# Patient Record
Sex: Female | Born: 1945 | Race: White | Hispanic: No | Smoking: Former smoker
Health system: Southern US, Community
[De-identification: ages and names within clinical notes are randomized; demographics above are authoritative.]

## PROBLEM LIST (undated history)

## (undated) DIAGNOSIS — Z9181 History of falling: Secondary | ICD-10-CM

## (undated) DIAGNOSIS — Z8719 Personal history of other diseases of the digestive system: Secondary | ICD-10-CM

## (undated) DIAGNOSIS — K5790 Diverticulosis of intestine, part unspecified, without perforation or abscess without bleeding: Secondary | ICD-10-CM

## (undated) DIAGNOSIS — F32A Depression, unspecified: Secondary | ICD-10-CM

## (undated) DIAGNOSIS — I509 Heart failure, unspecified: Secondary | ICD-10-CM

## (undated) DIAGNOSIS — M069 Rheumatoid arthritis, unspecified: Secondary | ICD-10-CM

## (undated) DIAGNOSIS — K759 Inflammatory liver disease, unspecified: Secondary | ICD-10-CM

## (undated) DIAGNOSIS — D649 Anemia, unspecified: Secondary | ICD-10-CM

## (undated) DIAGNOSIS — G510 Bell's palsy: Secondary | ICD-10-CM

## (undated) DIAGNOSIS — E7800 Pure hypercholesterolemia, unspecified: Secondary | ICD-10-CM

## (undated) DIAGNOSIS — M810 Age-related osteoporosis without current pathological fracture: Secondary | ICD-10-CM

## (undated) DIAGNOSIS — I10 Essential (primary) hypertension: Secondary | ICD-10-CM

## (undated) DIAGNOSIS — K219 Gastro-esophageal reflux disease without esophagitis: Secondary | ICD-10-CM

## (undated) DIAGNOSIS — R279 Unspecified lack of coordination: Secondary | ICD-10-CM

## (undated) DIAGNOSIS — M545 Low back pain, unspecified: Secondary | ICD-10-CM

## (undated) DIAGNOSIS — I639 Cerebral infarction, unspecified: Secondary | ICD-10-CM

## (undated) DIAGNOSIS — I219 Acute myocardial infarction, unspecified: Secondary | ICD-10-CM

## (undated) DIAGNOSIS — R269 Unspecified abnormalities of gait and mobility: Principal | ICD-10-CM

## (undated) DIAGNOSIS — R41841 Cognitive communication deficit: Secondary | ICD-10-CM

## (undated) DIAGNOSIS — N390 Urinary tract infection, site not specified: Secondary | ICD-10-CM

## (undated) DIAGNOSIS — R413 Other amnesia: Secondary | ICD-10-CM

## (undated) DIAGNOSIS — F329 Major depressive disorder, single episode, unspecified: Secondary | ICD-10-CM

## (undated) DIAGNOSIS — M1990 Unspecified osteoarthritis, unspecified site: Secondary | ICD-10-CM

## (undated) HISTORY — PX: APPENDECTOMY: SHX54

## (undated) HISTORY — PX: ESOPHAGEAL DILATION: SHX303

## (undated) HISTORY — PX: CHOLECYSTECTOMY: SHX55

## (undated) HISTORY — PX: CATARACT EXTRACTION: SUR2

## (undated) HISTORY — DX: Unspecified abnormalities of gait and mobility: R26.9

## (undated) HISTORY — PX: ABDOMINAL HYSTERECTOMY: SHX81

## (undated) HISTORY — PX: FRACTURE SURGERY: SHX138

## (undated) HISTORY — DX: Other amnesia: R41.3

## (undated) HISTORY — PX: TONSILLECTOMY: SUR1361

---

## 1999-11-04 ENCOUNTER — Encounter: Admission: RE | Admit: 1999-11-04 | Discharge: 2000-02-02 | Payer: Self-pay | Admitting: Internal Medicine

## 2000-03-05 ENCOUNTER — Encounter: Payer: Self-pay | Admitting: Gynecology

## 2000-03-05 ENCOUNTER — Encounter: Admission: RE | Admit: 2000-03-05 | Discharge: 2000-03-05 | Payer: Self-pay | Admitting: Gynecology

## 2001-03-22 ENCOUNTER — Other Ambulatory Visit: Admission: RE | Admit: 2001-03-22 | Discharge: 2001-03-22 | Payer: Self-pay | Admitting: Gynecology

## 2002-04-20 ENCOUNTER — Other Ambulatory Visit: Admission: RE | Admit: 2002-04-20 | Discharge: 2002-04-20 | Payer: Self-pay | Admitting: Gynecology

## 2002-11-15 ENCOUNTER — Encounter: Admission: RE | Admit: 2002-11-15 | Discharge: 2002-11-15 | Payer: Self-pay | Admitting: Rheumatology

## 2002-11-15 ENCOUNTER — Encounter: Payer: Self-pay | Admitting: Rheumatology

## 2002-11-17 ENCOUNTER — Ambulatory Visit (HOSPITAL_COMMUNITY): Admission: RE | Admit: 2002-11-17 | Discharge: 2002-11-17 | Payer: Self-pay | Admitting: Gastroenterology

## 2002-11-17 ENCOUNTER — Encounter (INDEPENDENT_AMBULATORY_CARE_PROVIDER_SITE_OTHER): Payer: Self-pay | Admitting: Specialist

## 2003-05-05 ENCOUNTER — Other Ambulatory Visit: Admission: RE | Admit: 2003-05-05 | Discharge: 2003-05-05 | Payer: Self-pay | Admitting: Gynecology

## 2003-06-01 ENCOUNTER — Encounter: Payer: Self-pay | Admitting: Gynecology

## 2003-06-01 ENCOUNTER — Encounter: Admission: RE | Admit: 2003-06-01 | Discharge: 2003-06-01 | Payer: Self-pay | Admitting: Gynecology

## 2004-03-21 ENCOUNTER — Ambulatory Visit (HOSPITAL_COMMUNITY): Admission: RE | Admit: 2004-03-21 | Discharge: 2004-03-21 | Payer: Self-pay | Admitting: Neurology

## 2004-03-27 ENCOUNTER — Ambulatory Visit (HOSPITAL_COMMUNITY): Admission: RE | Admit: 2004-03-27 | Discharge: 2004-03-27 | Payer: Self-pay | Admitting: Neurology

## 2004-05-06 ENCOUNTER — Other Ambulatory Visit: Admission: RE | Admit: 2004-05-06 | Discharge: 2004-05-06 | Payer: Self-pay | Admitting: Gynecology

## 2005-04-10 ENCOUNTER — Encounter: Admission: RE | Admit: 2005-04-10 | Discharge: 2005-04-10 | Payer: Self-pay | Admitting: Gynecology

## 2005-05-07 ENCOUNTER — Other Ambulatory Visit: Admission: RE | Admit: 2005-05-07 | Discharge: 2005-05-07 | Payer: Self-pay | Admitting: Gynecology

## 2006-04-14 ENCOUNTER — Encounter: Admission: RE | Admit: 2006-04-14 | Discharge: 2006-04-14 | Payer: Self-pay | Admitting: Internal Medicine

## 2006-07-30 ENCOUNTER — Other Ambulatory Visit: Admission: RE | Admit: 2006-07-30 | Discharge: 2006-07-30 | Payer: Self-pay | Admitting: Gynecology

## 2007-07-26 ENCOUNTER — Encounter: Admission: RE | Admit: 2007-07-26 | Discharge: 2007-07-26 | Payer: Self-pay | Admitting: Internal Medicine

## 2007-08-09 ENCOUNTER — Other Ambulatory Visit: Admission: RE | Admit: 2007-08-09 | Discharge: 2007-08-09 | Payer: Self-pay | Admitting: Gynecology

## 2007-09-23 ENCOUNTER — Encounter: Admission: RE | Admit: 2007-09-23 | Discharge: 2007-09-23 | Payer: Self-pay | Admitting: Internal Medicine

## 2007-10-04 ENCOUNTER — Encounter: Admission: RE | Admit: 2007-10-04 | Discharge: 2007-10-04 | Payer: Self-pay | Admitting: Family Medicine

## 2007-10-27 ENCOUNTER — Ambulatory Visit (HOSPITAL_COMMUNITY): Admission: RE | Admit: 2007-10-27 | Discharge: 2007-10-27 | Payer: Self-pay | Admitting: Internal Medicine

## 2008-07-03 ENCOUNTER — Ambulatory Visit (HOSPITAL_BASED_OUTPATIENT_CLINIC_OR_DEPARTMENT_OTHER): Admission: RE | Admit: 2008-07-03 | Discharge: 2008-07-03 | Payer: Self-pay | Admitting: *Deleted

## 2008-07-06 ENCOUNTER — Inpatient Hospital Stay (HOSPITAL_BASED_OUTPATIENT_CLINIC_OR_DEPARTMENT_OTHER): Admission: RE | Admit: 2008-07-06 | Discharge: 2008-07-06 | Payer: Self-pay | Admitting: *Deleted

## 2008-09-25 ENCOUNTER — Encounter: Admission: RE | Admit: 2008-09-25 | Discharge: 2008-09-25 | Payer: Self-pay | Admitting: Internal Medicine

## 2010-01-08 ENCOUNTER — Encounter: Admission: RE | Admit: 2010-01-08 | Discharge: 2010-01-08 | Payer: Self-pay | Admitting: Internal Medicine

## 2010-06-22 ENCOUNTER — Emergency Department (HOSPITAL_COMMUNITY): Admission: EM | Admit: 2010-06-22 | Discharge: 2010-06-22 | Payer: Self-pay | Admitting: Emergency Medicine

## 2010-06-25 ENCOUNTER — Encounter: Admission: RE | Admit: 2010-06-25 | Discharge: 2010-06-25 | Payer: Self-pay | Admitting: Internal Medicine

## 2011-01-07 NOTE — Cardiovascular Report (Signed)
NAME:  Tracey Mason, Tracey Mason NO.:  [id number]   MEDICAL RECORD NO.:  [id number]          PATIENT TYPE:  OIB   LOCATION:  1961                         FACILITY:  [facility]   PHYSICIAN:  Elmore Guise., M.D.DATE OF BIRTH:  [birth year]   DATE OF PROCEDURE:  07/06/2008  DATE OF DISCHARGE:                            CARDIAC CATHETERIZATION   INDICATIONS FOR PROCEDURE:  Equivocal stress test, abnormal EKG, new  onset chest pain.   HISTORY OF PRESENT ILLNESS:  Tracey Mason is a very pleasant 65 year old  white female with past medical history of diabetes mellitus,  hypertension, dyslipidemia, tobacco dependence who presented for  evaluation of chest pain.  The patient had an equivocal stress test back  in 2004.  Her ECG was abnormal.  Because of her risk factors and  symptoms, she was referred for a cardiac catheterization.   DESCRIPTION OF PROCEDURE:  The patient presented to the cath lab in a  fasting state.  After appropriate informed consent, she was prepped and  draped in sterile fashion.  Approximately 10 mL of 1% lidocaine was used  for local anesthesia.  A 4-French sheath was placed in the right femoral  artery without difficulty.  Coronary angiography, LV angiography were  then performed.  The patient tolerated the procedure well with no  apparent complications.  The patient was transferred from the cardiac  cath lab in stable condition.   FINDINGS:  1. Left main normal.  2. LAD:  Moderate-sized vessel with mild luminal irregularities.  3. D1/D2:  Small vessels with mild luminal irregularities.  4. LCX:  Nondominant with mild luminal irregularities.  5. OM-1/OM-2:  Small-to-moderate-sized vessels with mild luminal      irregularities.  6. Ramus intermedius:  Moderate-sized vessel with mild luminal      irregularities.  7. RCA:  Dominant with mild luminal irregularities.  8. PDA and PLV:  Patent with mild luminal irregularities.  9. LV:  EF is 65%.  No wall  motion abnormalities.  LVEDP is 26 mmHg.   IMPRESSION:  1. Nonobstructive coronary arteries with mild luminal irregularities.  2. Preserved left ventricular systolic function, ejection fraction is      65% with no wall motion abnormalities.   PLAN:  At this time, I would recommend continued aggressive risk factor  modification as indicated.      Elmore Guise., M.D.  Electronically Signed    TWK/MEDQ  D:  07/06/2008  T:  07/06/2008  Job:  [id number]   cc:   Geoffry Paradise, M.D.

## 2011-01-10 NOTE — Op Note (Signed)
   NAME:  Tracey Mason, Tracey Mason                       ACCOUNT NO.:  [id number]   MEDICAL RECORD NO.:  [id number]                   PATIENT TYPE:  AMB   LOCATION:  ENDO                                 FACILITY:  [facility]   PHYSICIAN:  John C. Madilyn Fireman, M.D.                 DATE OF BIRTH:  09/25/1945   DATE OF PROCEDURE:  11/17/2002  DATE OF DISCHARGE:                                 OPERATIVE REPORT   PROCEDURE:  Colonoscopy with polypectomy.   INDICATION FOR PROCEDURE:  Family history of colon cancer in a first-degree  relative.   DESCRIPTION OF PROCEDURE:  The patient was placed in the left lateral  decubitus position and placed on the pulse monitor with continuous low-flow  oxygen delivered by nasal cannula.  She was sedated with 25 mcg IV fentanyl  and 2 mg IV Versed in addition to the medicine given for the previous EGD.  The Olympus video colonoscope was inserted into the rectum and advanced to  the cecum, confirmed by transillumination at McBurney's point and  visualization of the ileocecal valve and appendiceal orifice.  The prep was  excellent.  The cecum, ascending, transverse, descending, and sigmoid colon  all appeared normal with no masses, polyps, diverticula, or other mucosal  abnormalities.  Within the rectum, there was seen a 6 mm polyp at  approximately 12 cm from the anal verge, and this was fulgurated by hot  biopsy.  The remainder of the rectum appeared normal.  The colonoscope was  then withdrawn and the patient returned to the recovery room in stable  condition.  She tolerated the procedure well, and there were no immediate  complications.   IMPRESSION:  1. Small rectal polyp.  2. Otherwise, normal study.   PLAN:  Await histology and will repeat colonoscopy in five years.                                               John C. Madilyn Fireman, M.D.    JCH/MEDQ  D:  11/17/2002  T:  11/17/2002  Job:  [id number]   cc:   Geoffry Paradise, M.D.  [address]  [place]  [place]  [address]  Fax: [phone]

## 2011-01-10 NOTE — Op Note (Signed)
NAME:  Tracey Mason, Tracey Mason                       ACCOUNT NO.:  [id number]   MEDICAL RECORD NO.:  [id number]                   PATIENT TYPE:  AMB   LOCATION:  ENDO                                 [facility]ACILITY:  [facility]   PHYSICIAN:  John C. Madilyn Fireman, M.D.                 DATE OF BIRTH:  08-26-1945   DATE OF PROCEDURE:  11/17/2002  DATE OF DISCHARGE:                                 OPERATIVE REPORT   PROCEDURE:  Esophagogastroduodenoscopy.   INDICATION FOR PROCEDURE:  The patient undergoing colonoscopy for family  history of colon cancer who also has a family history of stomach cancer and  has had some dyspeptic symptoms and is known to have a lower esophageal  ring.  She has also had some intermittent dysphagia.  Procedure is to assess  for mucosal abnormalities of the upper GI tract, screen for malignancy, and  consider esophageal dilatation.   DESCRIPTION OF PROCEDURE:  The patient was placed in the left lateral  decubitus position and placed on the pulse monitor with continuous low-flow  oxygen delivered by nasal cannula.  She was sedated with 50 mg IV Demerol  and 5 mg IV Versed.  The Olympus video endoscope was advanced under direct  vision into the oropharynx and esophagus.  The esophagus was straight and of  normal caliber with the squamocolumnar line at 38 cm above a small 1 cm  hiatal hernia.  There did appear to be a very subtle lower esophageal ring  that could be seen when the LES was completely relaxed.  There was no  resistance to passage of the scope beyond it.  The stomach was entered, and  a small amount of liquid secretions were suctioned from the fundus.  Retroflexed view of the cardia was unremarkable.  The fundus and body  appeared normal.  The antrum showed a few patches of erythema and  granularity distally, consistent with mild gastritis with no focal erosions  or ulcers.  CLOtest was obtained.  The pylorus was nondeformed and easily  allowed passage of the endoscope  tip into the duodenum.  Both the bulb and  second portion were well-inspected and appeared to be within normal limits.  The Savary guidewire was placed through the endoscope channel and the scope  withdrawn.  A single 17 mm Savary dilator was passed over the guidewire with  minimal resistance and removed together with the wire.  There was no blood  seen on the dilator.  The patient was then prepared for colonoscopy.  She  tolerated the procedure well, and there were no immediate complications.   IMPRESSION:  1. 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  2. Mild gastritis.    PLAN:  1. Advance diet and observe response to dilatation.  2. We will await CLOtest and treat for eradication if Helicobacter positive     given her family history of gastric cancer.  John C. Madilyn Fireman, M.D.    JCH/MEDQ  D:  11/17/2002  T:  11/17/2002  Job:  [id number]   cc:   Geoffry Paradise, M.D.  [address]  [place]  [place] [address]  Fax: [phone]

## 2011-05-27 LAB — POCT I-STAT GLUCOSE
Glucose, Bld: 220 — ABNORMAL HIGH
Operator id: 141321

## 2011-08-04 ENCOUNTER — Encounter: Payer: Self-pay | Admitting: Emergency Medicine

## 2011-08-04 ENCOUNTER — Inpatient Hospital Stay (HOSPITAL_COMMUNITY)
Admission: AD | Admit: 2011-08-04 | Discharge: 2011-08-08 | DRG: 885 | Disposition: A | Discharge status: Home / Self Care | Payer: No Typology Code available for payment source | Source: Ambulatory Visit | Attending: Psychiatry | Admitting: Psychiatry

## 2011-08-04 ENCOUNTER — Encounter (HOSPITAL_COMMUNITY): Payer: Self-pay | Admitting: *Deleted

## 2011-08-04 ENCOUNTER — Emergency Department (HOSPITAL_COMMUNITY)
Admission: EM | Admit: 2011-08-04 | Discharge: 2011-08-04 | Disposition: A | Discharge status: Other Acute Inpatient Hospital | Payer: Medicare Other | Attending: Emergency Medicine | Admitting: Emergency Medicine

## 2011-08-04 DIAGNOSIS — M1990 Unspecified osteoarthritis, unspecified site: Secondary | ICD-10-CM | POA: Insufficient documentation

## 2011-08-04 DIAGNOSIS — Z8673 Personal history of transient ischemic attack (TIA), and cerebral infarction without residual deficits: Secondary | ICD-10-CM

## 2011-08-04 DIAGNOSIS — E119 Type 2 diabetes mellitus without complications: Secondary | ICD-10-CM | POA: Insufficient documentation

## 2011-08-04 DIAGNOSIS — R45851 Suicidal ideations: Secondary | ICD-10-CM

## 2011-08-04 DIAGNOSIS — K219 Gastro-esophageal reflux disease without esophagitis: Secondary | ICD-10-CM

## 2011-08-04 DIAGNOSIS — Z79899 Other long term (current) drug therapy: Secondary | ICD-10-CM

## 2011-08-04 DIAGNOSIS — I10 Essential (primary) hypertension: Secondary | ICD-10-CM

## 2011-08-04 DIAGNOSIS — M069 Rheumatoid arthritis, unspecified: Secondary | ICD-10-CM

## 2011-08-04 DIAGNOSIS — Z888 Allergy status to other drugs, medicaments and biological substances status: Secondary | ICD-10-CM

## 2011-08-04 DIAGNOSIS — IMO0002 Reserved for concepts with insufficient information to code with codable children: Secondary | ICD-10-CM

## 2011-08-04 DIAGNOSIS — K5730 Diverticulosis of large intestine without perforation or abscess without bleeding: Secondary | ICD-10-CM

## 2011-08-04 DIAGNOSIS — G4700 Insomnia, unspecified: Secondary | ICD-10-CM

## 2011-08-04 DIAGNOSIS — F332 Major depressive disorder, recurrent severe without psychotic features: Principal | ICD-10-CM

## 2011-08-04 DIAGNOSIS — F3289 Other specified depressive episodes: Secondary | ICD-10-CM | POA: Insufficient documentation

## 2011-08-04 DIAGNOSIS — Z7982 Long term (current) use of aspirin: Secondary | ICD-10-CM

## 2011-08-04 DIAGNOSIS — Z794 Long term (current) use of insulin: Secondary | ICD-10-CM | POA: Insufficient documentation

## 2011-08-04 DIAGNOSIS — F329 Major depressive disorder, single episode, unspecified: Secondary | ICD-10-CM

## 2011-08-04 HISTORY — DX: Major depressive disorder, single episode, unspecified: F32.9

## 2011-08-04 HISTORY — DX: Cerebral infarction, unspecified: I63.9

## 2011-08-04 HISTORY — DX: Essential (primary) hypertension: I10

## 2011-08-04 HISTORY — DX: Depression, unspecified: F32.A

## 2011-08-04 HISTORY — DX: Unspecified osteoarthritis, unspecified site: M19.90

## 2011-08-04 HISTORY — DX: Rheumatoid arthritis, unspecified: M06.9

## 2011-08-04 HISTORY — DX: Diverticulosis of intestine, part unspecified, without perforation or abscess without bleeding: K57.90

## 2011-08-04 HISTORY — DX: Gastro-esophageal reflux disease without esophagitis: K21.9

## 2011-08-04 HISTORY — DX: Bell's palsy: G51.0

## 2011-08-04 LAB — COMPREHENSIVE METABOLIC PANEL
ALT: 23 U/L (ref 0–35)
Albumin: 4.3 g/dL (ref 3.5–5.2)
CO2: 25 mEq/L (ref 19–32)
Chloride: 106 mEq/L (ref 96–112)
GFR calc non Af Amer: 75 mL/min — ABNORMAL LOW (ref >90)
Glucose, Bld: 101 mg/dL — ABNORMAL HIGH (ref 70–99)
Sodium: 143 mEq/L (ref 135–145)
Total Bilirubin: 0.4 mg/dL (ref 0.3–1.2)

## 2011-08-04 LAB — CBC
Hemoglobin: 12.8 g/dL (ref 12.0–15.0)
Platelets: 236 10*3/uL (ref 150–400)
RBC: 4.04 MIL/uL (ref 3.87–5.11)
RDW: 12.4 % (ref 11.5–15.5)
WBC: 8.4 10*3/uL (ref 4.0–10.5)

## 2011-08-04 LAB — URINALYSIS, ROUTINE W REFLEX MICROSCOPIC
Glucose, UA: NEGATIVE mg/dL
Hgb urine dipstick: NEGATIVE
pH: 6.5 (ref 5.0–8.0)

## 2011-08-04 LAB — RAPID URINE DRUG SCREEN, HOSP PERFORMED
Benzodiazepines: NOT DETECTED
Cocaine: NOT DETECTED
Tetrahydrocannabinol: NOT DETECTED

## 2011-08-04 LAB — ETHANOL: Alcohol, Ethyl (B): <11 mg/dL (ref 0–11)

## 2011-08-04 LAB — GLUCOSE, CAPILLARY: Glucose-Capillary: 78 mg/dL (ref 70–99)

## 2011-08-04 MED ORDER — LORAZEPAM 1 MG PO TABS
1.0000 mg | ORAL_TABLET | Freq: Three times a day (TID) | ORAL | Status: DC | PRN
  Administered 2011-08-04: 1 mg via ORAL
  Filled 2011-08-04: qty 1

## 2011-08-04 MED ORDER — ACETAMINOPHEN 325 MG PO TABS
650.0000 mg | ORAL_TABLET | ORAL | Status: DC | PRN
  Administered 2011-08-04: 650 mg via ORAL
  Filled 2011-08-04: qty 2

## 2011-08-04 NOTE — ED Notes (Signed)
Pt in no distress. Sitter at bedside. Pt states no plan of suicide but feels like she would be better off dead. Daughter at bedside.
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
## 2011-08-04 NOTE — ED Notes (Addendum)
Pt states that she has no pain and is much calmer after medications. She said she did have a headache before we medicated her and now it is gone. Pt is with sitter and daughter is at bedside. Pt given caffiene/sugar free soda

## 2011-08-04 NOTE — ED Notes (Signed)
Per family pt has been hysterical and threatens to hurt herself and doesn't want to live  Is a diabetic and she has hx of  depression

## 2011-08-04 NOTE — ED Provider Notes (Signed)
History     CSN: [id number] Arrival date & time: 08/04/2011  2:47 PM   First MD Initiated Contact with Patient 08/04/11 1510      Chief Complaint  Patient presents with  . Depression    (Consider location/radiation/quality/duration/timing/severity/associated sxs/prior treatment) The history is provided by the patient and a relative.  pt with hx depression is feeling more depressed in past couple weeks. Now having thoughts of suicide, not wanting to live. States spouse died 2 yrs ago, and grandchild yrs ago, states feels worse this time of year. Had been on cymbalta in past with some improvement in feelings of depression, but took self off med a year ago. Had a psychiatrist previous, but says that they have retired. Denies any recent acute health problems or other symptoms. No overdose or attempt at self harm. Trouble sleeping at night, often up to 3-4 am. Has withdrawn, quit going to church. Is eating and drinking, taking meds,  'going through the motions to live'.   Past Medical History  Diagnosis Date  . Diabetes mellitus   . Depression   . Hypertension   . Acid reflux   . Diverticular disease   . Rheumatoid arthritis   . Osteoarthritis   . Stroke   . Bell's palsy     Past Surgical History  Procedure Date  . Appendectomy   . Tonsillectomy   . Cholecystectomy   . Abdominal hysterectomy   . Fracture surgery     No family history on file.  History  Substance Use Topics  . Smoking status: Former Games developer  . Smokeless tobacco: Not on file  . Alcohol Use: Yes    OB History    Grav Para Term Preterm Abortions TAB SAB Ect Mult Living                  Review of Systems  Constitutional: Negative for fever and chills.  HENT: Negative for neck pain.   Respiratory: Negative for shortness of breath.   Cardiovascular: Negative for chest pain.  Gastrointestinal: Negative for abdominal pain.  Genitourinary: Negative for flank pain.  Musculoskeletal: Negative for back  pain.  Skin: Negative for rash.  Neurological: Negative for headaches.  Hematological: Does not bruise/bleed easily.  Psychiatric/Behavioral: Negative for hallucinations.    Allergies  Prednisone  Home Medications   Current Outpatient Rx  Name Route Sig Dispense Refill  . AMLODIPINE BESYLATE 5 MG PO TABS Oral Take 5 mg by mouth daily.      Marland Kitchen VITAMIN C 1000 MG PO TABS Oral Take 1,000 mg by mouth daily.      . ASPIRIN 81 MG PO TABS Oral Take 81 mg by mouth daily.      Marland Kitchen CALCIUM CITRATE-VITAMIN D 200-200 MG-UNIT PO TABS Oral Take 1 tablet by mouth 2 (two) times daily.      Marland Kitchen VITAMIN D 1000 UNITS PO TABS Oral Take 1,000 Units by mouth daily.      . INSULIN GLARGINE 100 UNIT/ML [place] SOLN Subcutaneous Inject 50 Units into the skin at bedtime.      . INSULIN GLULISINE 100 UNIT/ML [place] SOLN Subcutaneous Inject 10-30 Units into the skin 3 (three) times daily before meals. Sliding scale     . LISINOPRIL 20 MG PO TABS Oral Take 20 mg by mouth daily.      . MELOXICAM 15 MG PO TABS Oral Take 15 mg by mouth daily.      Marland Kitchen PANTOPRAZOLE SODIUM 40 MG PO TBEC Oral Take 40  mg by mouth daily.      Marland Kitchen SIMVASTATIN 40 MG PO TABS Oral Take 40 mg by mouth daily.        BP 179/102  Pulse 103  Temp(Src) 98 F (36.7 C) (Oral)  Resp 22  SpO2 98%  Physical Exam  Nursing note and vitals reviewed. Constitutional: She is oriented to person, place, and time. She appears well-developed and well-nourished. No distress.  HENT:  Head: Atraumatic.  Eyes: Conjunctivae are normal. Pupils are equal, round, and reactive to light. No scleral icterus.  Neck: Neck supple. No tracheal deviation present.  Cardiovascular: Normal rate, regular rhythm, normal heart sounds and intact distal pulses.   Pulmonary/Chest: Effort normal and breath sounds normal. No respiratory distress.  Abdominal: Soft. Normal appearance. There is no tenderness.  Musculoskeletal: She exhibits no edema.  Neurological: She is alert and oriented to  person, place, and time.       Steady gait  Skin: Skin is warm and dry. No rash noted.  Psychiatric:       Depressed. Tearful. +suicidal thoughts.     ED Course  Procedures (including critical care time)   Results for orders placed during the hospital encounter of 08/04/11  CBC      Component Value Range   WBC 8.4  4.0 - 10.5 (K/uL)   RBC 4.04  3.87 - 5.11 (MIL/uL)   Hemoglobin 12.8  12.0 - 15.0 (g/dL)   HCT 16.1  09.6 - 04.5 (%)   MCV 89.9  78.0 - 100.0 (fL)   MCH 31.7  26.0 - 34.0 (pg)   MCHC 35.3  30.0 - 36.0 (g/dL)   RDW 40.9  81.1 - 91.4 (%)   Platelets 236  150 - 400 (K/uL)  COMPREHENSIVE METABOLIC PANEL      Component Value Range   Sodium 143  135 - 145 (mEq/L)   Potassium 3.8  3.5 - 5.1 (mEq/L)   Chloride 106  96 - 112 (mEq/L)   CO2 25  19 - 32 (mEq/L)   Glucose, Bld 101 (*) 70 - 99 (mg/dL)   BUN 15  6 - 23 (mg/dL)   Creatinine, Ser 7.82  0.50 - 1.10 (mg/dL)   Calcium 95.6  8.4 - 10.5 (mg/dL)   Total Protein 8.1  6.0 - 8.3 (g/dL)   Albumin 4.3  3.5 - 5.2 (g/dL)   AST 22  0 - 37 (U/L)   ALT 23  0 - 35 (U/L)   Alkaline Phosphatase 100  39 - 117 (U/L)   Total Bilirubin 0.4  0.3 - 1.2 (mg/dL)   GFR calc non Af Amer 75 (*) >90 (mL/min)   GFR calc Af Amer 87 (*) >90 (mL/min)  URINALYSIS, ROUTINE W REFLEX MICROSCOPIC      Component Value Range   Color, Urine YELLOW  YELLOW    APPearance HAZY (*) CLEAR    Specific Gravity, Urine 1.006  1.005 - 1.030    pH 6.5  5.0 - 8.0    Glucose, UA NEGATIVE  NEGATIVE (mg/dL)   Hgb urine dipstick NEGATIVE  NEGATIVE    Bilirubin Urine NEGATIVE  NEGATIVE    Ketones, ur NEGATIVE  NEGATIVE (mg/dL)   Protein, ur NEGATIVE  NEGATIVE (mg/dL)   Urobilinogen, UA 0.2  0.0 - 1.0 (mg/dL)   Nitrite NEGATIVE  NEGATIVE    Leukocytes, UA SMALL (*) NEGATIVE   URINE RAPID DRUG SCREEN (HOSP PERFORMED)      Component Value Range   Opiates NONE DETECTED  NONE  DETECTED    Cocaine NONE DETECTED  NONE DETECTED    Benzodiazepines NONE DETECTED   NONE DETECTED    Amphetamines NONE DETECTED  NONE DETECTED    Tetrahydrocannabinol NONE DETECTED  NONE DETECTED    Barbiturates NONE DETECTED  NONE DETECTED   ETHANOL      Component Value Range   Alcohol, Ethyl (B) <11  0 - 11 (mg/dL)  URINE MICROSCOPIC-ADD ON      Component Value Range   Squamous Epithelial / LPF RARE  RARE    WBC, UA 0-2  <3 (WBC/hpf)   Bacteria, UA MANY (*) RARE   GLUCOSE, CAPILLARY      Component Value Range   Glucose-Capillary 78  70 - 99 (mg/dL)   No results found.    MDM  Labs sent. [facility] team called.    Act team indicates pt accepted at [facility], dr Rogers Blocker for walker, bed ready    Suzi Roots, MD 08/04/11 2011

## 2011-08-05 DIAGNOSIS — E119 Type 2 diabetes mellitus without complications: Secondary | ICD-10-CM

## 2011-08-05 LAB — GLUCOSE, CAPILLARY
Glucose-Capillary: 177 mg/dL — ABNORMAL HIGH (ref 70–99)
Glucose-Capillary: 252 mg/dL — ABNORMAL HIGH (ref 70–99)
Glucose-Capillary: 232 mg/dL — ABNORMAL HIGH (ref 70–99)

## 2011-08-05 MED ORDER — MELOXICAM 7.5 MG PO TABS
15.0000 mg | ORAL_TABLET | Freq: Every day | ORAL | Status: DC
  Administered 2011-08-05 – 2011-08-08 (×4): 15 mg via ORAL
  Filled 2011-08-05: qty 1
  Filled 2011-08-05: qty 14
  Filled 2011-08-05 (×4): qty 1

## 2011-08-05 MED ORDER — ACETAMINOPHEN 325 MG PO TABS: 650.0000 mg | ORAL_TABLET | Freq: Four times a day (QID) | ORAL | Status: DC | PRN

## 2011-08-05 MED ORDER — CALCIUM CARBONATE-VITAMIN D 250-125 MG-UNIT PO TABS
1.0000 | ORAL_TABLET | Freq: Every day | ORAL | Status: DC
  Administered 2011-08-05 – 2011-08-08 (×4): 1 via ORAL
  Filled 2011-08-05: qty 7
  Filled 2011-08-05 (×5): qty 1

## 2011-08-05 MED ORDER — TRAZODONE HCL 50 MG PO TABS
50.0000 mg | ORAL_TABLET | Freq: Every evening | ORAL | Status: DC | PRN
  Administered 2011-08-05 (×2): 50 mg via ORAL
  Filled 2011-08-05: qty 1
  Filled 2011-08-05: qty 7
  Filled 2011-08-05: qty 1

## 2011-08-05 MED ORDER — MAGNESIUM HYDROXIDE 400 MG/5ML PO SUSP: 30.0000 mL | Freq: Every day | ORAL | Status: DC | PRN

## 2011-08-05 MED ORDER — PANTOPRAZOLE SODIUM 40 MG PO TBEC
40.0000 mg | DELAYED_RELEASE_TABLET | Freq: Every day | ORAL | Status: DC
  Administered 2011-08-05 – 2011-08-08 (×4): 40 mg via ORAL
  Filled 2011-08-05 (×2): qty 1
  Filled 2011-08-05: qty 7
  Filled 2011-08-05 (×2): qty 1

## 2011-08-05 MED ORDER — AMLODIPINE BESYLATE 5 MG PO TABS
5.0000 mg | ORAL_TABLET | Freq: Every day | ORAL | Status: DC
  Administered 2011-08-05 – 2011-08-08 (×4): 5 mg via ORAL
  Filled 2011-08-05 (×3): qty 1
  Filled 2011-08-05: qty 14
  Filled 2011-08-05: qty 1

## 2011-08-05 MED ORDER — ASPIRIN EC 81 MG PO TBEC
81.0000 mg | DELAYED_RELEASE_TABLET | Freq: Every day | ORAL | Status: DC
  Administered 2011-08-05 – 2011-08-08 (×4): 81 mg via ORAL
  Filled 2011-08-05 (×2): qty 1
  Filled 2011-08-05: qty 7
  Filled 2011-08-05 (×2): qty 1

## 2011-08-05 MED ORDER — VITAMIN C 500 MG PO TABS
1000.0000 mg | ORAL_TABLET | Freq: Every day | ORAL | Status: DC
  Administered 2011-08-05 – 2011-08-08 (×4): 1000 mg via ORAL
  Filled 2011-08-05 (×2): qty 2
  Filled 2011-08-05: qty 14
  Filled 2011-08-05 (×2): qty 2

## 2011-08-05 MED ORDER — INSULIN GLARGINE 100 UNIT/ML ~~LOC~~ SOLN
50.0000 [IU] | Freq: Every day | SUBCUTANEOUS | Status: DC
  Administered 2011-08-05 – 2011-08-07 (×4): 50 [IU] via SUBCUTANEOUS
  Filled 2011-08-05: qty 3

## 2011-08-05 MED ORDER — DULOXETINE HCL 30 MG PO CPEP
30.0000 mg | ORAL_CAPSULE | Freq: Every day | ORAL | Status: DC
  Administered 2011-08-05 – 2011-08-08 (×4): 30 mg via ORAL
  Filled 2011-08-05 (×3): qty 1
  Filled 2011-08-05: qty 7
  Filled 2011-08-05 (×2): qty 1

## 2011-08-05 MED ORDER — ALUM & MAG HYDROXIDE-SIMETH 200-200-20 MG/5ML PO SUSP: 30.0000 mL | ORAL | Status: DC | PRN

## 2011-08-05 MED ORDER — VITAMIN D3 25 MCG (1000 UNIT) PO TABS
1000.0000 [IU] | ORAL_TABLET | Freq: Every day | ORAL | Status: DC
  Administered 2011-08-05 – 2011-08-08 (×4): 1000 [IU] via ORAL
  Filled 2011-08-05: qty 1
  Filled 2011-08-05: qty 7
  Filled 2011-08-05 (×4): qty 1

## 2011-08-05 MED ORDER — LISINOPRIL 20 MG PO TABS
20.0000 mg | ORAL_TABLET | Freq: Every day | ORAL | Status: DC
  Administered 2011-08-05 – 2011-08-08 (×4): 20 mg via ORAL
  Filled 2011-08-05: qty 7
  Filled 2011-08-05 (×4): qty 1

## 2011-08-05 MED ORDER — SIMVASTATIN 40 MG PO TABS
40.0000 mg | ORAL_TABLET | Freq: Every day | ORAL | Status: DC
  Administered 2011-08-05 – 2011-08-08 (×4): 40 mg via ORAL
  Filled 2011-08-05: qty 7
  Filled 2011-08-05 (×4): qty 1

## 2011-08-05 NOTE — H&P (Signed)
Psychiatric Admission Assessment Adult  Patient Identification:  Tracey Mason Date of Evaluation:  08/05/2011 Chief Complaint:  MDD, Recurrent, Severe  History of Present Illness: Ms. Tracey Mason is a 65 year old Caucasian female. She  Is admitted from [facility] ED. Patient reports, "I did not want to live any more. I did not see any sense in going on. I lost my newborn grandson in 2008-01-20. He was only 74 weeks old. The only son for one of my sons, whose wife can't have any more children due to a terrible illness. This baby was born healthy, some how, some milk got into his lungs, he caught pneumonia and died.  In 01-19-2010, I lost my husband from lung cancer after 35 years of a wonderful marriage. He was the love of my life. I miss him a lot. I also have a lot of stress in my life, a lot of health issues also. I got tired of hurting.  I stopped taking all my medicines, and here I am"  Mood Symptoms:  Anhedonia Depression Helplessness Hopelessness Past 2 Weeks Sadness SI Depression Symptoms:  depressed mood, anhedonia, insomnia, feelings of worthlessness/guilt, hopelessness, recurrent thoughts of death, suicidal thoughts without plan and insomnia ( Elevated Mood:  No Irritable Mood:  Yes Grandiosity:  No Distractibility:  No Labiality of Mood:  No Delusions:  No Hallucinations:  No Impulsivity:  No Sexually Inappropriate Behavior:  No Financial Extravagance:  No Flight of Ideas:  No  Anxiety Symptoms: Excessive Worry:  Yes Panic Symptoms:  No Agoraphobia:  No Obsessive Compulsive: No  Symptoms: None Specific Phobias:  No Social Anxiety:  No  Psychotic Symptoms:  Hallucinations:  None Delusions:  No Paranoia:  No   Ideas of Reference:  No  PTSD Symptoms: Ever had a traumatic exposure:  No Had a traumatic exposure in the last month:  No Re-experiencing:  None Hypervigilance:  No Hyperarousal:  None Avoidance:  None  Traumatic Brain Injury:  None reported  Past  Psychiatric History: Diagnosis:Depression, major  Hospitalizations:[facility]  Outpatient Care: [facility]  Substance Abuse Care:None reported  Self-Mutilation:None  Suicidal Attempts:None, but is with Hx. SI  Violent Behaviors:None reported   Past Medical History:   Past Medical History  Diagnosis Date  . Diabetes mellitus   . Depression   . Hypertension   . Acid reflux   . Diverticular disease   . Rheumatoid arthritis   . Osteoarthritis   . Stroke   . Bell's palsy    History of Loss of Consciousness:  No Seizure History:  No Cardiac History:  Yes  ROS: Reviewed and noted ROS from the ED. Allergies:   Allergies  Allergen Reactions  . Prednisone Other (See Comments)    Causes stroke   Current Medications:  Current Facility-Administered Medications  Medication Dose Route Frequency Provider Last Rate Last Dose  . acetaminophen (TYLENOL) tablet 650 mg  650 mg Oral Q6H PRN Verne Spurr, PA      . alum & mag hydroxide-simeth (MAALOX/MYLANTA) 200-200-20 MG/5ML suspension 30 mL  30 mL Oral Q4H PRN Verne Spurr, PA      . amLODipine (NORVASC) tablet 5 mg  5 mg Oral Daily Verne Spurr, PA   5 mg at 08/05/11 0826  . aspirin EC tablet 81 mg  81 mg Oral Daily Verne Spurr, PA   81 mg at 08/05/11 0827  . calcium-vitamin D (OSCAL WITH D) 250-125 MG-UNIT per tablet 1 tablet  1 tablet Oral Daily Verne Spurr, [place]   1  tablet at 08/05/11 0827  . cholecalciferol (VITAMIN D) tablet 1,000 Units  1,000 Units Oral Daily Verne Spurr, PA   1,000 Units at 08/05/11 [phone]  . insulin glargine (LANTUS) injection 50 Units  50 Units Subcutaneous QHS Verne Spurr, [place]   50 Units at 08/05/11 0204  . lisinopril (PRINIVIL,ZESTRIL) tablet 20 mg  20 mg Oral Daily Verne Spurr, [place]   20 mg at 08/05/11 9604  . magnesium hydroxide (MILK OF MAGNESIA) suspension 30 mL  30 mL Oral Daily PRN Verne Spurr, PA      . meloxicam (MOBIC) tablet 15 mg  15 mg Oral Daily Verne Spurr, [place]   15 mg at 08/05/11 5409  .  pantoprazole (PROTONIX) EC tablet 40 mg  40 mg Oral Q1200 Verne Spurr, PA   40 mg at 08/05/11 1155  . simvastatin (ZOCOR) tablet 40 mg  40 mg Oral q1800 Verne Spurr, PA      . traZODone (DESYREL) tablet 50 mg  50 mg Oral QHS PRN Verne Spurr, PA   50 mg at 08/05/11 0206  . vitamin C (ASCORBIC ACID) tablet 1,000 mg  1,000 mg Oral Daily Verne Spurr, PA   1,000 mg at 08/05/11 0830   Facility-Administered Medications Ordered in Other Encounters  Medication Dose Route Frequency Provider Last Rate Last Dose  . DISCONTD: acetaminophen (TYLENOL) tablet 650 mg  650 mg Oral Q4H PRN Suzi Roots, MD   650 mg at 08/04/11 1827  . DISCONTD: LORazepam (ATIVAN) tablet 1 mg  1 mg Oral Q8H PRN Suzi Roots, MD   1 mg at 08/04/11 1826    Previous Psychotropic Medications:  Medication Dose                        Substance Abuse History in the last 12 months: Substance Age of 1st Use Last Use Amount Specific Type  Nicotine "I quit smoking"     Alcohol "I drink some alcohol"     Cannabis Denies use     Opiates Denies use     Cocaine Denies use     Methamphetamines Denies use     LSD Denies use     Ecstasy Denies use     Benzodiazepines "I take ativan"     Caffeine      Inhalants      Others:                          Blackouts:  No DT's:  No Withdrawal Symptoms:  None  Social History: Current Place of Residence:  Personnel officer of Birth: [place]  Family Members: 2 sons, a daughter Marital Status:  Widowed    Relationships: Children     History of Abuse (Emotional/Phsycial/Sexual): None reported Occupational Experiences; retired  Family History:  Major depressive disorder - Mother  Mental Status Examination/Evaluation: Objective:  Appearance: Well Groomed  Patent attorney::  Good  Speech:  Clear and Coherent  Volume:  Normal  Mood:  "I feel useless right now"  Affect:  Flat  Thought Process:  Logical  Orientation:  Full  Thought Content:  Denies AVH    Suicidal Thoughts:  Yes.  without intent/plan  Homicidal Thoughts:  No  Judgement:  Impaired  Insight:  Fair  Psychomotor Activity:  Normal  Akathisia:  NO  Handed:  Right               Assessment:    AXIS I Major  Depression, Recurrent severe  AXIS II Deferred  AXIS III Past Medical History  Diagnosis Date  . Diabetes mellitus   . Depression   . Hypertension   . Acid reflux   . Diverticular disease   . Rheumatoid arthritis   . Osteoarthritis   . Stroke   . Bell's palsy      AXIS IV other psychosocial or environmental problems  AXIS V 41-50 serious symptoms    Treatment Plan Summary: Daily contact with patient to assess and evaluate symptoms and progress in treatment Medication management  Observation Level/Precautions:  Q 15 minutes checks for safety  Laboratory:  Reviewed and noted ED lab findings.  Psychotherapy: Group    Medications:  See lists.  Routine PRN Medications:  Yes    Discharge Concerns: Safety    Other:      Armandina Stammer I 12/11/201212:24 PM

## 2011-08-05 NOTE — Progress Notes (Signed)
[facility] Group Notes: (Counselor/Nursing/MHT/Case Management/Adjunct)   Type of Therapy:  Group Therapy  Participation Level:  Minimal  Participation Quality:  Attentive  Affect:  Blunted  Cognitive:  Appropriate  Insight:  None  Engagement in Group: Limited  Engagement in Therapy:  None  Modes of Intervention:  Support and Exploration  Summary of Progress/Problems: Tracey Mason  was attentive but not engaged in group process    Tracey Mason 08/05/2011  2:47 PM

## 2011-08-05 NOTE — Progress Notes (Signed)
  MHT put make up bag in patient's locker in admission room.  Items put in locker:    Makeup bag, conditioner, and glass container of makeup in locker #20.

## 2011-08-05 NOTE — Progress Notes (Signed)
Inpatient Diabetes Program Recommendations  AACE/ADA: New Consensus Statement on Inpatient Glycemic Control (2009)  Target Ranges:  Prepandial:   less than 140 mg/dL      Peak postprandial:   less than 180 mg/dL (1-2 hours)      Critically ill patients:  140 - 180 mg/dL   Reason for Visit: Hyperglycemia  Inpatient Diabetes Program Recommendations Correction (SSI): Add Novolog sensitive tidwc  Note: CBGs 177, 252

## 2011-08-05 NOTE — Progress Notes (Signed)
Vol admit.  Increasing depression for two years since her husband died from cancer.  That same year her infant grandson died from aspiration pneumonia.  At times she see her husband, mother or child and talks to them.  Pt lives alone.  She states she would not want to live with her children, because she has tried that before.  Pt is on medication for high cholesterol and HTN.  She has IDDM.  Her suicide plan was to not take her insulin.  Her UDS was negative and she denies substance abuse.  Pt was cooperative with admission process, but guarded with answers to questions.  Staff offered a sandwich which pt accepted.  CBG before the sandwich was 205.  Oriented to unit/room.

## 2011-08-05 NOTE — Progress Notes (Addendum)
Patient ID: Tracey Mason, female   DOB: Jun 27, 1946, 65 y.o.   MRN: [id number]   Patient denied SI and HI while talking to nurse this morning, contracted for safety.   Stated yesterday she had thoughts of killing herself.  Plan was to stop taking her insulin.  Denied A/V hallucinations.  Denied pain.   Patient went to group this morning and has been cooperative and pleasant. On self inventory sheet, patient needs medication to help with her sleep.  Has improving appetite, low energy level, improving attention span.  Rated depression and hopelessness #10.   Positive for SI, contracted for safety.  Has experienced lightheadedness and headaches in past 24 hours.  Rated pain goal #5, worst pain #10.  Denied pain while talking to nurse this morning.   Plans to "get more involved with my church, talk more to my friends and family about how I really feel.  How do I stop feeling like I don't want to live."  Unsure of discharge plans.  No problems taking meds after discharge.  Nurse informed pharmacist, N.P., and doctor that no sliding scale insulin is ordered for patient during day.   Patient agreed to eat nutritious foods, and drink plenty of water to lower blood sugar.

## 2011-08-05 NOTE — Tx Team (Signed)
Initial Interdisciplinary Treatment Plan  PATIENT STRENGTHS: (choose at least two) Ability for insight Average or above average intelligence Capable of independent living General fund of knowledge Supportive family/friends  PATIENT STRESSORS:Loss of husband and grandson    PROBLEM LIST: Problem List/Patient Goals Date to be addressed Date deferred Reason deferred Estimated date of resolution                                                         DISCHARGE CRITERIA:  Ability to meet basic life and health needs Improved stabilization in mood, thinking, and/or behavior Reduction of life-threatening or endangering symptoms to within safe limits Verbal commitment to aftercare and medication compliance  PRELIMINARY DISCHARGE PLAN: Attend aftercare/continuing care group Participate in family therapy Return to previous living arrangement  PATIENT/FAMIILY INVOLVEMENT: This treatment plan has been presented to and reviewed with the patient, Tracey Mason, and/or family member. The patient and family have been given the opportunity to ask questions and make suggestions.  Jesus Genera [facility] 08/05/2011, 12:30 AM

## 2011-08-05 NOTE — Tx Team (Signed)
Initial Interdisciplinary Treatment Plan  PATIENT STRENGTHS: (choose at least two) Ability for insight Average or above average intelligence Capable of independent living General fund of knowledge Supportive family/friends  PATIENT STRESSORS: Loss of husband and grandson two yrs ago*   PROBLEM LIST: Problem List/Patient Goals Date to be addressed Date deferred Reason deferred Estimated date of resolution  Depression      Risk for self harm      Grief/loss of husband and grandson in same year      Sometimes sees husband and talks to him      IDDM                               DISCHARGE CRITERIA:  Ability to meet basic life and health needs Improved stabilization in mood, thinking, and/or behavior Motivation to continue treatment in a less acute level of care Safe-care adequate arrangements made Verbal commitment to aftercare and medication compliance  PRELIMINARY DISCHARGE PLAN: Attend aftercare/continuing care group Participate in family therapy Return to previous living arrangement  PATIENT/FAMIILY INVOLVEMENT: This treatment plan has been presented to and reviewed with the patient, Tracey Mason, and/or family member. The patient and family have been given the opportunity to ask questions and make suggestions.  Jesus Genera [facility] 08/05/2011, 12:57 AM

## 2011-08-05 NOTE — Progress Notes (Signed)
Interdisciplinary Treatment Plan Update (Adult)  Date:  08/05/2011  Time Reviewed:  11:12 AM   Progress in Treatment: Attending groups:   Yes   Participating in groups:  Yes Taking medication as prescribed:  Yes Tolerating medication:  Yes Family/Significant othe contact made:  Patient understands diagnosis:  Yes Discussing patient identified problems/goals with staff: Yes Medical problems stabilized or resolved: Yes Denies suicidal/homicidal ideation:Yes Issues/concerns per patient self-inventory:  Other:  New problem(s) identified:  Reason for Continuation of Hospitalization: Anxiety Depression Medication stabilization Suicidal ideation  Interventions implemented related to continuation of hospitalization:  Medication Management; safety checks q 15 mins  Additional comments:  Estimated length of stay: 3-5 days  Discharge Plan:  To be explored with patient  New goal(s):  Review of initial/current patient goals per problem list:   1.  Goal(s):  Reduce SI  Met:  No  Target date:  D/c As evidenced by:  Will no longer endorse SI 2.  Goal (s):  Reduce depression/anxiety  Met:  No  Target date: d/c  As evidenced by: Currently rates depression and anxiety at ten  3.  Goal(s): Stabilize on medications  Met:  no  Target date:  D/c  As evidenced by:  Will report medication working - symptoms decreasesd  4.  Goal(s):  Met:  Yes  Target date:  As evidenced by:  Attendees: Patient:     Family:     Physician:  Franchot Gallo, MD 08/05/2011 11:12 AM   Nursing:    08/05/2011 11:12 AM   CaseManager:  Juline Patch, LCSW 08/05/2011 11:12 AM   Counselor:  Angus Palms, LCSW 08/05/2011 11:12 AM   Other:  Landry Corporal, NP 08/05/2011 11:12 AM   Other:     Other:     Other:      Scribe for Treatment Team:   Wynn Banker, LCSW,  08/05/2011 11:12 AM
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
## 2011-08-05 NOTE — Progress Notes (Signed)
Patient seen during discharge planning group.  She endorses SI but contracts for safety.  Patient rates depression, anxiety, hopelessness and helplessness all at ten.  She endorses drinking a bottle of wine daily for the past six months and stated she would drink more if she could afford it.  Patient has home, transportation and assess to medications. She will need referral for outpatient follow up.  Patient reports death of young grandson, husband and dog a precipitants.

## 2011-08-06 LAB — GLUCOSE, CAPILLARY

## 2011-08-06 MED ORDER — INSULIN ASPART 100 UNIT/ML ~~LOC~~ SOLN
0.0000 [IU] | Freq: Three times a day (TID) | SUBCUTANEOUS | Status: DC
  Administered 2011-08-06: 8 [IU] via SUBCUTANEOUS
  Administered 2011-08-07: 5 [IU] via SUBCUTANEOUS
  Administered 2011-08-07: 3 [IU] via SUBCUTANEOUS
  Administered 2011-08-07 – 2011-08-08 (×2): 5 [IU] via SUBCUTANEOUS
  Administered 2011-08-08 (×2): 8 [IU] via SUBCUTANEOUS

## 2011-08-06 NOTE — Progress Notes (Signed)
Patient seen during discharge planning group.  She endorses off/on SI and denies HI.  She advised of feeling better today and rated symptoms at five.  Patient agreeable to follow up with CD-IOP.

## 2011-08-06 NOTE — Progress Notes (Signed)
[facility] Group Notes: (Counselor/Nursing/MHT/Case Management/Adjunct)   Type of Therapy:  Group Therapy  Participation Level:  Active  Participation Quality:  Attentive, Appropriate, Sharing  Affect:  Blunted  Cognitive:  Appropriate  Insight:  Limited  Engagement in Group: Good  Engagement in Therapy:  Good  Modes of Intervention:  Support and Exploration  Summary of Progress/Problems:  Laportia came to group late, but was very engaged while present. She processed her experience of grief and fear of change. Jules talked about her resistance to the changes that have taken place in her life since husband's death, stating that the 37 years married to him were the only good ones in her life, and she does not want to be without him. She shared that her life was terrible before him and that she only ever felt safe and comfortable with him, so now that he is gone, the remainder of her life cannot possibly be good. Immediately after making this statement, Morghan went back and said that her children are an exception to this, as she loves them unconditionally even when they put her through a lot. Sheela explored how she went into survival mode immediately after husband's death and has never had the time to grieve. She processed the moments before her husband died and how she was there for him in various ways, stating that she didn't want to let go and tell him he could let go, and that she still feels that way.  Billie Lade 08/06/2011  3:17 PM

## 2011-08-06 NOTE — Progress Notes (Signed)
Patient ID: Tracey Mason, female   DOB: Feb 04, 1946, 65 y.o.   MRN: [id number] Pt seen in consult room.  Pt reports that she slept well with the sleeping medication last night.  She reports her mood as 5, anxiety 5, no suicidal ideation and hopelessness adn helplessness at 5 all on the same scale of 1 the least and 10 the most.  She feels that the Cymbalta is helping a little in that she feels more mellow and less desiring to chop off other people's heads.  She did confess that she forgot to work on her assignment that I gave her yesterday.  She is to work on forgiving herself.

## 2011-08-06 NOTE — Progress Notes (Signed)
Patient ID: Tracey Mason, female   DOB: 09-Dec-1945, 65 y.o.   MRN: [id number] Pt is awake and active on the unit this AM. Pt denies SI/HI and A/V hallucinations. Pt is participating in the milieu and is cooperative with staff. Pt states that she has become increasingly depressed and isolative since her husband passed away. When asked about goals pt stated that she needs to get out of her house more often. Pt states that she plans to get involved more in her church and volunteer in the community. Writer will continue to monitor.

## 2011-08-06 NOTE — Progress Notes (Signed)
[facility] Group Notes: (Counselor/Nursing/MHT/Case Management/Adjunct)   Type of Therapy:  Group Therapy  Participation Level:  Active  Participation Quality:  Active, Attentive, and Sharing  Affect:  Blunted  Cognitive:  Appropriate  Insight:  Limited  Engagement in Group: Good   Engagement in Therapy:  Good  Modes of Intervention:  Support and Exploration  Summary of Progress/Problems: Tracey Mason engaged in discussion of troublesome emotions and how to use those to one's benefit rather than detriment. She shared about how she has disconnected herself in various ways from people in her life who were toxic to her, and how this has helped her to be healthier. She seemed to have trouble reframing loneliness as independence, but acknowledged that she needs to find a way to feel better about her new state of doing things on her own.  Tracey Mason reported that this exercise would be helpful in changing her outlook, but that she struggles with identifying and breaking her emotions down enough to do so on her own.   Tracey Mason 08/06/2011  3:49 PM

## 2011-08-06 NOTE — Progress Notes (Signed)
Recreation Therapy Group Note  Date: 08/06/11         Time: 1415      Group Topic/Focus: The focus of this group is on discussing various styles of communication and communicating assertively using 'I' (feeling) statements.  Participation Level: Active  Participation Quality: Attentive  Affect: Depressed  Cognitive: Oriented   Additional Comments: Patient reports that she often feels frustrated because she feels like her children don't listen to her.   Tracey Mason 08/06/2011 3:49 PM
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
## 2011-08-06 NOTE — Progress Notes (Deleted)
                                                                                                                                               Pt in the dayroom watching TV.  Minimal interaction with peers.  Pt attended group and participated.  Pt reports she has been very drowsy today and does not want any Trazodone tonight.  Informed pt it was a prn and she did not have to take it.  Pt still depressed, but denies SI.  She plans to spend more time with her family and get more involved at church.  She knows she needs to stop alienating those she loves.  Her sliding scale insulin was restarted today.  Please note MD sticky note on Pt summary that was addressed by pharmacy on admission and deferred to morning clarification.  Pt voices no needs/concerns at this time.  Safety maintained with q15 minute checks

## 2011-08-07 LAB — HEMOGLOBIN A1C
Hgb A1c MFr Bld: 6.9 % — ABNORMAL HIGH (ref <5.7)
Mean Plasma Glucose: 151 mg/dL — ABNORMAL HIGH (ref <117)

## 2011-08-07 LAB — GLUCOSE, CAPILLARY

## 2011-08-07 NOTE — Progress Notes (Signed)
Staff locked up in locker #33 some shorts with strings, sleep pants with strings, and fingernail clippers.

## 2011-08-07 NOTE — Progress Notes (Signed)
Interdisciplinary Treatment Plan Update (Adult)  Date:  08/07/2011  Time Reviewed:  11:00 AM   Progress in Treatment: Attending groups:   Yes   Participating in groups:  Yes Taking medication as prescribed:  Yes Tolerating medication:  Yes Family/Significant othe contact made:  Patient understands diagnosis:  Yes Discussing patient identified problems/goals with staff: Yes Medical problems stabilized or resolved: Yes Denies suicidal/homicidal ideation:Yes Issues/concerns per patient self-inventory:  Other:  New problem(s) identified:  Reason for Continuation of Hospitalization: Anxiety Depression Medication stabilization  Interventions implemented related to continuation of hospitalization:  Medication Management; safety checks q 15 mins  Additional comments:  Estimated length of stay: 1-2 days  Discharge Plan:  Home with follow  Up with [facility] CD-IOP  New goal(s):  Review of initial/current patient goals per problem list:   1.  Goal(s):  Eliminate SI  Met:  Yes  Target date: d/c  As evidenced by: Currently denies SI  2.  Goal (s):  Reduce depression/anxiety  Met:  Yes  Target date:  d/c  As evidenced by: Currently rates depression at four and anxiety at two - rated both at ten on admission  3.  Goal(s):  Stabilize on medications  Met:  Yes  Target date: d/c  As evidenced by:  Reports medications are working   4.  Goal(s):  Refer for outpatient follow  Met:  Yes  Target date:  d/c  As evidenced by: Malachi Bonds to follow with CD-IOP beginning 08/11/11   Attendees: Patient:     Family:     Physician:  Orson Aloe, MD 08/07/2011 11:00 AM   Nursing:   Cato Mulligan, RN 08/07/2011 11:00 AM   CaseManager:  Juline Patch, LCSW 08/07/2011 11:00 AM   Counselor:  Angus Palms, LCSW 08/07/2011 11:00 AM   Other:  Consuello Bossier, NP 08/07/2011 11:00 AM   Other:  Reyes Ivan, LCSWA 08/07/2011 11:09 AM   Other:  Quintella Reichert, RN 08/07/2011 11:10 AM      Other:      Scribe for Treatment Team:   Wynn Banker, LCSW,  08/07/2011 11:00 AM

## 2011-08-07 NOTE — Progress Notes (Addendum)
Patient ID: Tracey Mason, female   DOB: [birth year], 65 y.o.   MRN: [id number] Pt is awake and active on the unit this AM. Pt denies SI/HI and is cooperative with staff. Pt still desires to be more active and outgoing after discharge in order to improve her mood. Pt is also participating in the milieu today. Writer will continue to monitor. Writer also conducted education on Flexpen insulin administration. Pt states that she prefers this system to her previous insulin methods.
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
## 2011-08-07 NOTE — Progress Notes (Signed)
Pt in dayroom watching TV.  Minimal interaction with peers.  Pt attended group and participated.  Pt reports being very drowsy today and does not want any Trazodone at bedtime.  Reminded pt that Trazodone was prn and she did not need to take it unless she felt she needed it.  Pt still depressed, but denies SI.  She plans to spend more time with her family and be more involved in her church.  She realizes she needs to stop alienating those she loves.  Pt's sliding scale insulin was restarted.  Please note MD sticky note on Pt summary that was addressed by pharmacy on admission and deferred to morning clarification.   Pt voices no needs/concerns at this time.  Safety maintained with q15 minute checks.

## 2011-08-08 ENCOUNTER — Other Ambulatory Visit (HOSPITAL_COMMUNITY): Payer: No Typology Code available for payment source

## 2011-08-08 LAB — GLUCOSE, CAPILLARY
Glucose-Capillary: 256 mg/dL — ABNORMAL HIGH (ref 70–99)
Glucose-Capillary: 227 mg/dL — ABNORMAL HIGH (ref 70–99)
Glucose-Capillary: 279 mg/dL — ABNORMAL HIGH (ref 70–99)

## 2011-08-08 MED ORDER — DULOXETINE HCL 30 MG PO CPEP: 30.0000 mg | ORAL_CAPSULE | Freq: Every day | ORAL | Status: DC

## 2011-08-08 MED ORDER — TRAZODONE HCL 50 MG PO TABS: 50.0000 mg | ORAL_TABLET | Freq: Every evening | ORAL | Status: AC | PRN

## 2011-08-08 NOTE — Progress Notes (Signed)
[facility] Case Management Discharge Plan:  Will you be returning to the same living situation after discharge: Yes,  Tracey Mason is returning to her home. Would you like a referral for services when you are discharged:Yes,  Referral made to CD-IOP Do you have access to transportation at discharge:Yes,  Patient daughter to transport her home Do you have the ability to pay for your medications:Yes,  Tracey Mason reports being able to afford medications  Interagency Information:     Patient to Follow up at:  Follow-up Information    Follow up with CD-IOP [facility] on 08/11/2011. (You will start the CD-IOP program Monday, August 11, 2011 at 10:00 a.m.)    Contact information:   [address] [place], [place]  [address]  [phone]         Patient denies SI/HI:   Yes,  Tracey Mason reports she is no longer experiencing SI    Safety Planning and Suicide Prevention discussed:  Yes,  Suicide prevention education reviewed with Tracey Mason individually  Barrier to discharge identified:No.  Summary and Recommendations:  Tracey Mason reports being much improved and looking forward to discharge home later today.  She plans to attend CD-IOP beginning 08/11/11.  She advised her daughter and niece will be spending the night with her tonight.  Suicide prevention education reviewed.   Tracey Mason 08/08/2011, 10:37 AM

## 2011-08-08 NOTE — Progress Notes (Signed)
Patient ID: Tracey Mason, female   DOB: February 19, 1946, 65 y.o.   MRN: [id number] Pt is a 65 yr old female that is currently anticipating a possible discharge for Friday evening. Pt has been encouraged by her physician to attend two AA meetings prior to leaving. Pt has attended one and wishes to attend tomorrow's meeting. Pt reports concerns of the length of time of her discharge. Pt was informed that her current nurse will relay her discharge status and that the paperwork shouldn't take more than 30 minutes on average. Pt has been reassured that If I have her again, we will address this at the begining of the shift for a smooth transition for discharge. Pt is currently denying SI at this time. Pt safety remains with q73min checks.

## 2011-08-08 NOTE — Progress Notes (Signed)
[facility] Group Notes:  (Counselor/Nursing/MHT/Case Management/Adjunct)  08/08/2011 3:11 PM  Type of Therapy:  group therapy  Participation Level:  Active  Participation Quality:  Attentive  Affect:  Appropriate  Cognitive:  Appropriate  Insight:  None  Engagement in Group:  None  Engagement in Therapy:  Good  Modes of Intervention:  Education  Summary of Progress/Problems: Pt attended [facility] group.   Purcell Nails 08/08/2011, 3:11 PM

## 2011-08-08 NOTE — Progress Notes (Signed)
Suicide Risk Assessment  Discharge Assessment     Demographic factors:  Assessment Details Time of Assessment: Admission Information Obtained From: Patient Current Mental Status:  Current Mental Status: Suicidal ideation indicated by patient Risk Reduction Factors:  Risk Reduction Factors: Sense of responsibility to family;Positive social support  CLINICAL FACTORS:   Severe Anxiety and/or Agitation Depression:   Comorbid alcohol abuse/dependence Hopelessness Alcohol/Substance Abuse/Dependencies Previous Psychiatric Diagnoses and Treatments Medical Diagnoses and Treatments/Surgeries  COGNITIVE FEATURES THAT CONTRIBUTE TO RISK:  No Cognitive risk factors noted.   SUICIDE RISK:   Minimal: No identifiable suicidal ideation.  Patients presenting with no risk factors but with morbid ruminations; may be classified as minimal risk based on the severity of the depressive symptoms  Patient denies suicidal or homicidal ideation, hallucinations, illusions, or delusions. Patient engages with good eye contact, is able to focus adequately in a one to one setting, and has clear goal directed thoughts. Patient speaks with a natural conversational volume, rate, and tone. Anxiety was reported at 1 on a scale of 1 the least and 10 the most. Depression was reported also at 1 on the same scale. Patient is oriented times 4, recent and remote memory intact. Judgement: Improved from admission Insight: Has learned that she needs to quick drinking and smoking pot, that she  Can get help, Cymbalta works great, she can sleep without a sleeping pill, and she has to focus on herself. Plan:  Remain on . amLODipine  5 mg Oral Daily  . aspirin EC  81 mg Oral Daily  . calcium-vitamin D  1 tablet Oral Daily  . cholecalciferol  1,000 Units Oral Daily  . DULoxetine  30 mg Oral Daily  . insulin aspart  0-15 Units Subcutaneous TID WC  . insulin glargine  50 Units Subcutaneous QHS  . lisinopril  20 mg Oral Daily  .  meloxicam  15 mg Oral Daily  . pantoprazole  40 mg Oral Q1200  . simvastatin  40 mg Oral q1800  . vitamin C  1,000 mg Oral Daily  Because Norvasc and Prinivil are for high blood pressure, Aspirin is to prevent strokes and heart attacks, OSCAL and Vit D are to replace your low Vit D, Cymbalta is for mood and pain, Mobic is for arthritis, Protonix is for acid reflux, and Zocor is for high cholesterol. Pt voices understanding of the risks and benefits of medication. Follow-up is with [facility] on Monday.    Tracey Mason 08/08/2011, 2:14 PM

## 2011-08-08 NOTE — Progress Notes (Signed)
[facility] Group Notes:  (Counselor/Nursing/MHT/Case Management/Adjunct)  08/08/2011 2:23 PM  Type of Therapy:  group therapy  Participation Level:  Minimal  Participation Quality:  Appropriate and Attentive  Affect:  Depressed and Flat  Cognitive:  Oriented  Insight:  Limited  Engagement in Group:  Limited  Engagement in Therapy:  Limited  Modes of Intervention:  Problem-solving, Support and exploration  Summary of Progress/Problems: Pt was able to share that she feels pretty hopeless and scared about the changes she is facing and shares she has lost her grandson and husband in the last year. Pt states she has a difficult time with their passing and that she does not know how to live without her husband of 35 years as he tool care of everything- pt stated she dislikes coming home to an empty clean house. Pt realizes she "needs to get a grip" but struggles to figure out how to take steps forward.   Purcell Nails 08/08/2011, 2:23 PM

## 2011-08-08 NOTE — Progress Notes (Signed)
[facility] Adult Inpatient Family/Significant Other Suicide Prevention Education  Suicide Prevention Education:  Contact Attempts: Dian Queen [phone] has been identified by the patient as the family member/significant other with whom the patient will be residing, and identified as the person(s) who will aid the patient in the event of a mental health crisis.  With written consent from the patient, two attempts were made to provide suicide prevention education, prior to and/or following the patient's discharge.  We were unsuccessful in providing suicide prevention education.  A suicide education pamphlet was given to the patient to share with family/significant other.  Date and time of first attempt: 2:38 PM 08/08/2011 Vanetta Mulders, LPCA  Date and time of second attempt:  Purcell Nails 08/08/2011, 2:36 PM

## 2011-08-08 NOTE — Discharge Summary (Signed)
Discharge Note  Patient:  Tracey Mason is an 65 y.o., female DOB:  [birth year]  Date of Admission:  08/04/2011  Date of Discharge:  08/08/2011  Level of Care:  IOP  Discharge destination:  HOME  Is patient on multiple antipsychotic therapies at discharge:  NO  Patient phone:  [phone] (home) Patient address:   [address] [place] [address]  The patient received suicide prevention pamphlet:  YES Belongings returned:  Valuables  Dan Humphreys, Scarlettrose Costilow 08/08/2011,2:25 PM

## 2011-08-08 NOTE — Progress Notes (Addendum)
Patient ID: Tracey Mason, female   DOB: October 02, 1945, 65 y.o.   MRN: [id number] Pt is awake and active on the unit this AM. Pt denies SI/HI and is cooperative with staff. Pt states that she slept well and feels good. Pt has a desire to stay away from ETOH after discharge, and she is attending AA meetings on the [facility] hall. Pt will d/c after the meeting this evening. Writer will continue to monitor.

## 2011-08-08 NOTE — Progress Notes (Signed)
Patient ID: Tracey Mason, female   DOB: 05-03-1946, 65 y.o.   MRN: [id number] Pt has order for d/c and denies any SI. Instructions were reviewed with both daughter and patient. Received supply of sample medications. Pt is to start IOP this coming Monday morning. Appeared ready for d/c and motivated to get better.

## 2011-08-08 NOTE — Discharge Summary (Signed)
Patient ID: KENZLIE DISCH MRN: [id number] DOB/AGE: 01/12/1946 65 y.o.  Admit date: 08/04/2011 Discharge date: 08/08/2011  Reason for Admission:  Hospital Course:   Discharge Diagnoses:  Active Problems:  * No active hospital problems. *    Condition on  Discharge:  Discharge Orders    Future Orders Please Complete By Expires   Diet - low sodium heart healthy      Scheduling Instructions:   Diabetic diet.   Increase activity slowly      Discharge instructions      Comments:   Do not Drink alcohol Keep your medications in a safe place, and keep them secured and out of reach of children.  Follow up with your primary physician for diabetes management.   Nursing communication      Scheduling Instructions:   You may dispense the glargine pen to the patient, but do not dispense the Novolog pen.     Current Discharge Medication List    START taking these medications   Details  DULoxetine (CYMBALTA) 30 MG capsule Take 1 capsule (30 mg total) by mouth daily. For depression  Qty: 30 capsule, Refills: 0    traZODone (DESYREL) 50 MG tablet Take 1 tablet (50 mg total) by mouth at bedtime as needed for sleep. Qty: 15 tablet, Refills: 0      CONTINUE these medications which have NOT CHANGED   Details  amLODipine (NORVASC) 5 MG tablet Take 5 mg by mouth daily.      Ascorbic Acid (VITAMIN C) 1000 MG tablet Take 1,000 mg by mouth daily.      aspirin 81 MG tablet Take 81 mg by mouth daily.      calcium citrate-vitamin D 200-200 MG-UNIT TABS Take 1 tablet by mouth 2 (two) times daily.      cholecalciferol (VITAMIN D) 1000 UNITS tablet Take 1,000 Units by mouth daily.      insulin glargine (LANTUS) 100 UNIT/ML injection Inject 50 Units into the skin at bedtime.      insulin glulisine (APIDRA) 100 UNIT/ML injection Inject 10-30 Units into the skin 3 (three) times daily before meals. Sliding scale    lisinopril (PRINIVIL,ZESTRIL) 20 MG tablet Take 20 mg by mouth daily.        meloxicam (MOBIC) 15 MG tablet Take 15 mg by mouth daily.      pantoprazole (PROTONIX) 40 MG tablet Take 40 mg by mouth daily.      simvastatin (ZOCOR) 40 MG tablet Take 40 mg by mouth daily.         Follow-up Information    Follow up with CD-IOP [facility] on 08/11/2011. (You will start the CD-IOP program Monday, August 11, 2011 at 10:00 a.m.)    Contact information:   [address] [place], [place]  [address]  [phone]         Signed: Orson Aloe 08/08/2011, 2:26 PM

## 2011-08-11 ENCOUNTER — Other Ambulatory Visit (HOSPITAL_COMMUNITY): Payer: No Typology Code available for payment source | Admitting: Psychology

## 2011-08-11 ENCOUNTER — Encounter (HOSPITAL_COMMUNITY): Payer: Self-pay | Admitting: Psychology

## 2011-08-11 DIAGNOSIS — F1920 Other psychoactive substance dependence, uncomplicated: Secondary | ICD-10-CM | POA: Insufficient documentation

## 2011-08-11 NOTE — Progress Notes (Signed)
Patient Discharge Instructions:  Admission Note Faxed,  08/11/2011 Discharge Note Faxed,   08/11/2011 After Visit Summary Faxed,  08/11/2011 Faxed to the Next Level Care provider:  08/11/2011 Facesheet faxed 08/11/2011 D/C Summary faxed 08/11/2011  Faxed to [facility] IOP @ [phone]    Wandra Scot, 08/11/2011, 5:50 PM
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
## 2011-08-12 NOTE — Progress Notes (Signed)
    Daily Group Progress Note  Program: CD-IOP   Group Time: 1-2:30 pm  Participation Level: Active  Behavioral Response: Sharing  Type of Therapy: Psycho-education Group  Topic: The Disease Concept of Addiction: first half of group spent presenting the disease concept of addiction. The idea was emphasized that once one has become chemically dependent, it is imperative that they remain free of all mind-altering chemicals. One patient wondered if this meant she couldn't smoke pot any more? I explained what we see happen when one stops using their primary drug of addiction, but continue to use others substances. Typically, they become addicted to the other substance or it brings them back to their primary drug of addiction. Bu the time this session ended, each member understood the importance of total abstinence in recovering from chemical dependency.       Group Time: 2:45- 4pm  Participation Level: Active  Behavioral Response: Sharing  Type of Therapy: Process Group  Topic: Group Process: second half of group was spent in process. Members shared their current feelings and frustrations in early recovery. One member had relapsed over the weekend and admitted she had been questioning whether she was really an alcoholic. There were 2 new group members and they introduced themselves in this half of group. Both had experienced significant losses and attributed their grief and pain to an increase in their use of chemicals. The session went well with good feedback among the group.    Summary: The patient was new to the group today. She introduced herself as an alcoholic and shared openly throughout the session. Nelwyn talked about her husband's death from lung cancer and how he had been her soul mate. She admitted she had not wanted to live without him, but has realized now that her children and grandchildren need her and she wants to live. She admitted that her diabetes is a major issue in  her life and, of course, that she shouldn't be drinking at all. She also shared an upcoming weekend family gathering and that her 2 sons are addicts and may be drinking and smoking pot. Kandie reported she can't possibly stay sober if they are all drinking. The patient received good support and feedback from her new group members and she responded well to this first group session.   Family Program: Family present? No   Name of family member(s):   UDS collected: No Results:  AA/NA attended?: No  Sponsor?: No   Vincent Streater, LCAS

## 2011-08-13 ENCOUNTER — Other Ambulatory Visit (HOSPITAL_COMMUNITY): Payer: No Typology Code available for payment source | Admitting: Psychology

## 2011-08-13 DIAGNOSIS — F1920 Other psychoactive substance dependence, uncomplicated: Secondary | ICD-10-CM

## 2011-08-14 NOTE — Progress Notes (Signed)
    Daily Group Progress Note  Program: CD-IOP   Group Time: 1-2:30 pm  Participation Level: minimal  Behavioral Response: patient   Type of Therapy: Psycho-education Group  Topic: Building a Recovery Plan: first half of group included a presentation on basic recovery concepts and how to build a daily recovery plan. Group members participated in the discussion and identified the many different issues one must address in early recovery. There was good disclosure with emphasis on being open and honest and reaching out for help. The importance of meetings and getting to know other people in recovery was noted. Members shared about different meetings they find particularly helpful and encouraged the newer members, who are still hesitant, to go to meetings.      Group Time: 2:45- 4 pm  Participation Level: None  Behavioral Response: patient has a cold and did not feel like sharing but listened  Type of Therapy: Process Group  Topic: Group Process: Second half of group was spent in process. Members discussed current issues and struggles they are dealing with. There was good feedback with a number of members encouraging the importance of spirituality in their daily life.    Summary: The patient shared during the check-in that she had a cold and didn't feel very good, but had wanted to come. She shared her concerns about the coming weekend and her fears about her oldest son drinking during the family Christmas gathering. The group provided good support for her and encouraged her to make her boundaries very clear. The patient was quiet for the remainder of the session.    Family Program: Family present? No   Name of family member(s):   UDS collected: No Results:   AA/NA attended?: No  Sponsor?: No   Braylon Grenda, LCAS

## 2011-08-15 ENCOUNTER — Other Ambulatory Visit (HOSPITAL_COMMUNITY): Payer: No Typology Code available for payment source | Admitting: Psychology

## 2011-08-15 LAB — DRUG SCREEN, URINE
Barbiturate Quant, Ur: NEGATIVE
Creatinine,U: 71.9 mg/dL
Methadone: NEGATIVE

## 2011-08-18 ENCOUNTER — Other Ambulatory Visit (HOSPITAL_COMMUNITY): Payer: No Typology Code available for payment source

## 2011-08-20 ENCOUNTER — Other Ambulatory Visit (HOSPITAL_COMMUNITY): Payer: No Typology Code available for payment source

## 2011-08-20 NOTE — Progress Notes (Signed)
    Daily Group Progress Note  Program: CD-IOP   Group Time: 1-2:30 pm  Participation Level: Minimal  Behavioral Response: Sharing  Type of Therapy: Psycho-education Group  Topic: Role-Playing Refusal Skills: first part of group spent role-playing. Two group members were teamed up and one tried to persuade the other to join in alcohol or drug use. The member was encouraged to use all sorts of persuasion to coax the other to use. The only thing the other member could do was say "No".  The role-play lasted 3 minutes. Members shared their experiences at the conclusion of the practice. They admitted it was very tempting to join in on the chemical use and frustrating to only be able to say "No". The importance of practicing one's reactions in case they get into difficult awkward situations was emphasized. Group members agreed that practicing various responses based on the specific scenario would be helpful.     Group Time: 2:45-4 pm  Participation Level: Active  Behavioral Response: Appropriate  Type of Therapy: Process Group  Topic:  Group Process and Graduation: the second half of group was spent in process. Members shared about their current difficulties and frustrations in early recovery. As the session neared the end, a graduation ceremony was held to honor a group member who was completing the program this afternoon. There were very kind words shared by the group and the graduating member offered hope and encouragement to his fellow members.    Summary: The patient did not engage in the role-playing anniversary, but observed and admitted it seemed very hard to refuse their invitations. The patient reported she is concerned about the coming weekend and whether her two sons will be drinking or respect her and leave the alcohol at home this time. The group discussed what plans she might make should there be alcohol and she agreed she would probably have to leave. The patient is very  open and honest about her relationship with alcohol and responded well to the feedback offered.   Family Program: Family present? No   Name of family member(s):   UDS collected: No Results:   AA/NA attended?: YesTuesday, Wednesday and Thursday  Sponsor?: No   Kairav Russomanno, LCAS

## 2011-08-22 ENCOUNTER — Other Ambulatory Visit (HOSPITAL_COMMUNITY): Payer: No Typology Code available for payment source | Attending: Physician Assistant | Admitting: Psychology

## 2011-08-25 ENCOUNTER — Other Ambulatory Visit (HOSPITAL_COMMUNITY): Payer: No Typology Code available for payment source | Admitting: Psychology

## 2011-08-25 DIAGNOSIS — F1920 Other psychoactive substance dependence, uncomplicated: Secondary | ICD-10-CM
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
## 2011-08-26 LAB — DRUG SCREEN, URINE
Barbiturate Quant, Ur: NEGATIVE
Creatinine,U: 96.2 mg/dL
Methadone: NEGATIVE
Opiates: NEGATIVE
Propoxyphene: NEGATIVE

## 2011-08-27 ENCOUNTER — Other Ambulatory Visit (HOSPITAL_COMMUNITY): Payer: No Typology Code available for payment source | Attending: Physician Assistant | Admitting: Psychology

## 2011-08-27 DIAGNOSIS — E119 Type 2 diabetes mellitus without complications: Secondary | ICD-10-CM | POA: Insufficient documentation

## 2011-08-27 DIAGNOSIS — M069 Rheumatoid arthritis, unspecified: Secondary | ICD-10-CM | POA: Insufficient documentation

## 2011-08-27 DIAGNOSIS — Z794 Long term (current) use of insulin: Secondary | ICD-10-CM | POA: Insufficient documentation

## 2011-08-27 DIAGNOSIS — M1990 Unspecified osteoarthritis, unspecified site: Secondary | ICD-10-CM | POA: Insufficient documentation

## 2011-08-27 DIAGNOSIS — F332 Major depressive disorder, recurrent severe without psychotic features: Secondary | ICD-10-CM | POA: Insufficient documentation

## 2011-08-27 DIAGNOSIS — I10 Essential (primary) hypertension: Secondary | ICD-10-CM | POA: Insufficient documentation

## 2011-08-27 DIAGNOSIS — F1020 Alcohol dependence, uncomplicated: Secondary | ICD-10-CM | POA: Insufficient documentation

## 2011-08-27 DIAGNOSIS — Z79899 Other long term (current) drug therapy: Secondary | ICD-10-CM | POA: Insufficient documentation

## 2011-08-27 DIAGNOSIS — Z7982 Long term (current) use of aspirin: Secondary | ICD-10-CM | POA: Insufficient documentation

## 2011-08-27 DIAGNOSIS — K219 Gastro-esophageal reflux disease without esophagitis: Secondary | ICD-10-CM | POA: Insufficient documentation

## 2011-08-27 DIAGNOSIS — F1920 Other psychoactive substance dependence, uncomplicated: Secondary | ICD-10-CM
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
## 2011-08-28 LAB — DRUG SCREEN, URINE
Barbiturate Quant, Ur: NEGATIVE
Benzodiazepines.: NEGATIVE
Creatinine,U: 177.9 mg/dL
Methadone: NEGATIVE
Propoxyphene: NEGATIVE

## 2011-08-28 NOTE — Progress Notes (Signed)
    Daily Group Progress Note  Program: CD-IOP   Group Time: 1-2:30 pm  Participation Level: Active  Behavioral Response: Appropriate  Type of Therapy: Psycho-education Group  Topic: Sources of Support. First half of group spent identifying the various sources of support one has in their/her life. A handout was provided asking that one identify the different sources of support they have. The categories included: family, friends. Job, school, 12-step groups, religion, housing, legal, health care and recreational activities. Members were instructed to list who they have in each category or any other category. Upon completion group members shared their lists on the board. There was a significant discrepancy between members. While some had long lists of people in their lives, others had only 2-3 people. The importance of securing support in different parts of one's life was emphasized and the group discussed, at length, how to secure more support.      Group Time: 2:45- 4 pm  Participation Level: Active  Behavioral Response: Appropriate and Sharing  Type of Therapy: Process Group  Topic: The second half of group was spent in process. Members shared about current issues and concerns in their lives. There was good feedback and discussion among the group.    Summary: The patient reported she had spent the [place] with her niece and they had had a nice time. She responded to another member's disclosure about having felt very lonely last night and how she missed her dead husband and the lifestyle they had lived. This patient could relate to these feelings and reported she and her husband had been inseparable and she was lost without him. The patient reported she had moved some of the pictures in her bedroom of her husband and is committed to finding a new church and making new friends. The group was very supportive of her seeking support and reaching out. She made some good comments and has  remained alcohol-free since she joined the program.    Family Program: Family present? No   Name of family member(s):   UDS collected: Yes Results: not available  AA/NA attended?: No  Sponsor?: No   Jahniya Duzan, LCAS

## 2011-08-29 ENCOUNTER — Other Ambulatory Visit (HOSPITAL_COMMUNITY): Payer: No Typology Code available for payment source | Admitting: Psychology

## 2011-09-01 ENCOUNTER — Other Ambulatory Visit (HOSPITAL_COMMUNITY): Payer: No Typology Code available for payment source | Admitting: Psychology

## 2011-09-01 NOTE — Progress Notes (Signed)
    Daily Group Progress Note  Program: CD-IOP   Group Time: 1-2:30 pm  Participation Level: Minimal  Behavioral Response: Appropriate  Type of Therapy: Psycho-education Group  Topic: Guest Speaker: the first half of group today was spent with a guest speaker. This woman had graduated from the CD-IOP program here at [facility] approximately 5 months ago and had obtained almost 8 months of sobriety. She shared about herself and struggles in addiction. She described how she has remained alcohol and drug-free and what she does on a daily basis to remain that way.      Group Time: 2:45-4pm  Participation Level: Active  Behavioral Response: Appropriate  Type of Therapy: Process Group  Topic: Group Process; the second half of group was spent in process. Members discussed current struggles and issues in early recovery. One member admitted she has not been doing what she needs to do to remain sober and stated that she wanted to be like the guest speaker and have almost 8 months of sobriety. Another shared some of her concerns in returning to college next week. There was good disclosure and support among group members.       Summary: The patient reported she is tired and bored and needs to make some changes. Another group member described what he does on a daily basis and encouraged her to volunteer and get out of the house. She admitted a neighbor is getting chemotherapy and perhaps she could drive her to the treatments. Another member encouraged her to go to church and the patient reported she might consider going to a new church. She accepted that if she doesn't make any changes, she may go back to drinking. The patient received good feedback from her fellow group members, but it remains to be seen whether she will put in the effort to make the changes she is talking about.    Family Program: Family present? No   Name of family member(s):   UDS collected: No Results:   AA/NA attended?:  No  Sponsor?: No   Manasseh Pittsley, LCAS

## 2011-09-02 NOTE — Progress Notes (Signed)
    Daily Group Progress Note  Program: CD-IOP   Group Time: 1-2:30 pm  Participation Level: Active  Behavioral Response: Appropriate  Type of Therapy: Psycho-education Group  Topic: "Making New Friends". A handout was provided asking group members to identify the type of friends they have, the difference between friends and acquaintances and other issues around friendship. They completed the questions and a discussion ensued about friends and what good friends really represent for one another. The emphasis of this exercise was to begin identifying ways to make new friends and how to meet them. The importance of the 12-step community was emphasized and members shared the relationships that have developed since they entered recovery. The exercise also brought out the difficulties and frustrations a number of members are having with their current friends and their refusal or lack of support for abstinence. There was good disclosure among the group and some members questioned why other members continued to spend time with people that were not supportive of their efforts.     Group Time: 2:45- 4 pm  Participation Level: Active  Behavioral Response: Appropriate  Type of Therapy: Process Group  Topic: Group Process: the second half of group was spent in process. Members shared about their current frustrations and issues in early recovery. There was a new group member today and he introduced himself to the group. There was good discussion among the group.    Summary: The patient was able to identify a number of friends, including 2 women who have known her for many years. Both of them had expressed concerns about her drinking and are very supportive of efforts to remain abstinent. In process, the patient admitted she had not attended church on Sunday and was just "not ready". She reported she had an appointment tomorrow to begin addressing some of her trauma and grief and she is hopeful about  it helping her. She expressed disappointment over her 2 adult children and their unwillingness to address their own chemical use. The patient's fellow group members reminded her that she couldn't change anyone but herself and encouraged her to focus on herself. The patient made some good comments and remains alcohol-free since December 10th.    Family Program: Family present? No   Name of family member(s):   UDS collected: No Results:   AA/NA attended?: No  Sponsor?: No   Axten Pascucci, LCAS

## 2011-09-02 NOTE — Discharge Summary (Signed)
Patient ID: Tracey Mason MRN: [id number] DOB/AGE: 67/08/1945 67 y.o.  Admit date: 08/04/2011 Discharge date: 08/08/2011  Reason for Admission: Patient reports, "I did not want to live any more. I did not see any sense in going on. I lost my newborn grandson in 2009.  Hospital Course:  Pt was admitted and started on Cymbalta for her depression and Trazodone for insomnia with good results noted.  She noted considerable benefit to these medications and little or no side effects.  She was agreeable to follow-up at the [facility].  Discharge Diagnoses:  AXIS I Major Depression, Recurrent severe  AXIS II Deferred  AXIS III Past Medical History  Diagnosis Date  . Diabetes mellitus   . Depression   . Hypertension   . Acid reflux   . Diverticular disease   . Rheumatoid arthritis   . Osteoarthritis   . Stroke   . Bell's palsy     AXIS IV other psychosocial or environmental problems  AXIS V 41-50 serious symptoms   Condition on  Discharge:  Plan: Discharge Orders    Future Orders Please Complete By Expires   Diet - low sodium heart healthy      Scheduling Instructions:   Diabetic diet.   Increase activity slowly      Discharge instructions      Comments:   Do not Drink alcohol Keep your medications in a safe place, and keep them secured and out of reach of children.  Follow up with your primary physician for diabetes management.   Nursing communication      Scheduling Instructions:   You may dispense the glargine pen to the patient, but do not dispense the Novolog pen.     Discharge Medication List as of 08/11/2011  3:27 PM    START taking these medications   Details  DULoxetine (CYMBALTA) 30 MG capsule Take 1 capsule (30 mg total) by mouth daily. For depression , Starting 08/08/2011, Until Sat 08/07/12, Print    traZODone (DESYREL) 50 MG tablet Take 1 tablet (50 mg total) by mouth at bedtime as needed for sleep., Starting 08/08/2011, Until Sun 09/07/11, Print        CONTINUE these medications which have NOT CHANGED   Details  amLODipine (NORVASC) 5 MG tablet Take 5 mg by mouth daily.  , Until Discontinued, Historical Med    Ascorbic Acid (VITAMIN C) 1000 MG tablet Take 1,000 mg by mouth daily.  , Until Discontinued, Historical Med    aspirin 81 MG tablet Take 81 mg by mouth daily.  , Until Discontinued, Historical Med    calcium citrate-vitamin D 200-200 MG-UNIT TABS Take 1 tablet by mouth 2 (two) times daily.  , Until Discontinued, Historical Med    cholecalciferol (VITAMIN D) 1000 UNITS tablet Take 1,000 Units by mouth daily.  , Until Discontinued, Historical Med    insulin glargine (LANTUS) 100 UNIT/ML injection Inject 50 Units into the skin at bedtime.  , Until Discontinued, Historical Med    insulin glulisine (APIDRA) 100 UNIT/ML injection Inject 10-30 Units into the skin 3 (three) times daily before meals. Sliding scale, Until Discontinued, Historical Med    lisinopril (PRINIVIL,ZESTRIL) 20 MG tablet Take 20 mg by mouth daily.  , Until Discontinued, Historical Med    meloxicam (MOBIC) 15 MG tablet Take 15 mg by mouth daily.  , Until Discontinued, Historical Med    pantoprazole (PROTONIX) 40 MG tablet Take 40 mg by mouth daily.  , Until Discontinued, Historical Med  simvastatin (ZOCOR) 40 MG tablet Take 40 mg by mouth daily.  , Until Discontinued, Historical Med       Follow-up Information    Follow up with [facility] on 08/11/2011. (You will start the CD-IOP program Monday, August 11, 2011 at 10:00 a.m.)    Contact information:   [address] [place], [place]  [address]  [phone]        Signed: Orson Aloe 09/02/2011, 5:51 PM

## 2011-09-03 ENCOUNTER — Other Ambulatory Visit (HOSPITAL_COMMUNITY): Payer: No Typology Code available for payment source | Admitting: Psychology

## 2011-09-03 DIAGNOSIS — F1920 Other psychoactive substance dependence, uncomplicated: Secondary | ICD-10-CM

## 2011-09-04 LAB — DRUG SCREEN, URINE
Benzodiazepines.: NEGATIVE
Creatinine,U: 30.24 mg/dL
Marijuana Metabolite: NEGATIVE
Methadone: NEGATIVE
Phencyclidine (PCP): NEGATIVE
Propoxyphene: NEGATIVE
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
## 2011-09-05 ENCOUNTER — Other Ambulatory Visit (HOSPITAL_COMMUNITY): Payer: No Typology Code available for payment source | Admitting: Psychology

## 2011-09-08 ENCOUNTER — Other Ambulatory Visit (HOSPITAL_COMMUNITY): Payer: No Typology Code available for payment source

## 2011-09-08 ENCOUNTER — Encounter (HOSPITAL_COMMUNITY): Payer: Self-pay | Admitting: Psychology
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
## 2011-09-09 NOTE — Progress Notes (Signed)
Patient ID: Tracey Mason, female   DOB: 19-Mar-1946, 66 y.o.   MRN: [id number] The patient was not in group today because she had a previously scheduled appointment with her endocrinologist. Cuca is an insulin-dependent diabetic and noted that it would be very difficult to reschedule her appointment with him as it had been made a long time ago. She is excused from group today.

## 2011-09-10 ENCOUNTER — Other Ambulatory Visit (HOSPITAL_COMMUNITY): Payer: No Typology Code available for payment source | Admitting: *Deleted

## 2011-09-11 NOTE — Progress Notes (Signed)
    Daily Group Progress Note  Program: CD-IOP  Group Time: 1:00- 2:00 pm  Participation Level: Active  Behavioral Response: Sharing, Disruptive, Monopolizing and Minimizing  Type of Therapy:  Education and Training Group  Summary of Progress: Group time was spent discussing reasons that the members stay abstinent from drugs and alcohol and exploring the though process of their relapses when they occur. Members completed a Decisional Balance sheet handout which listed immediate and future consequences for both staying off substances and using. They also completed a sheet about their own personal reasons to stay abstinent that they are to keep nearby to help in times of urges and craving to "think through the drink" or "play the tape to the end". Tracey Mason participated and shared with the group. She had to be redirected a few times and brought back to her own sobriety and not the problems of her family members. She does not seem to have any denial that she can use in moderation. She completed the group task and shared with the members.      Group Time: 2:00- 3:00 pm   Participation Level:  Active  Behavioral Response: Sharing, Monopolizing and Minimizing  Type of Therapy: Process Group  Summary of Progress: Group time was spent processing the first half of group, participating in a guided meditation and planning for the next 48 hours of sobriety. The patient participated and reported enjoying the meditation. She is not attending AA regularly and she still appears to have some resistance toward the idea. She has begun meeting with a trauma therapist and will be doing that weekly. Pt was more appropriate in her participation but still tended to discuss her daughter's problems and what she should do to fix them. Pt has limited insight.   Tracey Mason, COUNS

## 2011-09-12 ENCOUNTER — Other Ambulatory Visit (HOSPITAL_COMMUNITY): Payer: No Typology Code available for payment source | Admitting: Psychology

## 2011-09-15 ENCOUNTER — Other Ambulatory Visit (HOSPITAL_COMMUNITY): Payer: No Typology Code available for payment source | Admitting: Psychology
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
## 2011-09-16 NOTE — Progress Notes (Signed)
    Daily Group Progress Note  Program: CD-IOP   Group Time: 1-2:30 pm  Participation Level: Active  Behavioral Response: Appropriate  Type of Therapy: Psycho-education Group  Topic:Self-Esteem; learning how to improve your self-esteem: a presentation was provided with accompanying handout. The discussion dealt with how we develop our self-esteem and what we can do to improve it. The topic seemed fitting as all 5 group members admitted they have very low self-esteem. The importance of behaviors and actions being congruent with one's values was emphasized. Members were asked to identify values and subsequent behaviors that would reflect these values. There was good engagement during the session.      Group Time: 2:45-4 pm  Participation Level: Active  Behavioral Response: Sharing  Type of Therapy: Process Group  Topic:Group process: second half of group spent in process. Members shared the difficulties they are having in early recovery. One member admitted she had drank alcohol yesterday and reported, "I can't do it". The group shared their own experiences with 'slips' and the stress of a sobriety date was discussed. The group is struggling with their addictions and seems unable to relate the many problems or consequences of their alcohol and drug use to their problems in other aspects of life.     Summary: The patient spoke openly about her adult children and how disappointed she is in their behaviors. She admitted being lonely and struggling with cravings for alcohol. She reminded the group she has to pass by 4 [employer] stores on the way to group and she has to fight not pulling in and buying alcohol. I pointed out that she has not engaged in any of the behaviors she had been encouraged to do and, as a result, it is not surprising she is feeling badly. When I reminded her that she had promised to go to church yesterday, she reported she was not ready to go back to church yet. She appears  to be in that 'stuck' place where she acknowledges being unhappy, but not making any changes in her daily life. As the session neared the end, the patient admitted she needed to do different things if she was going to feel better about her life. She made some good comments and provided good feedback to fellow group members.    Family Program: Family present? No   Name of family member(s):   UDS collected: No Results:   AA/NA attended?: YesFriday  Sponsor?: No   Nysir Fergusson, LCAS

## 2011-09-17 ENCOUNTER — Other Ambulatory Visit (HOSPITAL_COMMUNITY): Payer: No Typology Code available for payment source | Admitting: Psychology

## 2011-09-18 NOTE — Progress Notes (Signed)
    Daily Group Progress Note  Program: CD-IOP   Group Time: 1-2:30 pm  Participation Level: Active  Behavioral Response: Appropriate  Type of Therapy: Psycho-education Group  Topic:Visit with the Pharmacist: first half of group was spent with the [facility] pharmacist, Peggye Fothergill. She discussed the effects of alcohol and drugs on the brain and body. Included in the discussion was the symptoms of impairment and withdrawal. There was a detailed explanation on the potential dangers that hallucinogens represent and the types of psychosis that are frequently displayed. The group was very attentive and asked questions about their medications and some of the mental health issues, particularly anxiety, that they struggle with. There was excellent feedback and disclosure among the group.       Group Time: 2:45-4 pm  Participation Level: Active  Behavioral Response: Appropriate and Sharing  Type of Therapy: Process Group  Topic: Group Process; Second half of group spent in process. A member had returned after an absence of 4 sessions and he disclosed the harrowing trip he had taken with a drug dealer down to [place]. Another member had returned after a period of time at [facility]. She discussed what will be different this time and the requirements of living in a halfway house. There was good discussion and sharing about current issues and concerns.   Summary: The patient engaged actively in the session with the pharmacist and talked about her alcohol use and diabetes. She asked about the Cymbalta she is currently prescribed and wondered if there was something else that might be more helpful. She provided good feedback to her fellow group members and agreed that she needed to get out and meet more people and stop sitting at home all day. The patient talked about her family and the many problems her adult children have. She was encouraged to attend a church service that recently started a 6 pm Sunday  service. The patient responded well to this intervention.    Family Program: Family present? No   Name of family member(s):   UDS collected: No Results:   AA/NA attended?: YesFriday  Sponsor?: No   Kathrin Folden, LCAS

## 2011-09-19 ENCOUNTER — Other Ambulatory Visit (HOSPITAL_COMMUNITY): Payer: No Typology Code available for payment source

## 2011-09-22 ENCOUNTER — Other Ambulatory Visit (HOSPITAL_COMMUNITY): Payer: No Typology Code available for payment source | Admitting: Psychology
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
## 2011-09-24 ENCOUNTER — Other Ambulatory Visit (HOSPITAL_COMMUNITY): Payer: No Typology Code available for payment source | Admitting: Psychology

## 2011-09-24 DIAGNOSIS — F1920 Other psychoactive substance dependence, uncomplicated: Secondary | ICD-10-CM

## 2011-09-25 LAB — PRESCRIPTION ABUSE MONITORING 17P, URINE
Amphetamine/Meth: NEGATIVE ng/mL
Barbiturate Screen, Urine: NEGATIVE ng/mL
Buprenorphine, Urine: NEGATIVE ng/mL
Creatinine, Urine: 78.31 mg/dL
Fentanyl, Ur: POSITIVE ng/mL — ABNORMAL HIGH
MDMA URINE: NEGATIVE ng/mL
Oxycodone Screen, Ur: NEGATIVE ng/mL
Propoxyphene: NEGATIVE ng/mL
Zolpidem, Urine: NEGATIVE ng/mL

## 2011-09-25 NOTE — Progress Notes (Signed)
    Daily Group Progress Note  Program: CD-IOP   Group Time: 1-2:30 pm  Participation Level: Active  Behavioral Response: Appropriate  Type of Therapy: Psycho-education Group  Topic: Fighting the Desire to Use; how does one do that? The first half of group was spent talking about early recovery and the struggle to remain abstinent. One member disclosed receiving a text from a friend inviting her to join him in using Ecstasy and MDMA. She cried as she shared about her loneliness in early recovery and how much she missed her friends. She went on to romanticize her experiences using hallucinogens. The group talked about their own cravings and what they have done in the past to refrain from relapsing. There was good disclosure and sharing among group members. Ultimately, the group agreed that one has to decide whether sobriety is important enough to fight for.      Group Time: 2:45-4 pm  Participation Level: Active  Behavioral Response: Sharing  Type of Therapy: Process Group  Topic: Group Process and Graduation; second half of group spent in process. Members shared about current struggles in early recovery. As the session neared the end, a ceremony was held for a departing group member. Kind words of support and encouragement were offered and the graduating member appeared genuinely touched by her fellow group members' affirmations.    Summary: the patient admitted she had wanted to drink this past weekend. She shared some events that occurred with her youngest son and although she controlled herself and 'kept my mouth shut', she wanted to drink afterwards. The patient agreed that finding the motivation to stay sober and not numb herself can be very challenging at times. She admitted that in her counseling she has realized that she has to find reasons to live beyond her children and grandchildren. She reported she continues to attend AA meetings and enjoys speaker meetings. She made some  good comments.   Family Program: Family present? No   Name of family member(s):   UDS collected: Yes Results:not available at this time  AA/NA attended?: [place]  Sponsor?: No   Kijana Estock, LCAS

## 2011-09-26 ENCOUNTER — Other Ambulatory Visit (HOSPITAL_COMMUNITY): Payer: No Typology Code available for payment source | Attending: Physician Assistant

## 2011-09-26 DIAGNOSIS — Z7982 Long term (current) use of aspirin: Secondary | ICD-10-CM | POA: Insufficient documentation

## 2011-09-26 DIAGNOSIS — I10 Essential (primary) hypertension: Secondary | ICD-10-CM | POA: Insufficient documentation

## 2011-09-26 DIAGNOSIS — F332 Major depressive disorder, recurrent severe without psychotic features: Secondary | ICD-10-CM | POA: Insufficient documentation

## 2011-09-26 DIAGNOSIS — E119 Type 2 diabetes mellitus without complications: Secondary | ICD-10-CM | POA: Insufficient documentation

## 2011-09-26 DIAGNOSIS — M1990 Unspecified osteoarthritis, unspecified site: Secondary | ICD-10-CM | POA: Insufficient documentation

## 2011-09-26 DIAGNOSIS — F1020 Alcohol dependence, uncomplicated: Secondary | ICD-10-CM | POA: Insufficient documentation

## 2011-09-26 DIAGNOSIS — M069 Rheumatoid arthritis, unspecified: Secondary | ICD-10-CM | POA: Insufficient documentation

## 2011-09-26 DIAGNOSIS — Z794 Long term (current) use of insulin: Secondary | ICD-10-CM | POA: Insufficient documentation

## 2011-09-26 DIAGNOSIS — Z79899 Other long term (current) drug therapy: Secondary | ICD-10-CM | POA: Insufficient documentation

## 2011-09-26 DIAGNOSIS — K219 Gastro-esophageal reflux disease without esophagitis: Secondary | ICD-10-CM | POA: Insufficient documentation

## 2011-09-26 LAB — FENTANYL (GC/LC/MS), URINE
Fentanyl, confirm: NEGATIVE NG/ML
Norfentanyl, confirm: NEGATIVE NG/ML

## 2011-09-29 ENCOUNTER — Other Ambulatory Visit (HOSPITAL_COMMUNITY): Payer: No Typology Code available for payment source | Admitting: Psychology

## 2011-09-29 DIAGNOSIS — F1920 Other psychoactive substance dependence, uncomplicated: Secondary | ICD-10-CM

## 2011-09-30 LAB — PRESCRIPTION ABUSE MONITORING 17P, URINE
Amphetamine/Meth: NEGATIVE ng/mL
Benzodiazepine Screen, Urine: NEGATIVE ng/mL
Buprenorphine, Urine: NEGATIVE ng/mL
Creatinine, Urine: 156.78 mg/dL
Methadone Screen, Urine: NEGATIVE ng/mL
Opiate Screen, Urine: NEGATIVE ng/mL
Propoxyphene: NEGATIVE ng/mL

## 2011-09-30 LAB — ALCOHOL METABOLITE (ETG), URINE: Ethyl Glucuronide (EtG): NEGATIVE ng/mL

## 2011-09-30 NOTE — Progress Notes (Signed)
    Daily Group Progress Note  Program: CD-IOP   Group Time: 1-2:30 pm  Participation Level: Active  Behavioral Response: Sharing  Type of Therapy: Process Group  Topic: Group Process; first half of group was spent in process. Members shared issues and concerns that were currently troubling them. There was good feedback and disclosure among the members.      Group Time: 2:45-4 pm  Participation Level: Active  Behavioral Response: Appropriate and Sharing  Type of Therapy: Psycho-education Group  Topic: The Progressive Disease of Addiction:  a presentation was provided identifying the progressive nature of addiction. A handout accompanied the presentation and group members participated in identifying their own experiences with the symptoms listed on the handout. All eight members were able to concur with the symptoms almost down to the bottom of the progression, which included "complete abandonment".  The insanity of continued efforts to hide use despite the obvious changes in personhood was acknowledged by almost everyone.      Summary: the patient reported her youngest child had phoned and asked if he, his wife, their child and large dog could come live with her for awhile? She admitted she had immediately answered "yes", but, in fact, she did not want them to move in with her. She reminded the group that she seems unable to say 'no' to her adult children. Another member suggested she put a time limit on their staying with her while others encouraged her to tell them the truth about what she was feeling. I noted that we have continued to encourage this patient to reach out and begin meeting people and seeking connection outside of her 3 adult children. The patient pointed out that she is beginning to do that. During the presentation on the progressive nature of addiction, the patient was able to identify with many of the symptoms of the deeper slide into addiction. She made some good  comments.    Family Program: Family present? No   Name of family member(s):   UDS collected: Yes Results:  AA/NA attended?: YesThursday, Saturday and Sunday  Sponsor?: No   Dwan Hemmelgarn, LCAS

## 2011-10-01 ENCOUNTER — Other Ambulatory Visit (HOSPITAL_COMMUNITY): Payer: No Typology Code available for payment source | Admitting: Psychology

## 2011-10-02 NOTE — Progress Notes (Signed)
    Daily Group Progress Note  Program: CD-IOP   Group Time: 1-2:30 pm  Participation Level: Active  Behavioral Response: Sharing  Type of Therapy: Psycho-education Group  Topic: The Wheel of Life: Session spent in evaluating the different aspects of one's life and how he/she is doing in each area. Group members were provided with a handout and map and they were asked to draw where they stood in each of the 8 identified categories. They then presented them by drawing a wheel on the board. Members shared about how they were doing in each category and were challenged to identify what they intended to do to improve those areas where they were lacking or not being satisfied. There was good disclosure among group members.     Group Time: 2:45-4 pm  Participation Level: Active  Behavioral Response: Appropriate, Sharing and Rationalizing  Type of Therapy: Process Group  Topic: Second half of session spent in process. It was so warm the group voted to sit out in the courtyard. The group pulled up 2 picnic tables and shared about current issues. The new member introduced himself and described his descent into alcoholism. Another shared that she had broken up with her boyfriend and although she felt it was right, she was, admittedly, struggling. There was good feedback and members felt 'heard'.   Summary: The patient shared during the check-in that she had agreed to allow her son, daughter-in-law, grandchild and large dog to move into her townhouse. She admitted she couldn't not tell them 'no'. I can't let them be out on the street".  Another member reminded her that she did not have to allow them in and that they wouldn't be on the street. There were many places they could go. I challenged the patient to stop assuming the 'victim' role and noted she was actively engaged in allowing or disallowing most everything that happens in her life. Another member encouraged her to set a time limit on how  long they would be allowed to stay. The patient also reported she had gotten a call back and was offered a job. 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 The group applauded this news and were pleased since this patient has been very reluctant to get out of her house and interact with folks since her husband died almost 2 years ago. The patient received some challenging feedback and responded well to this intervention.      Family Program: Family present? No   Name of family member(s):   UDS collected: No Results:   AA/NA attended?: YesTuesday, Friday and Sunday  Sponsor?: No   Aston Lieske, LCAS

## 2011-10-03 ENCOUNTER — Other Ambulatory Visit (HOSPITAL_COMMUNITY): Payer: No Typology Code available for payment source

## 2011-10-06 ENCOUNTER — Telehealth (HOSPITAL_COMMUNITY): Payer: Self-pay | Admitting: Psychology

## 2011-10-06 ENCOUNTER — Other Ambulatory Visit (HOSPITAL_COMMUNITY): Payer: No Typology Code available for payment source | Admitting: Psychology

## 2011-10-07 LAB — PRESCRIPTION ABUSE MONITORING 17P, URINE
Barbiturate Screen, Urine: NEGATIVE ng/mL
Benzodiazepine Screen, Urine: NEGATIVE ng/mL
Buprenorphine, Urine: NEGATIVE ng/mL
Carisoprodol, Urine: NEGATIVE ng/mL
Fentanyl, Ur: NEGATIVE ng/mL
Meperidine, Ur: NEGATIVE ng/mL
Propoxyphene: NEGATIVE ng/mL

## 2011-10-07 NOTE — Progress Notes (Signed)
    Daily Group Progress Note  Program: CD-IOP   Group Time: 1-2:30 pm  Participation Level: Active  Behavioral Response: Appropriate  Type of Therapy: Psycho-education Group  Topic: Chaplain: First half of group was spent with a chaplain. Donnelly Stager with [place] had come today to address the group and discuss spirituality. She provided a handout and talked about a "Soul Purpose Exercise", which included creating a collage. There was good disclosure among group members and one member shared that she had one of her many collages in her car. She brought the work down and a lively discussion ensued. The presentation by the chaplain proved very powerful and generated excellent plans for future exercises of this sort.     Group Time: 2:45-4 pm  Participation Level: Active  Behavioral Response: Sharing  Type of Therapy: Process Group  Topic: Group Process: second half of group spent in process. Members discussed issues or concerns that they were currently struggling with. The group had a PA from [facility] sitting in today and she introduced herself. This brought up a good discussion and details of her life, including her struggles with Type 1 Diabetes.    Summary: The patient reported that one of her art forms had been cooking. She used to cook all the time, but admitted since her husband died almost 2 years ago, she only cooks about 4 times a year. The chaplain encouraged her to consider volunteering to cook for some organization. She also explained how the collage work could help ease some of her grief over her husband's passing. In process, the patient reported she was really enjoying her new job at [employer]. 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 She lamented about her youngest son and plans to move elsewhere instead of to her home. The group reminded her she assumes too  much power in her relationships and isn't very honest in her communication with her family. The patient insisted she 'has to be this way', but the group quickly pointed out these are all choices she is making. The patient responded well to this intervention.    Family Program: Family present? No   Name of family member(s):   UDS collected: Yes Results: Not available  AA/NA attended?: YesTuesday and Thursday  Sponsor?: No   Jaquari Reckner, LCAS

## 2011-10-08 ENCOUNTER — Other Ambulatory Visit (HOSPITAL_COMMUNITY): Payer: No Typology Code available for payment source | Admitting: Psychology

## 2011-10-09 NOTE — Progress Notes (Signed)
    Daily Group Progress Note  Program: CD-IOP   Group Time: 1-2:30 pm  Participation Level: Minimal  Behavioral Response: Appropriate  Type of Therapy: Psycho-education Group  Topic: The Neurobiology of Addiction: First half of group was spent watching a DVD on the brain chemistry of addiction and the manner in which chemically dependent people's brains are different. The information was very compelling and the group was very attentive during the showing. Almost every group member could relate to some characteristic noted in the video and every member reported he/she had benefited from this piece.     Group Time: 2:45-4 pm  Participation Level: Active  Behavioral Response: Appropriate and Sharing  Type of Therapy: Process Group  Topic: Group Process; Second half of group was spent in process. Members shared about their current issues and struggles. One member returned after having missed 3 sessions and shared that her husband had asked her to leave their house. She cried as she talked about moving to her parent's home, but noted it was for the best and agreed she would be able to focus better on her recovery. Comments continued about the impact of the DVD and how members were able to look back and understand their history of drug use more clearly.   Summary: The patient arrived a little late for group today. She reported she was working today. When asked about her new job, she reported that she really 'loved it'. The patient reported she is getting out of the house more and feeling better about herself. When watching the DVD, the patient was able to identify with the speaker and noted that when she first had alcohol it did seem to fill a hole within her. She admitted that she has had cravings, but remains sober and has not had any alcohol since December 10th. She has made good progress in her recovery.    Family Program: Family present? No   Name of family member(s):   UDS  collected: No Results:   AA/NA attended?: YesTuesday, Thursday and Saturday  Sponsor?: No   Tracey Mason, LCAS

## 2011-10-10 ENCOUNTER — Encounter (HOSPITAL_COMMUNITY): Payer: Self-pay | Admitting: Psychology

## 2011-10-10 ENCOUNTER — Other Ambulatory Visit (HOSPITAL_COMMUNITY): Payer: No Typology Code available for payment source

## 2011-10-13 ENCOUNTER — Other Ambulatory Visit (HOSPITAL_COMMUNITY): Payer: Medicare Other | Attending: Physician Assistant

## 2011-10-13 ENCOUNTER — Encounter (HOSPITAL_COMMUNITY): Payer: Self-pay | Admitting: Psychology

## 2011-10-13 DIAGNOSIS — F1920 Other psychoactive substance dependence, uncomplicated: Secondary | ICD-10-CM | POA: Insufficient documentation

## 2011-10-13 NOTE — Progress Notes (Signed)
Patient ID: Tracey Mason, female   DOB: June 05, 1946, 66 y.o.   MRN: [id number] The patient phoned and reported she would not be in group today. She stated that she had to work until 5 pm today so she would not be able to come to group.

## 2011-10-14 NOTE — Progress Notes (Signed)
Patient ID: Tracey Mason, female   DOB: Mar 03, 1946, 66 y.o.   MRN: [id number] The patient arrived late for group today. She did not appear until almost 3 pm and was not eligible for billing purposes. The patient engaged in the discussion, but will not be credited with the session or billed for it.

## 2011-10-15 ENCOUNTER — Other Ambulatory Visit (HOSPITAL_COMMUNITY): Payer: Medicare Other | Admitting: Psychology

## 2011-10-16 NOTE — Progress Notes (Signed)
    Daily Group Progress Note  Program: CD-IOP   Group Time: 1-2:30 pm  Participation Level: Active  Behavioral Response: Appropriate  Type of Therapy: Psycho-education Group  Topic: Honesty: first half of group spent in discussion about the essential element of 'honesty' in recovery. A new member led the discussion as he shared openly about his 'stuff' and the importance of being totally honest and 'transparent' if he is to stay alive. Other members shared from their experiences. The session was very powerful and the willingness of group members to disclose many of their ugly 'truths' was impressive and motivated other group members to look at themselves with such honesty.     Group Time: 2:45- 4pm  Participation Level: Active  Behavioral Response: Sharing  Type of Therapy: Process Group  Topic: Second half of group was spent in process. A review of the group rules, including no romantic involvement between group members was emphasized. Two members had been discharged today because they had become involved and this altered relationship had made it impossible for one or both of them to share openly about their struggles and issues. In addition, I explained how the unwillingness or inability to be completely open can contaminate their entire group. The basic message was one of accountability and the consequences of the choices we make.    Summary: The patient reported things are going well. 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 She admitted she had been very critical of her adult children, but after working with her private counselor, she is getting better and not making so many comments or criticisms. The patient has remained sober since December 10th and reported she is committed to her recovery. She made some good comments and responded well to this intervention.    Family Program: Family present? No   Name of family member(s):   UDS collected: No  Results:   AA/NA attended?: YesWednesday, Saturday and Sunday  Sponsor?: No   Alechia Lezama, LCAS

## 2011-10-17 ENCOUNTER — Other Ambulatory Visit (HOSPITAL_COMMUNITY): Payer: Medicare Other

## 2011-10-20 ENCOUNTER — Other Ambulatory Visit (HOSPITAL_COMMUNITY): Payer: Medicare Other | Admitting: Psychology
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
## 2011-10-22 ENCOUNTER — Other Ambulatory Visit (HOSPITAL_COMMUNITY): Payer: Medicare Other

## 2011-10-24 ENCOUNTER — Other Ambulatory Visit (HOSPITAL_COMMUNITY): Payer: Medicare Other | Attending: Physician Assistant

## 2011-10-27 ENCOUNTER — Other Ambulatory Visit (HOSPITAL_COMMUNITY): Payer: Medicare Other

## 2011-10-29 ENCOUNTER — Other Ambulatory Visit (HOSPITAL_COMMUNITY): Payer: Medicare Other

## 2012-01-15 ENCOUNTER — Emergency Department (HOSPITAL_COMMUNITY): Payer: Medicare Other

## 2012-01-15 ENCOUNTER — Inpatient Hospital Stay (HOSPITAL_COMMUNITY)
Admission: EM | Admit: 2012-01-15 | Discharge: 2012-01-20 | DRG: 493 | Disposition: A | Discharge status: Skilled Nursing Facility | Payer: Medicare Other | Attending: Internal Medicine | Admitting: Internal Medicine

## 2012-01-15 ENCOUNTER — Encounter (HOSPITAL_COMMUNITY): Payer: Self-pay

## 2012-01-15 DIAGNOSIS — Z8673 Personal history of transient ischemic attack (TIA), and cerebral infarction without residual deficits: Secondary | ICD-10-CM

## 2012-01-15 DIAGNOSIS — Z87891 Personal history of nicotine dependence: Secondary | ICD-10-CM

## 2012-01-15 DIAGNOSIS — K219 Gastro-esophageal reflux disease without esophagitis: Secondary | ICD-10-CM

## 2012-01-15 DIAGNOSIS — S0990XA Unspecified injury of head, initial encounter: Secondary | ICD-10-CM

## 2012-01-15 DIAGNOSIS — M1990 Unspecified osteoarthritis, unspecified site: Secondary | ICD-10-CM

## 2012-01-15 DIAGNOSIS — S82209A Unspecified fracture of shaft of unspecified tibia, initial encounter for closed fracture: Secondary | ICD-10-CM | POA: Diagnosis present

## 2012-01-15 DIAGNOSIS — I10 Essential (primary) hypertension: Secondary | ICD-10-CM | POA: Diagnosis present

## 2012-01-15 DIAGNOSIS — E669 Obesity, unspecified: Secondary | ICD-10-CM | POA: Diagnosis present

## 2012-01-15 DIAGNOSIS — M069 Rheumatoid arthritis, unspecified: Secondary | ICD-10-CM

## 2012-01-15 DIAGNOSIS — E1165 Type 2 diabetes mellitus with hyperglycemia: Secondary | ICD-10-CM

## 2012-01-15 DIAGNOSIS — S8253XA Displaced fracture of medial malleolus of unspecified tibia, initial encounter for closed fracture: Principal | ICD-10-CM | POA: Diagnosis present

## 2012-01-15 DIAGNOSIS — F3289 Other specified depressive episodes: Secondary | ICD-10-CM | POA: Diagnosis present

## 2012-01-15 DIAGNOSIS — Y92009 Unspecified place in unspecified non-institutional (private) residence as the place of occurrence of the external cause: Secondary | ICD-10-CM

## 2012-01-15 DIAGNOSIS — K5790 Diverticulosis of intestine, part unspecified, without perforation or abscess without bleeding: Secondary | ICD-10-CM

## 2012-01-15 DIAGNOSIS — M810 Age-related osteoporosis without current pathological fracture: Secondary | ICD-10-CM | POA: Diagnosis present

## 2012-01-15 DIAGNOSIS — E7800 Pure hypercholesterolemia, unspecified: Secondary | ICD-10-CM | POA: Diagnosis present

## 2012-01-15 DIAGNOSIS — W108XXA Fall (on) (from) other stairs and steps, initial encounter: Secondary | ICD-10-CM | POA: Diagnosis present

## 2012-01-15 DIAGNOSIS — Z79899 Other long term (current) drug therapy: Secondary | ICD-10-CM

## 2012-01-15 DIAGNOSIS — E785 Hyperlipidemia, unspecified: Secondary | ICD-10-CM

## 2012-01-15 DIAGNOSIS — S82899A Other fracture of unspecified lower leg, initial encounter for closed fracture: Secondary | ICD-10-CM

## 2012-01-15 DIAGNOSIS — S82409A Unspecified fracture of shaft of unspecified fibula, initial encounter for closed fracture: Secondary | ICD-10-CM

## 2012-01-15 DIAGNOSIS — D62 Acute posthemorrhagic anemia: Secondary | ICD-10-CM | POA: Diagnosis present

## 2012-01-15 DIAGNOSIS — Z794 Long term (current) use of insulin: Secondary | ICD-10-CM

## 2012-01-15 DIAGNOSIS — F329 Major depressive disorder, single episode, unspecified: Secondary | ICD-10-CM

## 2012-01-15 DIAGNOSIS — W19XXXA Unspecified fall, initial encounter: Secondary | ICD-10-CM

## 2012-01-15 DIAGNOSIS — G510 Bell's palsy: Secondary | ICD-10-CM | POA: Diagnosis present

## 2012-01-15 DIAGNOSIS — E119 Type 2 diabetes mellitus without complications: Secondary | ICD-10-CM

## 2012-01-15 DIAGNOSIS — Z7982 Long term (current) use of aspirin: Secondary | ICD-10-CM

## 2012-01-15 DIAGNOSIS — D638 Anemia in other chronic diseases classified elsewhere: Secondary | ICD-10-CM | POA: Diagnosis present

## 2012-01-15 HISTORY — DX: Acute myocardial infarction, unspecified: I21.9

## 2012-01-15 HISTORY — PX: ORIF TIBIA FRACTURE: SHX5416

## 2012-01-15 HISTORY — DX: Pure hypercholesterolemia, unspecified: E78.00

## 2012-01-15 HISTORY — DX: Personal history of other diseases of the digestive system: Z87.19

## 2012-01-15 HISTORY — DX: Anemia, unspecified: D64.9

## 2012-01-15 LAB — CBC
HCT: 28.1 % — ABNORMAL LOW (ref 36.0–46.0)
Hemoglobin: 9.6 g/dL — ABNORMAL LOW (ref 12.0–15.0)
MCH: 31.3 pg (ref 26.0–34.0)
MCHC: 34.2 g/dL (ref 30.0–36.0)
MCV: 91.5 fL (ref 78.0–100.0)
Platelets: 252 K/uL (ref 150–400)
RBC: 3.07 MIL/uL — ABNORMAL LOW (ref 3.87–5.11)
RDW: 12.8 % (ref 11.5–15.5)
WBC: 10.9 K/uL — ABNORMAL HIGH (ref 4.0–10.5)

## 2012-01-15 LAB — URINE MICROSCOPIC-ADD ON

## 2012-01-15 LAB — BASIC METABOLIC PANEL
Calcium: 9.1 mg/dL (ref 8.4–10.5)
Creatinine, Ser: 1.04 mg/dL (ref 0.50–1.10)
GFR calc Af Amer: 63 mL/min — ABNORMAL LOW (ref >90)

## 2012-01-15 LAB — PROTIME-INR
INR: 1.03 (ref 0.00–1.49)
Prothrombin Time: 13.7 s (ref 11.6–15.2)

## 2012-01-15 LAB — GLUCOSE, CAPILLARY
Glucose-Capillary: 139 mg/dL — ABNORMAL HIGH (ref 70–99)
Glucose-Capillary: 127 mg/dL — ABNORMAL HIGH (ref 70–99)

## 2012-01-15 LAB — DIFFERENTIAL
Basophils Absolute: 0 10*3/uL (ref 0.0–0.1)
Basophils Relative: 0 % (ref 0–1)
Eosinophils Absolute: 0.1 K/uL (ref 0.0–0.7)
Eosinophils Relative: 1 % (ref 0–5)
Lymphocytes Relative: 19 % (ref 12–46)
Lymphs Abs: 2 K/uL (ref 0.7–4.0)
Monocytes Absolute: 0.8 10*3/uL (ref 0.1–1.0)
Monocytes Relative: 7 % (ref 3–12)
Neutro Abs: 8 10*3/uL — ABNORMAL HIGH (ref 1.7–7.7)
Neutrophils Relative %: 74 % (ref 43–77)

## 2012-01-15 LAB — URINALYSIS, ROUTINE W REFLEX MICROSCOPIC
Bilirubin Urine: NEGATIVE
Glucose, UA: NEGATIVE mg/dL
Hgb urine dipstick: NEGATIVE
Ketones, ur: NEGATIVE mg/dL
Nitrite: NEGATIVE
Protein, ur: NEGATIVE mg/dL
Specific Gravity, Urine: 1.011 (ref 1.005–1.030)
Urobilinogen, UA: 1 mg/dL (ref 0.0–1.0)
pH: 6 (ref 5.0–8.0)

## 2012-01-15 LAB — APTT: aPTT: 32 s (ref 24–37)

## 2012-01-15 LAB — BASIC METABOLIC PANEL WITH GFR
BUN: 15 mg/dL (ref 6–23)
CO2: 26 meq/L (ref 19–32)
Chloride: 101 meq/L (ref 96–112)
GFR calc non Af Amer: 54 mL/min — ABNORMAL LOW (ref >90)
Glucose, Bld: 154 mg/dL — ABNORMAL HIGH (ref 70–99)
Potassium: 4.5 meq/L (ref 3.5–5.1)
Sodium: 136 meq/L (ref 135–145)

## 2012-01-15 LAB — OCCULT BLOOD, POC DEVICE: Fecal Occult Bld: NEGATIVE

## 2012-01-15 MED ORDER — ACETAMINOPHEN 325 MG PO TABS: 650.0000 mg | ORAL_TABLET | Freq: Four times a day (QID) | ORAL | Status: DC | PRN

## 2012-01-15 MED ORDER — MORPHINE SULFATE 2 MG/ML IJ SOLN
2.0000 mg | Freq: Once | INTRAMUSCULAR | Status: AC
  Administered 2012-01-15 – 2012-01-16 (×2): 2 mg via INTRAVENOUS

## 2012-01-15 MED ORDER — LISINOPRIL 20 MG PO TABS
20.0000 mg | ORAL_TABLET | Freq: Every day | ORAL | Status: DC
  Administered 2012-01-16 – 2012-01-20 (×4): 20 mg via ORAL
  Filled 2012-01-15 (×5): qty 1

## 2012-01-15 MED ORDER — PANTOPRAZOLE SODIUM 40 MG PO TBEC
40.0000 mg | DELAYED_RELEASE_TABLET | Freq: Every day | ORAL | Status: DC
  Administered 2012-01-16 – 2012-01-20 (×4): 40 mg via ORAL
  Filled 2012-01-15 (×4): qty 1

## 2012-01-15 MED ORDER — INSULIN ASPART 100 UNIT/ML ~~LOC~~ SOLN
0.0000 [IU] | SUBCUTANEOUS | Status: DC
Start: 2012-01-16 — End: 2012-01-20
  Administered 2012-01-16: 5 [IU] via SUBCUTANEOUS
  Administered 2012-01-16: 3 [IU] via SUBCUTANEOUS
  Administered 2012-01-16: 2 [IU] via SUBCUTANEOUS
  Administered 2012-01-16 (×2): 3 [IU] via SUBCUTANEOUS
  Administered 2012-01-16: 2 [IU] via SUBCUTANEOUS
  Administered 2012-01-17: 5 [IU] via SUBCUTANEOUS
  Administered 2012-01-17 (×3): 3 [IU] via SUBCUTANEOUS
  Administered 2012-01-18: 2 [IU] via SUBCUTANEOUS
  Administered 2012-01-18: 5 [IU] via SUBCUTANEOUS
  Administered 2012-01-18: 2 [IU] via SUBCUTANEOUS
  Administered 2012-01-18 – 2012-01-19 (×2): 3 [IU] via SUBCUTANEOUS
  Administered 2012-01-19: 5 [IU] via SUBCUTANEOUS
  Administered 2012-01-20: 3 [IU] via SUBCUTANEOUS
  Administered 2012-01-20: 2 [IU] via SUBCUTANEOUS
  Filled 2012-01-15: qty 0.15

## 2012-01-15 MED ORDER — INSULIN ASPART 100 UNIT/ML ~~LOC~~ SOLN
0.0000 [IU] | Freq: Every day | SUBCUTANEOUS | Status: DC
  Administered 2012-01-19: 2 [IU] via SUBCUTANEOUS

## 2012-01-15 MED ORDER — ENOXAPARIN SODIUM 40 MG/0.4ML ~~LOC~~ SOLN
40.0000 mg | SUBCUTANEOUS | Status: DC
  Administered 2012-01-16: 40 mg via SUBCUTANEOUS
  Filled 2012-01-15: qty 0.4

## 2012-01-15 MED ORDER — AMOXICILLIN 500 MG PO CAPS
500.0000 mg | ORAL_CAPSULE | Freq: Three times a day (TID) | ORAL | Status: AC
  Administered 2012-01-16 – 2012-01-18 (×9): 500 mg via ORAL
  Filled 2012-01-15 (×10): qty 1

## 2012-01-15 MED ORDER — MORPHINE SULFATE 2 MG/ML IJ SOLN
INTRAMUSCULAR | Status: AC
  Administered 2012-01-15: 2 mg via INTRAVENOUS
  Filled 2012-01-15: qty 1

## 2012-01-15 MED ORDER — MORPHINE SULFATE 4 MG/ML IJ SOLN
4.0000 mg | INTRAMUSCULAR | Status: AC
Start: 2012-01-15 — End: 2012-01-15
  Administered 2012-01-15: 4 mg via INTRAVENOUS
  Filled 2012-01-15: qty 1

## 2012-01-15 MED ORDER — SODIUM CHLORIDE 0.9 % IV SOLN
Freq: Once | INTRAVENOUS | Status: AC
  Administered 2012-01-15: 50 mL/h via INTRAVENOUS

## 2012-01-15 MED ORDER — PSEUDOEPHEDRINE-GUAIFENESIN ER 60-600 MG PO TB12: 1.0000 | ORAL_TABLET | Freq: Two times a day (BID) | ORAL | Status: DC

## 2012-01-15 MED ORDER — ONDANSETRON HCL 4 MG/2ML IJ SOLN
4.0000 mg | Freq: Once | INTRAMUSCULAR | Status: AC
  Administered 2012-01-15: 4 mg via INTRAVENOUS
  Filled 2012-01-15: qty 2

## 2012-01-15 MED ORDER — MORPHINE SULFATE 2 MG/ML IJ SOLN
2.0000 mg | INTRAMUSCULAR | Status: DC | PRN
  Administered 2012-01-16 (×3): 2 mg via INTRAVENOUS
  Filled 2012-01-15 (×4): qty 1

## 2012-01-15 MED ORDER — AMLODIPINE BESYLATE 5 MG PO TABS
5.0000 mg | ORAL_TABLET | Freq: Every day | ORAL | Status: DC
  Administered 2012-01-16 – 2012-01-20 (×4): 5 mg via ORAL
  Filled 2012-01-15 (×5): qty 1

## 2012-01-15 MED ORDER — SODIUM CHLORIDE 0.9 % IV SOLN
Freq: Once | INTRAVENOUS | Status: AC
  Administered 2012-01-15: 150 mL/h via INTRAVENOUS

## 2012-01-15 MED ORDER — DULOXETINE HCL 30 MG PO CPEP
30.0000 mg | ORAL_CAPSULE | Freq: Every day | ORAL | Status: DC
  Administered 2012-01-16 – 2012-01-20 (×4): 30 mg via ORAL
  Filled 2012-01-15 (×5): qty 1

## 2012-01-15 MED ORDER — INSULIN GLARGINE 100 UNIT/ML ~~LOC~~ SOLN
25.0000 [IU] | Freq: Every day | SUBCUTANEOUS | Status: DC
  Administered 2012-01-16 – 2012-01-19 (×5): 25 [IU] via SUBCUTANEOUS

## 2012-01-15 MED ORDER — ONDANSETRON HCL 4 MG PO TABS: 4.0000 mg | ORAL_TABLET | Freq: Four times a day (QID) | ORAL | Status: DC | PRN

## 2012-01-15 MED ORDER — SODIUM CHLORIDE 0.9 % IV SOLN
INTRAVENOUS | Status: DC
  Administered 2012-01-16: 19:00:00 via INTRAVENOUS

## 2012-01-15 MED ORDER — ALUM & MAG HYDROXIDE-SIMETH 200-200-20 MG/5ML PO SUSP: 30.0000 mL | Freq: Four times a day (QID) | ORAL | Status: DC | PRN

## 2012-01-15 MED ORDER — ONDANSETRON HCL 4 MG/2ML IJ SOLN
INTRAMUSCULAR | Status: AC
  Filled 2012-01-15: qty 2

## 2012-01-15 MED ORDER — ACETAMINOPHEN 650 MG RE SUPP: 650.0000 mg | Freq: Four times a day (QID) | RECTAL | Status: DC | PRN

## 2012-01-15 MED ORDER — ONDANSETRON HCL 4 MG/2ML IJ SOLN: 4.0000 mg | Freq: Four times a day (QID) | INTRAMUSCULAR | Status: DC | PRN

## 2012-01-15 MED ORDER — HYDROCODONE-ACETAMINOPHEN 5-325 MG PO TABS: 1.0000 | ORAL_TABLET | ORAL | Status: DC | PRN

## 2012-01-15 NOTE — ED Notes (Signed)
Pt was brought in by ambulance with complaint of fall. Pt was going up her steps and loss her balance and fell. Pt fell at least 10 steps. Pt noted to have a deformed lt ankle. Pt was given a total of 350 mcg of fentanyl IV with 4 mg of Zofran. Pt is A/A/Ox4. Pt has positive pedal pulses.

## 2012-01-15 NOTE — ED Notes (Signed)
Dr. Malon Kindle, Orthopedic Surgery, at the bedside. Closed reduction of lt ankle done at the bedside with ortho tech present.

## 2012-01-15 NOTE — ED Notes (Signed)
CBG 127. Nurse notified

## 2012-01-15 NOTE — H&P (Signed)
PCP:   Minda Meo, MD, MD   Chief Complaint:  Fall  HPI: This is a pleasant 66 year old female who came home today, while walking up 14 flights of stairs to the second floor, she lost her balance and fell backwards. She did hit her head, which went through the sheet rack. She reports no loss of consciousness, no nausea no vomiting. She did have a headache which has since resolved. She is able to climb back up the 14 flights of stairs obtain her cell phone and call her family. Patient states it was a mechanical fall, she had no chest pains prior to the episode, no lightheadedness, no palpitations. Her family members called 911 and she was brought to the ER.  Patient has no history of cardiac disease. Should the left heart cath 5 years ago, which was normal. Should the stress test approximately 3 years ago which was normal. Patient works out on a regular basis at [employer]. She does not use a walker or cane. She does wear her ADLs. Patient lives alone.  Review of Systems: Positives bolded  anorexia, fever, weight loss,, vision loss, decreased hearing, hoarseness, chest pain, syncope, dyspnea on exertion, peripheral edema, balance deficits, hemoptysis, abdominal pain, melena, hematochezia, severe indigestion/heartburn, hematuria, incontinence, genital sores, muscle weakness, suspicious skin lesions, transient blindness, difficulty walking, depression, unusual weight change, abnormal bleeding, enlarged lymph nodes, angioedema, and breast masses.  Past Medical History: Past Medical History  Diagnosis Date  . Diabetes mellitus   . Depression   . Hypertension   . Acid reflux   . Diverticular disease   . Rheumatoid arthritis   . Osteoarthritis   . Stroke   . Bell's palsy   . Hypercholesterolemia    Past Surgical History  Procedure Date  . Appendectomy   . Tonsillectomy   . Cholecystectomy   . Abdominal hysterectomy   . Fracture surgery     Medications: Prior to Admission medications    Medication Sig Start Date End Date Taking? Authorizing Provider  amLODipine (NORVASC) 5 MG tablet Take 5 mg by mouth daily.     Yes Historical Provider, MD  amoxicillin (AMOXIL) 500 MG capsule Take 500 mg by mouth 3 (three) times daily. Begin 2 days before surgery   Yes Historical Provider, MD  Ascorbic Acid (VITAMIN C) 1000 MG tablet Take 1,000 mg by mouth daily.     Yes Historical Provider, MD  aspirin 81 MG tablet Take 81 mg by mouth daily.     Yes Historical Provider, MD  Calcium Citrate-Vitamin D (CITRACAL + D PO) Take 1 tablet by mouth 2 (two) times daily.   Yes Historical Provider, MD  cholecalciferol (VITAMIN D) 1000 UNITS tablet Take 1,000 Units by mouth daily.     Yes Historical Provider, MD  DULoxetine (CYMBALTA) 30 MG capsule Take 30 mg by mouth daily. For depression 08/08/11 08/07/12 Yes Viviann Spare, FNP  HYDROcodone-acetaminophen (NORCO) 5-325 MG per tablet Take 1-2 tablets by mouth every 4 (four) hours as needed. For pain   Yes Historical Provider, MD  insulin glargine (LANTUS) 100 UNIT/ML injection Inject 50 Units into the skin at bedtime.     Yes Historical Provider, MD  insulin glulisine (APIDRA) 100 UNIT/ML injection Inject 10-30 Units into the skin 3 (three) times daily before meals. Sliding scale   Yes Historical Provider, MD  lisinopril (PRINIVIL,ZESTRIL) 20 MG tablet Take 20 mg by mouth daily.     Yes Historical Provider, MD  meloxicam (MOBIC) 15 MG tablet Take 15  mg by mouth daily.     Yes Historical Provider, MD  pantoprazole (PROTONIX) 40 MG tablet Take 40 mg by mouth daily.     Yes Historical Provider, MD  pseudoephedrine-guaifenesin (MUCINEX D) 60-600 MG per tablet Take 1 tablet by mouth every 12 (twelve) hours.   Yes Historical Provider, MD  simvastatin (ZOCOR) 40 MG tablet Take 40 mg by mouth daily.     Yes Historical Provider, MD    Allergies:   Allergies  Allergen Reactions  . Prednisone Other (See Comments)    Causes stroke  . Cortisone Other (See  Comments)    Causes stroke    Social History:  reports that she has quit smoking. She does not have any smokeless tobacco history on file. She reports that she drinks about .5 ounces of alcohol per week. She reports that she does not use illicit drugs. No cane, lives alone, not to home oxygen  Family History: Family History  Problem Relation Age of Onset  . Diabetes type II    . Coronary artery disease      Physical Exam: Filed Vitals:   01/15/12 2030 01/15/12 2100 01/15/12 2130 01/15/12 2200  BP: 122/61 111/64 116/55 104/63  Pulse: 100 102 100 101  Temp:      TempSrc:      Resp: 19 16 14 21   SpO2: 92% 93% 93% 98%    General:  Alert and oriented times three, well developed and nourished, no acute distress Eyes: PERRLA, pink conjunctiva, no scleral icterus ENT: Moist oral mucosa, neck supple, no thyromegaly Lungs: clear to ascultation, no wheeze, no crackles, no use of accessory muscles Cardiovascular: regular rate and rhythm, no regurgitation, no gallops, no murmurs. No carotid bruits, no JVD Abdomen: soft, positive BS, non-tender, non-distended, no organomegaly, not an acute abdomen GU: not examined Neuro: CN II - XII patient with evidence of Bell's palsy left-sided, sensation intact Musculoskeletal: strength 5/5 all extremities left lower extremity strength not assessed, no clubbing, cyanosis or edema, right hand appears contracted the patient with good range of motion Skin: no rash, no subcutaneous crepitation, no decubitus Psych: appropriate patient   Labs on Admission:   [facility] 01/15/12 1852  NA 136  K 4.5  CL 101  CO2 26  GLUCOSE 154*  BUN 15  CREATININE 1.04  CALCIUM 9.1  MG --  PHOS --   No results found for this basename: AST:2,ALT:2,ALKPHOS:2,BILITOT:2,PROT:2,ALBUMIN:2 in the last 72 hours No results found for this basename: LIPASE:2,AMYLASE:2 in the last 72 hours  Basename 01/15/12 1852  WBC 10.9*  NEUTROABS 8.0*  HGB 9.6*  HCT 28.1*  MCV 91.5   PLT 252   No results found for this basename: CKTOTAL:3,CKMB:3,CKMBINDEX:3,TROPONINI:3 in the last 72 hours No components found with this basename: POCBNP:3 No results found for this basename: DDIMER:2 in the last 72 hours No results found for this basename: HGBA1C:2 in the last 72 hours No results found for this basename: CHOL:2,HDL:2,LDLCALC:2,TRIG:2,CHOLHDL:2,LDLDIRECT:2 in the last 72 hours No results found for this basename: TSH,T4TOTAL,FREET3,T3FREE,THYROIDAB in the last 72 hours No results found for this basename: VITAMINB12:2,FOLATE:2,FERRITIN:2,TIBC:2,IRON:2,RETICCTPCT:2 in the last 72 hours  Micro Results: No results found for this or any previous visit (from the past 240 hour(s)). Results for [facility], ILLINGWORTH (MRN [id number]) as of 01/15/2012 22:28  Ref. Range 01/15/2012 19:26  Color, Urine Latest Range: YELLOW  YELLOW  APPearance Latest Range: CLEAR  CLEAR  Specific Gravity, Urine Latest Range: 1.005-1.030  1.011  pH Latest Range: 5.0-8.0  6.0  Glucose, UA Latest Range: NEGATIVE mg/dL NEGATIVE  Bilirubin Urine Latest Range: NEGATIVE  NEGATIVE  Ketones, ur Latest Range: NEGATIVE mg/dL NEGATIVE  Protein Latest Range: NEGATIVE mg/dL NEGATIVE  Urobilinogen, UA Latest Range: 0.0-1.0 mg/dL 1.0  Nitrite Latest Range: NEGATIVE  NEGATIVE  Leukocytes, UA Latest Range: NEGATIVE  SMALL (A)  Hgb urine dipstick Latest Range: NEGATIVE  NEGATIVE  WBC, UA Latest Range: <3 WBC/hpf 0-2  Squamous Epithelial / LPF Latest Range: RARE  MANY (A)  Bacteria, UA Latest Range: RARE  RARE  Is a history of dye and  Radiological Exams on Admission:).Fossa Dg Tibia/fibula Left  01/15/2012  *RADIOLOGY REPORT*  Clinical Data: Fall with left ankle pain.  LEFT TIBIA AND FIBULA - 2 VIEW  Comparison: None.  Findings: There is a comminuted fracture of the distal fibular shaft, with apex medial angulation of the fracture fragments. Displaced medial malleolar fracture is seen as well.  There is disruption of  the ankle mortise, with dorsal displacement of the talus and foot.  IMPRESSION: Ankle fracture dislocation involving the medial malleolus and distal fibular shaft.  Original Report Authenticated By: Reyes Ivan, M.D.   Ct Head Wo Contrast  01/15/2012  *RADIOLOGY REPORT*  Clinical Data:  Fall.  CT HEAD WITHOUT CONTRAST CT CERVICAL SPINE WITHOUT CONTRAST  Technique:  Multidetector CT imaging of the head and cervical spine was performed following the standard protocol without intravenous contrast.  Multiplanar CT image reconstructions of the cervical spine were also generated.  Comparison:  None.  CT HEAD  Findings: No acute intracranial abnormality.  Specifically, no hemorrhage, hydrocephalus, mass lesion, acute infarction, or significant intracranial injury.  No acute calvarial abnormality. Low density areas in the basal ganglia bilaterally, likely old lacunar infarcts.  Air-fluid level in the right maxillary sinus and adjacent ethmoid air cells.  Mastoids are clear.  IMPRESSION: No acute intracranial abnormality.  Acute right paranasal sinusitis.  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  Prevertebral soft tissues are normal.  There is mild levoscoliosis.  No fracture visualized.  No epidural or paraspinal hematoma.  IMPRESSION: Severe degenerative changes.  No acute findings.  Original Report Authenticated By: Cyndie Chime, M.D.   Ct Cervical Spine Wo Contrast  01/15/2012  *RADIOLOGY REPORT*  Clinical Data:  Fall.  CT HEAD WITHOUT CONTRAST CT CERVICAL SPINE WITHOUT CONTRAST  Technique:  Multidetector CT imaging of the head and cervical spine was performed following the standard protocol without intravenous contrast.  Multiplanar CT image reconstructions of the cervical spine were also generated.  Comparison:  None.  CT HEAD  Findings: No acute intracranial abnormality.  Specifically, no hemorrhage, hydrocephalus, mass lesion,  acute infarction, or significant intracranial injury.  No acute calvarial abnormality. Low density areas in the basal ganglia bilaterally, likely old lacunar infarcts.  Air-fluid level in the right maxillary sinus and adjacent ethmoid air cells.  Mastoids are clear.  IMPRESSION: No acute intracranial abnormality.  Acute right paranasal sinusitis.  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  Prevertebral soft tissues are normal.  There is mild levoscoliosis.  No fracture visualized.  No epidural or paraspinal hematoma.  IMPRESSION: Severe degenerative changes.  No acute findings.  Original Report Authenticated By: Cyndie Chime, M.D.   Ct Ankle Left Wo Contrast  01/15/2012  *RADIOLOGY REPORT*  Clinical Data: Preoperative evaluation in patient with left ankle fracture dislocation.  CT OF THE LEFT ANKLE  WITHOUT CONTRAST  Technique:  Multidetector CT imaging was performed according to the standard protocol. Multiplanar CT image reconstructions were also generated.  Comparison: Left ankle x-rays obtained earlier same date.  Findings: Comminuted, multipart fracture involving the distal fibula, approximately 6-7 cm above the tip of the lateral malleolus.  Comminuted transverse fracture of the medial malleolus, with slight medial displacement of the distal fragment.  Obliquely oriented fracture involving the lateral aspect of the distal tibia. Vertically oriented comminuted fracture involving the posterior malleolus with slight displacement.  Ankle mortise intact with narrowing of the joint space.  No fractures involving the talus or calcaneus.  Tiny plantar calcaneal spur.  Small spur/and spleen. Spur/enthesopathy at the insertion of the Achilles tendon on the posterior calcaneus.  Focal enlargement and associated calcification involving what I believe is the tibialis posterior tendon in the hindfoot.  Large ankle joint effusion/hemarthrosis.   IMPRESSION:  1.  Comminuted multipart fracture involving the distal fibula approximately 6-7 cm above the tip of the lateral malleolus. 2.  Fractures involving the medial and posterior malleoli of the distal tibia. 3.  Fracture involving the lateral aspect of the distal tibia. 4.  Possible chronic tendonitis/bursitis involving what I believe is the tibialis posterior tendon (focal enlargement and associated calcification). 5.  Intact ankle mortise with narrowed joint space.  Original Report Authenticated By: Arnell Sieving, M.D.   Dg Foot 2 Views Left  01/15/2012  *RADIOLOGY REPORT*  Clinical Data: Fall with ankle pain and swelling.  LEFT FOOT - 2 VIEW  Comparison: None.  Findings: There is disruption of the ankle mortise, with dorsal displacement of the talus and foot.  Medial malleolar fracture is displaced.  A comminuted distal fibular shaft fracture is partially imaged.  Osseous structures may be mildly osteopenic.  There is a fracture involving the neck of the fifth proximal phalanx. Calcaneal spurs.  IMPRESSION:  1.  Ankle fracture dislocation with fractures involving the medial malleolus and distal fibular shaft. 2.  Fifth proximal phalangeal fracture.  Original Report Authenticated By: Reyes Ivan, M.D.    Assessment/Plan Present on Admission:  .Tibia/fibula fracture Admit to MedSurg Orthopedics is seen patient, will continue to follow. Pain control Patient moderate risk for surgical intervention Recent oral and sinus surgery approximately one week ago Continue antibiotics and Mucinex. Per family patient is not allowed to blow her nose. Also continue Claritin. Patient on a mechanical soft diet at home The patient tolerated the anesthesia well Diabetes mellitus Patient n.p.o. at  midnight. Lantus continued however dosages in halfed while n.p.o. Fingerstick sliding scale insulin every 4 hours for now, if remains stable increased to every 6  hours Hypertension GERD Dyslipidemia Diverticulosis Rheumatoid arthritis Osteoporosis History of CVA History of Bell's palsy All stable or pertinent medications and resumed  Full code DVT prophylaxis Team 3/Dr. Derry Lory, Tajia Szeliga 01/15/2012, 10:27 PM

## 2012-01-15 NOTE — Progress Notes (Signed)
Orthopedic Tech Progress Note Patient Details:  Tracey Mason 1946/05/16 [id number]  Type of Splint: Short Leg;Stirrup Splint Location: (L) LE Splint Interventions: Application    Jennye Moccasin 01/15/2012, 5:53 PM

## 2012-01-15 NOTE — ED Notes (Signed)
CBG 139. 

## 2012-01-15 NOTE — ED Notes (Signed)
Admitting MD at bedside.

## 2012-01-15 NOTE — ED Provider Notes (Signed)
History     CSN: [id number]  Arrival date & time 01/15/12  1531   First MD Initiated Contact with Patient 01/15/12 1534      Chief Complaint  Patient presents with  . Fall  . Ankle Injury    (Consider location/radiation/quality/duration/timing/severity/associated sxs/prior treatment) Patient is a 66 y.o. female presenting with fall. The history is provided by the patient.  Fall The accident occurred less than 1 hour ago. The fall occurred while walking. Distance fallen: 10 stairs. She landed on a hard floor. There was no blood loss. The point of impact was the head (left ankle). The pain is present in the head (left ankle). The pain is moderate. She was not ambulatory at the scene. There was no entrapment after the fall. There was no alcohol use involved in the accident. Associated symptoms include headaches (mild). Pertinent negatives include no fever, no abdominal pain, no nausea, no vomiting, no hematuria and no loss of consciousness. The symptoms are aggravated by pressure on the injury. Treatment on scene includes a c-collar and a backboard. Treatments tried: fentanyl. The treatment provided mild relief.    Past Medical History  Diagnosis Date  . Diabetes mellitus   . Depression   . Hypertension   . Acid reflux   . Diverticular disease   . Rheumatoid arthritis   . Osteoarthritis   . Stroke   . Bell's palsy   . Hypercholesterolemia     Past Surgical History  Procedure Date  . Appendectomy   . Tonsillectomy   . Cholecystectomy   . Abdominal hysterectomy   . Fracture surgery     No family history on file.  History  Substance Use Topics  . Smoking status: Former Games developer  . Smokeless tobacco: Not on file  . Alcohol Use: 0.5 oz/week    1 drink(s) per week    OB History    Grav Para Term Preterm Abortions TAB SAB Ect Mult Living                  Review of Systems  Constitutional: Negative for fever and fatigue.  HENT: Negative for congestion, drooling and  neck pain.   Eyes: Negative for pain.  Respiratory: Negative for cough and shortness of breath.   Cardiovascular: Negative for chest pain.  Gastrointestinal: Negative for nausea, vomiting, abdominal pain and diarrhea.  Genitourinary: Negative for dysuria and hematuria.  Musculoskeletal: Negative for back pain and gait problem.  Skin: Negative for color change.  Neurological: Positive for headaches (mild). Negative for dizziness and loss of consciousness.  Hematological: Negative for adenopathy.  Psychiatric/Behavioral: Negative for behavioral problems.  All other systems reviewed and are negative.    Allergies  Prednisone  Home Medications   Current Outpatient Rx  Name Route Sig Dispense Refill  . AMLODIPINE BESYLATE 5 MG PO TABS Oral Take 5 mg by mouth daily.      Marland Kitchen VITAMIN C 1000 MG PO TABS Oral Take 1,000 mg by mouth daily.      . ASPIRIN 81 MG PO TABS Oral Take 81 mg by mouth daily.      Marland Kitchen CALCIUM CITRATE-VITAMIN D 200-200 MG-UNIT PO TABS Oral Take 1 tablet by mouth 2 (two) times daily.      Marland Kitchen VITAMIN D 1000 UNITS PO TABS Oral Take 1,000 Units by mouth daily.      . DULOXETINE HCL 30 MG PO CPEP Oral Take 1 capsule (30 mg total) by mouth daily. For depression  30 capsule 0  .  INSULIN GLARGINE 100 UNIT/ML [place] SOLN Subcutaneous Inject 50 Units into the skin at bedtime.      . INSULIN GLULISINE 100 UNIT/ML  SOLN Subcutaneous Inject 10-30 Units into the skin 3 (three) times daily before meals. Sliding scale    . LISINOPRIL 20 MG PO TABS Oral Take 20 mg by mouth daily.      . MELOXICAM 15 MG PO TABS Oral Take 15 mg by mouth daily.      Marland Kitchen PANTOPRAZOLE SODIUM 40 MG PO TBEC Oral Take 40 mg by mouth daily.      Marland Kitchen SIMVASTATIN 40 MG PO TABS Oral Take 40 mg by mouth daily.        There were no vitals taken for this visit.  Physical Exam  Constitutional: She is oriented to person, place, and time. She appears well-developed and well-nourished.  HENT:  Head: Normocephalic.    Mouth/Throat: No oropharyngeal exudate.  Eyes: Conjunctivae and EOM are normal. Pupils are equal, round, and reactive to light.  Neck: Normal range of motion. Neck supple.       No vertebral ttp on exam.  Cardiovascular: Normal rate, regular rhythm, normal heart sounds and intact distal pulses.  Exam reveals no gallop and no friction rub.   No murmur heard. Pulmonary/Chest: Effort normal and breath sounds normal. No respiratory distress. She has no wheezes.  Abdominal: Soft. Bowel sounds are normal. There is no tenderness.  Musculoskeletal: Normal range of motion. She exhibits tenderness (Gross deformity to left ankle. Moderate ttp in this area. Sensation/motor skills intact in LLE. 2+ distal pulses throughout. ). She exhibits no edema.  Neurological: She is alert and oriented to person, place, and time.  Skin: Skin is warm and dry.  Psychiatric: She has a normal mood and affect. Her behavior is normal.    ED Course  Procedures (including critical care time)  Labs Reviewed - No data to display No results found.   No diagnosis found.   Date: 01/16/2012  Rate: 109  Rhythm: sinus tachycardia  QRS Axis: normal  Intervals: QT prolonged  ST/T Wave abnormalities: normal  Conduction Disutrbances:none  Narrative Interpretation: Q wave in inferior leads, No ST or T wave changes cw ischemia  Old EKG Reviewed: none available    MDM  3:47 PM 66 y.o. female pw mechanical fall while walking up stairs. Pt fell backwards approx 10 stairs hitting her head and left ankle. She denies loc. AFVSS here, A/O x3, has gross deformity to left ankle.   Ortho consulted for left ankle fx. Pt splinted. Got CT of ankle per ortho request. Will admit.  Noted dec in Hgb from previous today. Hemeoccult negative.   Consulted medicine for admission.  Clinical Impression 1. Ankle fracture   2. Head injury   3. Madelynn Done, MD 01/16/12 [phone]

## 2012-01-15 NOTE — H&P (Signed)
Tracey Mason is an 66 y.o. female.   Chief Complaint: left ankle pain and deformity HPI: 66 yo female with multiple medical problems who lost her balance and fell down her stairs at home injuring her head and neck and left ankle.  By report the patient's head went through the wall.  She crawled outside and screamed for help.  Transported by EMS to the [facility] ED.  She has an obvious left ankle fracture dislocation.  Past Medical History  Diagnosis Date  . Diabetes mellitus   . Depression   . Hypertension   . Acid reflux   . Diverticular disease   . Rheumatoid arthritis   . Osteoarthritis   . Stroke   . Bell's palsy   . Hypercholesterolemia     Past Surgical History  Procedure Date  . Appendectomy   . Tonsillectomy   . Cholecystectomy   . Abdominal hysterectomy   . Fracture surgery     No family history on file. Social History:  reports that she has quit smoking. She does not have any smokeless tobacco history on file. She reports that she drinks about .5 ounces of alcohol per week. Her drug history not on file.  Allergies:  Allergies  Allergen Reactions  . Prednisone Other (See Comments)    Causes stroke  . Cortisone Other (See Comments)    Causes stroke     (Not in a hospital admission)  No results found for this or any previous visit (from the past 48 hour(s)). Dg Tibia/fibula Left  01/15/2012  *RADIOLOGY REPORT*  Clinical Data: Fall with left ankle pain.  LEFT TIBIA AND FIBULA - 2 VIEW  Comparison: None.  Findings: There is a comminuted fracture of the distal fibular shaft, with apex medial angulation of the fracture fragments. Displaced medial malleolar fracture is seen as well.  There is disruption of the ankle mortise, with dorsal displacement of the talus and foot.  IMPRESSION: Ankle fracture dislocation involving the medial malleolus and distal fibular shaft.  Original Report Authenticated By: Reyes Ivan, M.D.   Ct Head Wo Contrast  01/15/2012  *RADIOLOGY  REPORT*  Clinical Data:  Fall.  CT HEAD WITHOUT CONTRAST CT CERVICAL SPINE WITHOUT CONTRAST  Technique:  Multidetector CT imaging of the head and cervical spine was performed following the standard protocol without intravenous contrast.  Multiplanar CT image reconstructions of the cervical spine were also generated.  Comparison:  None.  CT HEAD  Findings: No acute intracranial abnormality.  Specifically, no hemorrhage, hydrocephalus, mass lesion, acute infarction, or significant intracranial injury.  No acute calvarial abnormality. Low density areas in the basal ganglia bilaterally, likely old lacunar infarcts.  Air-fluid level in the right maxillary sinus and adjacent ethmoid air cells.  Mastoids are clear.  IMPRESSION: No acute intracranial abnormality.  Acute right paranasal sinusitis.  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  Prevertebral soft tissues are normal.  There is mild levoscoliosis.  No fracture visualized.  No epidural or paraspinal hematoma.  IMPRESSION: Severe degenerative changes.  No acute findings.  Original Report Authenticated By: Cyndie Chime, M.D.   Ct Cervical Spine Wo Contrast  01/15/2012  *RADIOLOGY REPORT*  Clinical Data:  Fall.  CT HEAD WITHOUT CONTRAST CT CERVICAL SPINE WITHOUT CONTRAST  Technique:  Multidetector CT imaging of the head and cervical spine was performed following the standard protocol without intravenous contrast.  Multiplanar CT image reconstructions of the cervical spine were also generated.  Comparison:  None.  CT HEAD  Findings: No acute intracranial abnormality.  Specifically, no hemorrhage, hydrocephalus, mass lesion, acute infarction, or significant intracranial injury.  No acute calvarial abnormality. Low density areas in the basal ganglia bilaterally, likely old lacunar infarcts.  Air-fluid level in the right maxillary sinus and adjacent ethmoid air cells.  Mastoids are clear.   IMPRESSION: No acute intracranial abnormality.  Acute right paranasal sinusitis.  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  Prevertebral soft tissues are normal.  There is mild levoscoliosis.  No fracture visualized.  No epidural or paraspinal hematoma.  IMPRESSION: Severe degenerative changes.  No acute findings.  Original Report Authenticated By: Cyndie Chime, M.D.   Dg Foot 2 Views Left  01/15/2012  *RADIOLOGY REPORT*  Clinical Data: Fall with ankle pain and swelling.  LEFT FOOT - 2 VIEW  Comparison: None.  Findings: There is disruption of the ankle mortise, with dorsal displacement of the talus and foot.  Medial malleolar fracture is displaced.  A comminuted distal fibular shaft fracture is partially imaged.  Osseous structures may be mildly osteopenic.  There is a fracture involving the neck of the fifth proximal phalanx. Calcaneal spurs.  IMPRESSION:  1.  Ankle fracture dislocation with fractures involving the medial malleolus and distal fibular shaft. 2.  Fifth proximal phalangeal fracture.  Original Report Authenticated By: Reyes Ivan, M.D.    ROS  Blood pressure 144/66, pulse 114, temperature 97.6 F (36.4 C), temperature source Oral, resp. rate 24, SpO2 93.00%. Physical Exam  Elderly female in moderate distress with c-collar in place.  Neck minimally tender with good ROM.  Trachea midline.  Chest: nontender to compression.  Abdomen: scaphoid and soft, nontender.  Pelvis stable to rock.  Bilateral UEs: pain free full ROM.  Left LE, hip and knee ROM pain free.  Ankle with obvious posteriolateral fracture dislocation.  Skin tented medially.  NVI  R LE, pain free ROM hip, knee, ankle   Assessment/Plan Left ankle complex fracture dislocation.  Reduced emergently in ED.  Splint applied.  NVI.  CT ordered Labs ordered for admission. Internal Medicine consulted for admit vs follow with [place].  Thanks!!! Ortho plan to  follow.  No surgery tonight unless unable to hold adequate reduction. Will consult with ortho foot and ankle specialist.  Rainbow Salman,STEVEN R 01/15/2012, 7:02 PM

## 2012-01-16 DIAGNOSIS — S82209A Unspecified fracture of shaft of unspecified tibia, initial encounter for closed fracture: Secondary | ICD-10-CM

## 2012-01-16 DIAGNOSIS — E1165 Type 2 diabetes mellitus with hyperglycemia: Secondary | ICD-10-CM

## 2012-01-16 DIAGNOSIS — I10 Essential (primary) hypertension: Secondary | ICD-10-CM

## 2012-01-16 DIAGNOSIS — S82409A Unspecified fracture of shaft of unspecified fibula, initial encounter for closed fracture: Secondary | ICD-10-CM

## 2012-01-16 DIAGNOSIS — M069 Rheumatoid arthritis, unspecified: Secondary | ICD-10-CM

## 2012-01-16 LAB — BASIC METABOLIC PANEL
CO2: 27 mEq/L (ref 19–32)
Calcium: 8.5 mg/dL (ref 8.4–10.5)
Creatinine, Ser: 0.93 mg/dL (ref 0.50–1.10)
GFR calc non Af Amer: 62 mL/min — ABNORMAL LOW (ref >90)
Glucose, Bld: 138 mg/dL — ABNORMAL HIGH (ref 70–99)

## 2012-01-16 LAB — GLUCOSE, CAPILLARY
Glucose-Capillary: 141 mg/dL — ABNORMAL HIGH (ref 70–99)
Glucose-Capillary: 160 mg/dL — ABNORMAL HIGH (ref 70–99)
Glucose-Capillary: 161 mg/dL — ABNORMAL HIGH (ref 70–99)
Glucose-Capillary: 165 mg/dL — ABNORMAL HIGH (ref 70–99)
Glucose-Capillary: 175 mg/dL — ABNORMAL HIGH (ref 70–99)
Glucose-Capillary: 226 mg/dL — ABNORMAL HIGH (ref 70–99)

## 2012-01-16 LAB — SURGICAL PCR SCREEN
MRSA, PCR: NEGATIVE
Staphylococcus aureus: NEGATIVE

## 2012-01-16 LAB — CBC
MCH: 31.2 pg (ref 26.0–34.0)
MCV: 93.5 fL (ref 78.0–100.0)
Platelets: 220 10*3/uL (ref 150–400)
RDW: 13 % (ref 11.5–15.5)
WBC: 6 10*3/uL (ref 4.0–10.5)

## 2012-01-16 MED ORDER — HYDROCODONE-ACETAMINOPHEN 5-325 MG PO TABS
1.0000 | ORAL_TABLET | Freq: Four times a day (QID) | ORAL | Status: AC
  Administered 2012-01-16 – 2012-01-18 (×8): 1 via ORAL
  Filled 2012-01-16 (×8): qty 1

## 2012-01-16 MED ORDER — PSEUDOEPHEDRINE HCL 60 MG PO TABS
60.0000 mg | ORAL_TABLET | Freq: Two times a day (BID) | ORAL | Status: DC
  Administered 2012-01-16 – 2012-01-20 (×9): 60 mg via ORAL
  Filled 2012-01-16 (×11): qty 1

## 2012-01-16 MED ORDER — MORPHINE SULFATE 2 MG/ML IJ SOLN
INTRAMUSCULAR | Status: AC
  Administered 2012-01-16: 2 mg via INTRAVENOUS
  Filled 2012-01-16: qty 1

## 2012-01-16 MED ORDER — CEFAZOLIN SODIUM-DEXTROSE 2-3 GM-% IV SOLR
2.0000 g | INTRAVENOUS | Status: AC
  Administered 2012-01-17: 2 g via INTRAVENOUS
  Filled 2012-01-16: qty 50

## 2012-01-16 MED ORDER — GUAIFENESIN ER 600 MG PO TB12
600.0000 mg | ORAL_TABLET | Freq: Two times a day (BID) | ORAL | Status: DC
  Administered 2012-01-16 – 2012-01-20 (×9): 600 mg via ORAL
  Filled 2012-01-16 (×11): qty 1

## 2012-01-16 NOTE — Progress Notes (Signed)
Subjective: Pt c/o L ankle pain that is dull and aching.  SPlinted since provisional reduction in ER yesterday by Dr. Ranell Patrick.  Pt denies any h/o previous L ankle injury.  + h/o diabetes.  Fell downstairs yesrterday injuring her left ankle.  Objective: Vital signs in last 24 hours: Temp:  [97.6 F (36.4 C)-98.5 F (36.9 C)] 98.4 F (36.9 C) Feb 05, 2023 0447) Pulse Rate:  [97-120] 104  02-05-2023 0447) Resp:  [14-24] 18  02-05-2023 0447) BP: (104-144)/(55-91) 127/68 mmHg February 05, 2023 0447) SpO2:  [92 %-98 %] 96 % 02-05-23 0447)  Intake/Output from previous day:   Intake/Output this shift: Total I/O In: 240 [P.O.:240] Out: -    Basename February 05, 2012 0500 01/15/12 1852  HGB 8.1* 9.6*    Basename 05-Feb-2012 0500 01/15/12 1852  WBC 6.0 10.9*  RBC 2.60* 3.07*  HCT 24.3* 28.1*  PLT 220 252    Basename 02/05/12 0500 01/15/12 1852  NA 139 136  K 3.9 4.5  CL 103 101  CO2 27 26  BUN 15 15  CREATININE 0.93 1.04  GLUCOSE 138* 154*  CALCIUM 8.5 9.1    Basename 01/15/12 2235  LABPT --  INR 1.03    left ankle splinted.  minimal swelling seen at forefoot.  Wiggles toes and feels LT at forefoot.  Brisk cap refill at toes.  CT scan:  L ankle with comminuted distal tibia fracture as well as segmental fibular shaft fx above the syndesmosis  Assessment/Plan: L distal tibia and fibula fractures - to OR for ORIF of tibia, fibula and syndesmosis.  The risks and benefits of the alternative treatment options have been discussed in detail.  The patient wishes to proceed with surgery and specifically understands risks of bleeding, infection, nerve damage, blood clots, need for additional surgery, amputation and death.    Toni Arthurs 02/05/12, 1:15 PM

## 2012-01-16 NOTE — ED Provider Notes (Signed)
I saw and evaluated the patient, reviewed the resident's note and I agree with the findings and plan.  Pt with most likely mechanical fall.  EMS, ccollar, board.  Pt with left ankel deformity.  Unstable fracture evident.  Ortho consulted and will admit for surgery.  Mild anemia, hemoccult neg.   PT with h/o HTN, CVA, however BP, electrolytes are ok, no symptoms or signs of immediate cardiac or cerebrovascular abn's.  [facility] consulted to see pt as well.  Will be admitted.  Head CT and C spine CT's neg.  CT of ankle ordered at request of orthopedics for operative characterization.  Gavin Pound. Seraya Jobst, MD 01/16/12 1430

## 2012-01-16 NOTE — Progress Notes (Signed)
Utilization review completed. Chanc Kervin, RN, BSN.  01/16/12 

## 2012-01-16 NOTE — Plan of Care (Signed)
Problem: Phase I Progression Outcomes Goal: Voiding-avoid urinary catheter unless indicated Outcome: Not Met (add Reason) Foley Catheter in place for surgery in Am

## 2012-01-16 NOTE — Progress Notes (Signed)
Subjective: Troubled by LLE pain.  Objective: Vital signs in last 24 hours: Temp:  [97.6 F (36.4 C)-98.5 F (36.9 C)] 98.4 F (36.9 C) (05/24 0447) Pulse Rate:  [97-120] 104  (05/24 0447) Resp:  [14-24] 18  (05/24 0447) BP: (104-144)/(55-91) 127/68 mmHg (05/24 0447) SpO2:  [92 %-98 %] 96 % (05/24 0447) Weight change:  Last BM Date: 01/14/12  Intake/Output from previous day:       Physical Exam: General: Not in acute distress, alert, communicative, fully oriented, not short of breath at rest.  HEENT:  Mild clinical pallor, no jaundice, no conjunctival injection or discharge. Hydration status is fair. NECK:  Supple, JVP not seen, no carotid bruits, no palpable lymphadenopathy, no palpable goiter. CHEST:  Clinically clear to auscultation, no wheezes, no crackles. HEART:  Sounds 1 and 2 heard, normal, regular, no murmurs. ABDOMEN:  Moderately obese, soft, non-tender, no palpable organomegaly, no palpable masses, normal bowel sounds. GENITALIA:  Not examined. LOWER EXTREMITIES:  Right LE,  no pitting edema, palpable peripheral pulses. Left LE, under ace bandaging. MUSCULOSKELETAL SYSTEM:  Generalized osteoarthritic changes, otherwise, as above. CENTRAL NERVOUS SYSTEM:  No focal neurologic deficit on gross examination.  Lab Results:  Basename 01/16/12 0500 01/15/12 1852  WBC 6.0 10.9*  HGB 8.1* 9.6*  HCT 24.3* 28.1*  PLT 220 252    Basename 01/16/12 0500 01/15/12 1852  NA 139 136  K 3.9 4.5  CL 103 101  CO2 27 26  GLUCOSE 138* 154*  BUN 15 15  CREATININE 0.93 1.04  CALCIUM 8.5 9.1   No results found for this or any previous visit (from the past 240 hour(s)).   Studies/Results: Dg Tibia/fibula Left  01/15/2012  *RADIOLOGY REPORT*  Clinical Data: Fall with left ankle pain.  LEFT TIBIA AND FIBULA - 2 VIEW  Comparison: None.  Findings: There is a comminuted fracture of the distal fibular shaft, with apex medial angulation of the fracture fragments. Displaced medial  malleolar fracture is seen as well.  There is disruption of the ankle mortise, with dorsal displacement of the talus and foot.  IMPRESSION: Ankle fracture dislocation involving the medial malleolus and distal fibular shaft.  Original Report Authenticated By: Reyes Ivan, M.D.   Ct Head Wo Contrast  01/15/2012  *RADIOLOGY REPORT*  Clinical Data:  Fall.  CT HEAD WITHOUT CONTRAST CT CERVICAL SPINE WITHOUT CONTRAST  Technique:  Multidetector CT imaging of the head and cervical spine was performed following the standard protocol without intravenous contrast.  Multiplanar CT image reconstructions of the cervical spine were also generated.  Comparison:  None.  CT HEAD  Findings: No acute intracranial abnormality.  Specifically, no hemorrhage, hydrocephalus, mass lesion, acute infarction, or significant intracranial injury.  No acute calvarial abnormality. Low density areas in the basal ganglia bilaterally, likely old lacunar infarcts.  Air-fluid level in the right maxillary sinus and adjacent ethmoid air cells.  Mastoids are clear.  IMPRESSION: No acute intracranial abnormality.  Acute right paranasal sinusitis.  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  Prevertebral soft tissues are normal.  There is mild levoscoliosis.  No fracture visualized.  No epidural or paraspinal hematoma.  IMPRESSION: Severe degenerative changes.  No acute findings.  Original Report Authenticated By: Cyndie Chime, M.D.   Ct Cervical Spine Wo Contrast  01/15/2012  *RADIOLOGY REPORT*  Clinical Data:  Fall.  CT HEAD WITHOUT CONTRAST CT CERVICAL SPINE WITHOUT CONTRAST  Technique:  Multidetector CT imaging of  the head and cervical spine was performed following the standard protocol without intravenous contrast.  Multiplanar CT image reconstructions of the cervical spine were also generated.  Comparison:  None.  CT HEAD  Findings: No acute intracranial abnormality.   Specifically, no hemorrhage, hydrocephalus, mass lesion, acute infarction, or significant intracranial injury.  No acute calvarial abnormality. Low density areas in the basal ganglia bilaterally, likely old lacunar infarcts.  Air-fluid level in the right maxillary sinus and adjacent ethmoid air cells.  Mastoids are clear.  IMPRESSION: No acute intracranial abnormality.  Acute right paranasal sinusitis.  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  Prevertebral soft tissues are normal.  There is mild levoscoliosis.  No fracture visualized.  No epidural or paraspinal hematoma.  IMPRESSION: Severe degenerative changes.  No acute findings.  Original Report Authenticated By: Cyndie Chime, M.D.   Ct Ankle Left Wo Contrast  01/15/2012  *RADIOLOGY REPORT*  Clinical Data: Preoperative evaluation in patient with left ankle fracture dislocation.  CT OF THE LEFT ANKLE WITHOUT CONTRAST  Technique:  Multidetector CT imaging was performed according to the standard protocol. Multiplanar CT image reconstructions were also generated.  Comparison: Left ankle x-rays obtained earlier same date.  Findings: Comminuted, multipart fracture involving the distal fibula, approximately 6-7 cm above the tip of the lateral malleolus.  Comminuted transverse fracture of the medial malleolus, with slight medial displacement of the distal fragment.  Obliquely oriented fracture involving the lateral aspect of the distal tibia. Vertically oriented comminuted fracture involving the posterior malleolus with slight displacement.  Ankle mortise intact with narrowing of the joint space.  No fractures involving the talus or calcaneus.  Tiny plantar calcaneal spur.  Small spur/and spleen. Spur/enthesopathy at the insertion of the Achilles tendon on the posterior calcaneus.  Focal enlargement and associated calcification involving what I believe is the tibialis posterior tendon in the  hindfoot.  Large ankle joint effusion/hemarthrosis.  IMPRESSION:  1.  Comminuted multipart fracture involving the distal fibula approximately 6-7 cm above the tip of the lateral malleolus. 2.  Fractures involving the medial and posterior malleoli of the distal tibia. 3.  Fracture involving the lateral aspect of the distal tibia. 4.  Possible chronic tendonitis/bursitis involving what I believe is the tibialis posterior tendon (focal enlargement and associated calcification). 5.  Intact ankle mortise with narrowed joint space.  Original Report Authenticated By: Arnell Sieving, M.D.   Dg Foot 2 Views Left  01/15/2012  *RADIOLOGY REPORT*  Clinical Data: Fall with ankle pain and swelling.  LEFT FOOT - 2 VIEW  Comparison: None.  Findings: There is disruption of the ankle mortise, with dorsal displacement of the talus and foot.  Medial malleolar fracture is displaced.  A comminuted distal fibular shaft fracture is partially imaged.  Osseous structures may be mildly osteopenic.  There is a fracture involving the neck of the fifth proximal phalanx. Calcaneal spurs.  IMPRESSION:  1.  Ankle fracture dislocation with fractures involving the medial malleolus and distal fibular shaft. 2.  Fifth proximal phalangeal fracture.  Original Report Authenticated By: Reyes Ivan, M.D.    Medications: Scheduled Meds:   . sodium chloride   Intravenous Once  . sodium chloride   Intravenous Once  . amLODipine  5 mg Oral Daily  . amoxicillin  500 mg Oral TID  . DULoxetine  30 mg Oral Daily  . enoxaparin  40 mg Subcutaneous Q24H  . pseudoephedrine  60 mg Oral BID  And  . guaiFENesin  600 mg Oral BID  . insulin aspart  0-15 Units Subcutaneous Q4H  . insulin aspart  0-5 Units Subcutaneous QHS  . insulin glargine  25 Units Subcutaneous QHS  . lisinopril  20 mg Oral Daily  .  morphine injection  2 mg Intravenous Once  .  morphine injection  4 mg Intravenous STAT  . ondansetron      . ondansetron (ZOFRAN) IV  4  mg Intravenous Once  . pantoprazole  40 mg Oral Q1200  . DISCONTD: pseudoephedrine-guaifenesin  1 tablet Oral Q12H   Continuous Infusions:   . sodium chloride     PRN Meds:.acetaminophen, acetaminophen, alum & mag hydroxide-simeth, HYDROcodone-acetaminophen, morphine injection, ondansetron (ZOFRAN) IV, ondansetron  Assessment/Plan:  Principal Problem:  *Tibia/fibula fracture: This is a complex fracture, secondary to a mechanical fall. Dr Malon Kindle provided orthopedic consultation, and patient is scheduled for surgery, although precise timing is to be determined. Meanwhile, LLE has been immobilized, and concentration is on maintaining adequate analgesia. Active Problems:  1. Diabetes mellitus: CBGs appear reasonably controlled at this time. We are managing with diet, SSI and Lantus. Check HBA1C for completeness.  2. Hypertension: This is controlled on pre-admission antihypertensives.  3. Depression: Mood is stable.  4. GERD (gastroesophageal reflux disease): Asymptomatic on PPI.  5. Rheumatoid arthritis: Stable/Quiescent.  6. H/O: CVA (cardiovascular accident)/Bell's palsy: Minimal deficit.   7. Recent Oral/Sinus Surgery: This was about one week ago. Patient is on Amoxicillin, Mucinex, which we shall continue.   8. Anemia: This appears to be due to acute blood loss from fracture, on anemia of chronic disease. We shall follow CBC, and transfuse prn.  Disposition: Per ortho.   LOS: 1 day   Oluwanifemi Susman,CHRISTOPHER 01/16/2012, 9:18 AM

## 2012-01-16 NOTE — Progress Notes (Signed)
CARE MANAGEMENT NOTE 01/16/2012    Patient:  Tracey Mason, Tracey Mason   Account Number:  [id number]  Date Initiated:  01/16/2012  Documentation initiated by:  Vance Peper  Subjective/Objective Assessment:   66 yr old female s/p fall at home. Spoke with patient's daughter. Patient will go to OR on Saturday. States she will need SNF at discharge. Prefers [facility]. Will notify Child psychotherapist, will follow.

## 2012-01-17 ENCOUNTER — Encounter (HOSPITAL_COMMUNITY): Payer: Self-pay | Admitting: Anesthesiology

## 2012-01-17 ENCOUNTER — Inpatient Hospital Stay (HOSPITAL_COMMUNITY): Payer: Medicare Other

## 2012-01-17 ENCOUNTER — Inpatient Hospital Stay (HOSPITAL_COMMUNITY): Payer: Medicare Other | Admitting: Anesthesiology

## 2012-01-17 ENCOUNTER — Encounter (HOSPITAL_COMMUNITY)
Admission: EM | Disposition: A | Discharge status: Skilled Nursing Facility | Payer: Self-pay | Source: Home / Self Care | Attending: Internal Medicine

## 2012-01-17 DIAGNOSIS — S82209A Unspecified fracture of shaft of unspecified tibia, initial encounter for closed fracture: Secondary | ICD-10-CM

## 2012-01-17 DIAGNOSIS — S82409A Unspecified fracture of shaft of unspecified fibula, initial encounter for closed fracture: Secondary | ICD-10-CM

## 2012-01-17 DIAGNOSIS — M069 Rheumatoid arthritis, unspecified: Secondary | ICD-10-CM

## 2012-01-17 DIAGNOSIS — E1165 Type 2 diabetes mellitus with hyperglycemia: Secondary | ICD-10-CM

## 2012-01-17 DIAGNOSIS — I10 Essential (primary) hypertension: Secondary | ICD-10-CM

## 2012-01-17 LAB — BASIC METABOLIC PANEL
BUN: 9 mg/dL (ref 6–23)
Calcium: 8.6 mg/dL (ref 8.4–10.5)
Creatinine, Ser: 0.76 mg/dL (ref 0.50–1.10)
GFR calc non Af Amer: 85 mL/min — ABNORMAL LOW (ref >90)
Glucose, Bld: 208 mg/dL — ABNORMAL HIGH (ref 70–99)
Sodium: 137 mEq/L (ref 135–145)

## 2012-01-17 LAB — GLUCOSE, CAPILLARY
Glucose-Capillary: 151 mg/dL — ABNORMAL HIGH (ref 70–99)
Glucose-Capillary: 155 mg/dL — ABNORMAL HIGH (ref 70–99)
Glucose-Capillary: 161 mg/dL — ABNORMAL HIGH (ref 70–99)
Glucose-Capillary: 176 mg/dL — ABNORMAL HIGH (ref 70–99)
Glucose-Capillary: 183 mg/dL — ABNORMAL HIGH (ref 70–99)
Glucose-Capillary: 246 mg/dL — ABNORMAL HIGH (ref 70–99)

## 2012-01-17 LAB — CBC
HCT: 25.9 % — ABNORMAL LOW (ref 36.0–46.0)
Hemoglobin: 8.6 g/dL — ABNORMAL LOW (ref 12.0–15.0)
MCH: 30.7 pg (ref 26.0–34.0)
MCHC: 33.2 g/dL (ref 30.0–36.0)
MCV: 92.5 fL (ref 78.0–100.0)

## 2012-01-17 SURGERY — OPEN REDUCTION INTERNAL FIXATION (ORIF) TIBIA/FIBULA FRACTURE
Anesthesia: Regional | Site: Ankle | Laterality: Left | Wound class: Clean

## 2012-01-17 MED ORDER — METOCLOPRAMIDE HCL 5 MG/ML IJ SOLN: 5.0000 mg | Freq: Three times a day (TID) | INTRAMUSCULAR | Status: DC | PRN

## 2012-01-17 MED ORDER — PHENYLEPHRINE HCL 10 MG/ML IJ SOLN
INTRAMUSCULAR | Status: DC | PRN
  Administered 2012-01-17: 80 ug via INTRAVENOUS
  Administered 2012-01-17 (×2): 40 ug via INTRAVENOUS

## 2012-01-17 MED ORDER — LACTATED RINGERS IV SOLN
INTRAVENOUS | Status: DC | PRN
  Administered 2012-01-17: 10:00:00 via INTRAVENOUS

## 2012-01-17 MED ORDER — BUPIVACAINE-EPINEPHRINE PF 0.5-1:200000 % IJ SOLN
INTRAMUSCULAR | Status: DC | PRN
  Administered 2012-01-17: 30 mL

## 2012-01-17 MED ORDER — SENNA 8.6 MG PO TABS
1.0000 | ORAL_TABLET | Freq: Two times a day (BID) | ORAL | Status: DC
  Administered 2012-01-18 – 2012-01-19 (×3): 8.6 mg via ORAL
  Filled 2012-01-17 (×7): qty 1

## 2012-01-17 MED ORDER — HYDROMORPHONE HCL PF 1 MG/ML IJ SOLN: 0.5000 mg | INTRAMUSCULAR | Status: DC | PRN

## 2012-01-17 MED ORDER — LIDOCAINE HCL (CARDIAC) 20 MG/ML IV SOLN
INTRAVENOUS | Status: DC | PRN
  Administered 2012-01-17: 100 mg via INTRAVENOUS

## 2012-01-17 MED ORDER — FENTANYL CITRATE 0.05 MG/ML IJ SOLN
INTRAMUSCULAR | Status: DC | PRN
  Administered 2012-01-17 (×2): 50 ug via INTRAVENOUS
  Administered 2012-01-17: 25 ug via INTRAVENOUS
  Administered 2012-01-17 (×2): 50 ug via INTRAVENOUS
  Administered 2012-01-17: 25 ug via INTRAVENOUS

## 2012-01-17 MED ORDER — SODIUM CHLORIDE 0.9 % IV SOLN: INTRAVENOUS | Status: DC

## 2012-01-17 MED ORDER — ONDANSETRON HCL 4 MG PO TABS: 4.0000 mg | ORAL_TABLET | Freq: Four times a day (QID) | ORAL | Status: DC | PRN

## 2012-01-17 MED ORDER — DOCUSATE SODIUM 100 MG PO CAPS
100.0000 mg | ORAL_CAPSULE | Freq: Two times a day (BID) | ORAL | Status: DC
  Administered 2012-01-18 – 2012-01-20 (×5): 100 mg via ORAL
  Filled 2012-01-17 (×6): qty 1

## 2012-01-17 MED ORDER — ONDANSETRON HCL 4 MG/2ML IJ SOLN: 4.0000 mg | Freq: Once | INTRAMUSCULAR | Status: DC | PRN

## 2012-01-17 MED ORDER — METOCLOPRAMIDE HCL 10 MG PO TABS: 5.0000 mg | ORAL_TABLET | Freq: Three times a day (TID) | ORAL | Status: DC | PRN

## 2012-01-17 MED ORDER — SUCCINYLCHOLINE CHLORIDE 20 MG/ML IJ SOLN
INTRAMUSCULAR | Status: DC | PRN
  Administered 2012-01-17: 180 mg via INTRAVENOUS

## 2012-01-17 MED ORDER — ONDANSETRON HCL 4 MG/2ML IJ SOLN
INTRAMUSCULAR | Status: DC | PRN
  Administered 2012-01-17: 4 mg via INTRAVENOUS

## 2012-01-17 MED ORDER — ONDANSETRON HCL 4 MG/2ML IJ SOLN: 4.0000 mg | Freq: Four times a day (QID) | INTRAMUSCULAR | Status: DC | PRN

## 2012-01-17 MED ORDER — HYDROMORPHONE HCL PF 1 MG/ML IJ SOLN: 0.2500 mg | INTRAMUSCULAR | Status: DC | PRN

## 2012-01-17 MED ORDER — PROPOFOL 10 MG/ML IV EMUL
INTRAVENOUS | Status: DC | PRN
  Administered 2012-01-17: 150 mg via INTRAVENOUS

## 2012-01-17 MED ORDER — 0.9 % SODIUM CHLORIDE (POUR BTL) OPTIME
TOPICAL | Status: DC | PRN
  Administered 2012-01-17: 1000 mL

## 2012-01-17 SURGICAL SUPPLY — 68 items
BANDAGE ESMARK 6X9 LF (GAUZE/BANDAGES/DRESSINGS) ×1 IMPLANT
BIT DRILL 2.5X2.75 QC CALB (BIT) ×1 IMPLANT
BLADE SURG 15 STRL LF DISP TIS (BLADE) ×1 IMPLANT
BLADE SURG 15 STRL SS (BLADE) ×2
BNDG CMPR 9X6 STRL LF SNTH (GAUZE/BANDAGES/DRESSINGS) ×1
BNDG COHESIVE 4X5 TAN STRL (GAUZE/BANDAGES/DRESSINGS) ×2 IMPLANT
BNDG COHESIVE 6X5 TAN STRL LF (GAUZE/BANDAGES/DRESSINGS) ×2 IMPLANT
BNDG ESMARK 6X9 LF (GAUZE/BANDAGES/DRESSINGS) ×2
CHLORAPREP W/TINT 26ML (MISCELLANEOUS) ×2 IMPLANT
CLOTH BEACON ORANGE TIMEOUT ST (SAFETY) ×2 IMPLANT
COVER SURGICAL LIGHT HANDLE (MISCELLANEOUS) ×2 IMPLANT
CUFF TOURNIQUET SINGLE 34IN LL (TOURNIQUET CUFF) ×2 IMPLANT
CUFF TOURNIQUET SINGLE 44IN (TOURNIQUET CUFF) IMPLANT
DRAPE C-ARM 42X72 X-RAY (DRAPES) ×2 IMPLANT
DRAPE C-ARMOR (DRAPES) ×2 IMPLANT
DRAPE INCISE IOBAN 66X45 STRL (DRAPES) ×2 IMPLANT
DRAPE ORTHO SPLIT 77X108 STRL (DRAPES) ×2
DRAPE SURG ORHT 6 SPLT 77X108 (DRAPES) ×1 IMPLANT
DRAPE U-SHAPE 47X51 STRL (DRAPES) ×2 IMPLANT
DRSG ADAPTIC 3X8 NADH LF (GAUZE/BANDAGES/DRESSINGS) ×1 IMPLANT
DRSG PAD ABDOMINAL 8X10 ST (GAUZE/BANDAGES/DRESSINGS) ×3 IMPLANT
ELECT REM PT RETURN 9FT ADLT (ELECTROSURGICAL) ×2
ELECTRODE REM PT RTRN 9FT ADLT (ELECTROSURGICAL) ×1 IMPLANT
GLOVE BIO SURGEON STRL SZ8 (GLOVE) ×2 IMPLANT
GLOVE BIOGEL PI IND STRL 8 (GLOVE) ×1 IMPLANT
GLOVE BIOGEL PI INDICATOR 8 (GLOVE) ×1
GOWN PREVENTION PLUS XLARGE (GOWN DISPOSABLE) ×2 IMPLANT
GOWN STRL NON-REIN LRG LVL3 (GOWN DISPOSABLE) ×4 IMPLANT
K-WIRE ACE 1.6X6 (WIRE) ×4
KIT BASIN OR (CUSTOM PROCEDURE TRAY) ×2 IMPLANT
KIT ROOM TURNOVER OR (KITS) ×2 IMPLANT
KWIRE ACE 1.6X6 (WIRE) IMPLANT
MANIFOLD NEPTUNE II (INSTRUMENTS) ×2 IMPLANT
NEEDLE 22X1 1/2 (OR ONLY) (NEEDLE) IMPLANT
NS IRRIG 1000ML POUR BTL (IV SOLUTION) ×2 IMPLANT
PACK ORTHO EXTREMITY (CUSTOM PROCEDURE TRAY) ×2 IMPLANT
PAD ARMBOARD 7.5X6 YLW CONV (MISCELLANEOUS) ×4 IMPLANT
PAD CAST 4YDX4 CTTN HI CHSV (CAST SUPPLIES) ×1 IMPLANT
PADDING CAST ABS 4INX4YD NS (CAST SUPPLIES) ×1
PADDING CAST ABS COTTON 4X4 ST (CAST SUPPLIES) IMPLANT
PADDING CAST COTTON 4X4 STRL (CAST SUPPLIES) ×2
PLATE FIBULAR COMP LOCK 10H (Plate) ×1 IMPLANT
SCREW ACE CAN 4.0 38M (Screw) ×1 IMPLANT
SCREW ACE CAN 4.0 44M (Screw) ×2 IMPLANT
SCREW LOCK CORT STAR 3.5X10 (Screw) ×1 IMPLANT
SCREW LOCK CORT STAR 3.5X12 (Screw) ×2 IMPLANT
SCREW LOW PROFILE 12MMX3.5MM (Screw) ×1 IMPLANT
SCREW LOW PROFILE 3.5X48MM (Screw) ×1 IMPLANT
SCREW LP 3.5 (Screw) ×1 IMPLANT
SCREW NON LOCKING LP 3.5 14MM (Screw) ×2 IMPLANT
SPLINT PLASTER CAST XFAST 5X30 (CAST SUPPLIES) IMPLANT
SPLINT PLASTER XFAST SET 5X30 (CAST SUPPLIES) ×1
SPONGE GAUZE 4X4 12PLY (GAUZE/BANDAGES/DRESSINGS) ×1 IMPLANT
SPONGE LAP 18X18 X RAY DECT (DISPOSABLE) ×2 IMPLANT
STAPLER VISISTAT 35W (STAPLE) IMPLANT
SUCTION FRAZIER TIP 10 FR DISP (SUCTIONS) ×2 IMPLANT
SUT PROLENE 3 0 PS 2 (SUTURE) ×2 IMPLANT
SUT VIC AB 2-0 CT1 27 (SUTURE) ×4
SUT VIC AB 2-0 CT1 TAPERPNT 27 (SUTURE) ×2 IMPLANT
SUT VIC AB 3-0 PS2 18 (SUTURE) ×2
SUT VIC AB 3-0 PS2 18XBRD (SUTURE) ×1 IMPLANT
SYR CONTROL 10ML LL (SYRINGE) IMPLANT
TOWEL OR 17X24 6PK STRL BLUE (TOWEL DISPOSABLE) ×2 IMPLANT
TOWEL OR 17X26 10 PK STRL BLUE (TOWEL DISPOSABLE) ×2 IMPLANT
TUBE CONNECTING 12X1/4 (SUCTIONS) ×2 IMPLANT
WASHER FLAT ACE (Orthopedic Implant) ×1 IMPLANT
WASHER PLAIN FLAT ACE NS 3PK (Orthopedic Implant) IMPLANT
WATER STERILE IRR 1000ML POUR (IV SOLUTION) ×2 IMPLANT

## 2012-01-17 NOTE — Anesthesia Preprocedure Evaluation (Addendum)
Anesthesia Evaluation  Patient identified by MRN, date of birth, ID band Patient awake, Patient confused and Patient unresponsive    Reviewed: Allergy & Precautions, H&P , NPO status , Patient's Chart, lab work & pertinent test results  Airway Mallampati: II TM Distance: >3 FB Neck ROM: Full    Dental  (+) Dental Advisory Given and Teeth Intact   Pulmonary  breath sounds clear to auscultation        Cardiovascular hypertension, Pt. on medications Rhythm:Regular Rate:Normal     Neuro/Psych CVA    GI/Hepatic   Endo/Other  Diabetes mellitus-, Type 1, Insulin DependentDM for 25 yrs  Renal/GU      Musculoskeletal  (+) Arthritis -,   Abdominal   Peds  Hematology   Anesthesia Other Findings   Reproductive/Obstetrics                         Anesthesia Physical Anesthesia Plan  ASA: III  Anesthesia Plan: General   Post-op Pain Management: MAC Combined w/ Regional for Post-op pain   Induction: Intravenous  Airway Management Planned: Oral ETT  Additional Equipment:   Intra-op Plan:   Post-operative Plan: Extubation in OR  Informed Consent: I have reviewed the patients History and Physical, chart, labs and discussed the procedure including the risks, benefits and alternatives for the proposed anesthesia with the patient or authorized representative who has indicated his/her understanding and acceptance.   Dental advisory given  Plan Discussed with: CRNA, Anesthesiologist and Surgeon  Anesthesia Plan Comments:        Anesthesia Quick Evaluation

## 2012-01-17 NOTE — Progress Notes (Signed)
Subjective: Just returned from OR.  Objective: Vital signs in last 24 hours: Temp:  [97.6 F (36.4 C)-99 F (37.2 C)] 97.6 F (36.4 C) (05/25 1430) Pulse Rate:  [85-109] 108  (05/25 1440) Resp:  [12-20] 17  (05/25 1440) BP: (108-142)/(52-73) 133/52 mmHg (05/25 1435) SpO2:  [91 %-95 %] 95 % (05/25 1440) Weight change:  Last BM Date: 01/14/12  Intake/Output from previous day: 05/24 0701 - 05/25 0700 In: 1933.8 [P.O.:1200; I.V.:733.8] Out: -  Total I/O In: 1500 [I.V.:1500] Out: 2920 [Urine:2900; Blood:20]   Physical Exam: General: Not in acute distress, alert, communicative, fully oriented, not short of breath at rest.  HEENT:  Mild clinical pallor, no jaundice, no conjunctival injection or discharge. Hydration status is fair. NECK:  Supple, JVP not seen, no carotid bruits, no palpable lymphadenopathy, no palpable goiter. CHEST:  Clinically clear to auscultation, no wheezes, no crackles. HEART:  Sounds 1 and 2 heard, normal, regular, no murmurs. ABDOMEN:  Moderately obese, soft, non-tender, no palpable organomegaly, no palpable masses, normal bowel sounds. GENITALIA:  Not examined. LOWER EXTREMITIES:  Right LE,  no pitting edema, palpable peripheral pulses. Left LE, under dressings, not formally examined, as patient is just back from OR. MUSCULOSKELETAL SYSTEM:  Generalized osteoarthritic changes, otherwise, as above. CENTRAL NERVOUS SYSTEM:  No focal neurologic deficit on gross examination.  Lab Results:  Basename 01/17/12 0510 01/16/12 0500  WBC 7.6 6.0  HGB 8.6* 8.1*  HCT 25.9* 24.3*  PLT 213 220    Basename 01/17/12 0510 01/16/12 0500  NA 137 139  K 4.1 3.9  CL 101 103  CO2 26 27  GLUCOSE 208* 138*  BUN 9 15  CREATININE 0.76 0.93  CALCIUM 8.6 8.5   Recent Results (from the past 240 hour(s))  SURGICAL PCR SCREEN     Status: Normal   Collection Time   01/16/12  6:32 PM      Component Value Range Status Comment   MRSA, PCR NEGATIVE  NEGATIVE  Final    Staphylococcus aureus NEGATIVE  NEGATIVE  Final      Studies/Results: Dg Tibia/fibula Left  01/15/2012  *RADIOLOGY REPORT*  Clinical Data: Fall with left ankle pain.  LEFT TIBIA AND FIBULA - 2 VIEW  Comparison: None.  Findings: There is a comminuted fracture of the distal fibular shaft, with apex medial angulation of the fracture fragments. Displaced medial malleolar fracture is seen as well.  There is disruption of the ankle mortise, with dorsal displacement of the talus and foot.  IMPRESSION: Ankle fracture dislocation involving the medial malleolus and distal fibular shaft.  Original Report Authenticated By: Reyes Ivan, M.D.   Ct Head Wo Contrast  01/15/2012  *RADIOLOGY REPORT*  Clinical Data:  Fall.  CT HEAD WITHOUT CONTRAST CT CERVICAL SPINE WITHOUT CONTRAST  Technique:  Multidetector CT imaging of the head and cervical spine was performed following the standard protocol without intravenous contrast.  Multiplanar CT image reconstructions of the cervical spine were also generated.  Comparison:  None.  CT HEAD  Findings: No acute intracranial abnormality.  Specifically, no hemorrhage, hydrocephalus, mass lesion, acute infarction, or significant intracranial injury.  No acute calvarial abnormality. Low density areas in the basal ganglia bilaterally, likely old lacunar infarcts.  Air-fluid level in the right maxillary sinus and adjacent ethmoid air cells.  Mastoids are clear.  IMPRESSION: No acute intracranial abnormality.  Acute right paranasal sinusitis.  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  Prevertebral soft  tissues are normal.  There is mild levoscoliosis.  No fracture visualized.  No epidural or paraspinal hematoma.  IMPRESSION: Severe degenerative changes.  No acute findings.  Original Report Authenticated By: Cyndie Chime, M.D.   Ct Cervical Spine Wo Contrast  01/15/2012  *RADIOLOGY REPORT*  Clinical Data:  Fall.   CT HEAD WITHOUT CONTRAST CT CERVICAL SPINE WITHOUT CONTRAST  Technique:  Multidetector CT imaging of the head and cervical spine was performed following the standard protocol without intravenous contrast.  Multiplanar CT image reconstructions of the cervical spine were also generated.  Comparison:  None.  CT HEAD  Findings: No acute intracranial abnormality.  Specifically, no hemorrhage, hydrocephalus, mass lesion, acute infarction, or significant intracranial injury.  No acute calvarial abnormality. Low density areas in the basal ganglia bilaterally, likely old lacunar infarcts.  Air-fluid level in the right maxillary sinus and adjacent ethmoid air cells.  Mastoids are clear.  IMPRESSION: No acute intracranial abnormality.  Acute right paranasal sinusitis.  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  Prevertebral soft tissues are normal.  There is mild levoscoliosis.  No fracture visualized.  No epidural or paraspinal hematoma.  IMPRESSION: Severe degenerative changes.  No acute findings.  Original Report Authenticated By: Cyndie Chime, M.D.   Ct Ankle Left Wo Contrast  01/15/2012  *RADIOLOGY REPORT*  Clinical Data: Preoperative evaluation in patient with left ankle fracture dislocation.  CT OF THE LEFT ANKLE WITHOUT CONTRAST  Technique:  Multidetector CT imaging was performed according to the standard protocol. Multiplanar CT image reconstructions were also generated.  Comparison: Left ankle x-rays obtained earlier same date.  Findings: Comminuted, multipart fracture involving the distal fibula, approximately 6-7 cm above the tip of the lateral malleolus.  Comminuted transverse fracture of the medial malleolus, with slight medial displacement of the distal fragment.  Obliquely oriented fracture involving the lateral aspect of the distal tibia. Vertically oriented comminuted fracture involving the posterior malleolus with slight  displacement.  Ankle mortise intact with narrowing of the joint space.  No fractures involving the talus or calcaneus.  Tiny plantar calcaneal spur.  Small spur/and spleen. Spur/enthesopathy at the insertion of the Achilles tendon on the posterior calcaneus.  Focal enlargement and associated calcification involving what I believe is the tibialis posterior tendon in the hindfoot.  Large ankle joint effusion/hemarthrosis.  IMPRESSION:  1.  Comminuted multipart fracture involving the distal fibula approximately 6-7 cm above the tip of the lateral malleolus. 2.  Fractures involving the medial and posterior malleoli of the distal tibia. 3.  Fracture involving the lateral aspect of the distal tibia. 4.  Possible chronic tendonitis/bursitis involving what I believe is the tibialis posterior tendon (focal enlargement and associated calcification). 5.  Intact ankle mortise with narrowed joint space.  Original Report Authenticated By: Arnell Sieving, M.D.   Dg Foot 2 Views Left  01/15/2012  *RADIOLOGY REPORT*  Clinical Data: Fall with ankle pain and swelling.  LEFT FOOT - 2 VIEW  Comparison: None.  Findings: There is disruption of the ankle mortise, with dorsal displacement of the talus and foot.  Medial malleolar fracture is displaced.  A comminuted distal fibular shaft fracture is partially imaged.  Osseous structures may be mildly osteopenic.  There is a fracture involving the neck of the fifth proximal phalanx. Calcaneal spurs.  IMPRESSION:  1.  Ankle fracture dislocation with fractures involving the medial malleolus and distal fibular shaft. 2.  Fifth proximal phalangeal fracture.  Original  Report Authenticated By: Reyes Ivan, M.D.    Medications: Scheduled Meds:    . amLODipine  5 mg Oral Daily  . amoxicillin  500 mg Oral TID  .  ceFAZolin (ANCEF) IV  2 g Intravenous 60 min Pre-Op  . docusate sodium  100 mg Oral BID  . DULoxetine  30 mg Oral Daily  . pseudoephedrine  60 mg Oral BID   And  .  guaiFENesin  600 mg Oral BID  . HYDROcodone-acetaminophen  1 tablet Oral Q6H  . insulin aspart  0-15 Units Subcutaneous Q4H  . insulin aspart  0-5 Units Subcutaneous QHS  . insulin glargine  25 Units Subcutaneous QHS  . lisinopril  20 mg Oral Daily  . pantoprazole  40 mg Oral Q1200  . senna  1 tablet Oral BID   Continuous Infusions:    . sodium chloride 75 mL/hr at 01/16/12 1913  . DISCONTD: sodium chloride     PRN Meds:.acetaminophen, acetaminophen, alum & mag hydroxide-simeth, HYDROmorphone (DILAUDID) injection, metoCLOPramide (REGLAN) injection, metoCLOPramide, morphine injection, ondansetron (ZOFRAN) IV, ondansetron (ZOFRAN) IV, ondansetron, ondansetron, DISCONTD: 0.9 % irrigation (POUR BTL), DISCONTD:  HYDROmorphone (DILAUDID) injection, DISCONTD: ondansetron (ZOFRAN) IV  Assessment/Plan:  Principal Problem:  *Tibia/fibula fracture: This is a complex fracture, secondary to a mechanical fall. Dr Malon Kindle provided orthopedic consultation, and patient underwent ORIF on 01/17/12. Analgesia seems adequate at this time, and immediate post operative condition is satisfactory. Active Problems:  1. Diabetes mellitus: CBGs appear reasonably controlled at this time. We are managing with diet, SSI and Lantus. HBA1C is pending.  2. Hypertension: This is controlled on pre-admission antihypertensives.  3. Depression: Mood is stable.  4. GERD (gastroesophageal reflux disease): Asymptomatic on PPI.  5. Rheumatoid arthritis: Stable/Quiescent.  6. H/O: CVA (cardiovascular accident)/Bell's palsy: Minimal deficit.   7. Recent Oral/Sinus Surgery: This was about one week ago. Patient is on Amoxicillin, Mucinex, which we shall continue.   8. Anemia: This appears to be due to acute blood loss from fracture, on anemia of chronic disease. CBC appears stable. We shall continue to monitor..  Disposition: Per ortho.   LOS: 2 days   Dalyce Renne,CHRISTOPHER 01/17/2012, 3:38 PM

## 2012-01-17 NOTE — Anesthesia Postprocedure Evaluation (Signed)
  Anesthesia Post-op Note  Patient: Tracey Mason  Procedure(s) Performed: Procedure(s) (LRB): OPEN REDUCTION INTERNAL FIXATION (ORIF) TIBIA/FIBULA FRACTURE (Left)  Patient Location: PACU  Anesthesia Type: GA combined with regional for post-op pain  Level of Consciousness: sedated  Airway and Oxygen Therapy: Patient Spontanous Breathing and Patient connected to nasal cannula oxygen  Post-op Pain: none  Post-op Assessment: Post-op Vital signs reviewed  Post-op Vital Signs: Reviewed  Complications: No apparent anesthesia complications

## 2012-01-17 NOTE — Transfer of Care (Signed)
Immediate Anesthesia Transfer of Care Note  Patient: Tracey Mason  Procedure(s) Performed: Procedure(s) (LRB): OPEN REDUCTION INTERNAL FIXATION (ORIF) TIBIA/FIBULA FRACTURE (Left)  Patient Location: PACU  Anesthesia Type: General and Regional  Level of Consciousness: sedated and patient cooperative  Airway & Oxygen Therapy: Patient Spontanous Breathing and Patient connected to nasal cannula oxygen  Post-op Assessment: Report given to PACU RN, Post -op Vital signs reviewed and stable and Patient moving all extremities X 4  Post vital signs: Reviewed and stable  Complications: No apparent anesthesia complications and Patient re-intubated

## 2012-01-17 NOTE — Interval H&P Note (Signed)
History and Physical Interval Note:  01/17/2012 10:25 AM  Tracey Mason  has presented today for surgery, with the diagnosis of LEFT TIBIAL PILON AND FIBULA FRACTURE  The various methods of treatment have been discussed with the patient and family. After consideration of risks, benefits and other options for treatment, the patient has consented to  Procedure(s) (LRB): OPEN REDUCTION INTERNAL FIXATION (ORIF) TIBIA/FIBULA FRACTURE (Left) as a surgical intervention .  The patients' history has been reviewed, patient examined, no change in status, stable for surgery.  I have reviewed the patients' chart and labs.  Questions were answered to the patient's satisfaction.     Toni Arthurs

## 2012-01-17 NOTE — Brief Op Note (Signed)
01/15/2012 - 01/17/2012  1:04 PM  PATIENT:  Tracey Mason  66 y.o. female  PRE-OPERATIVE DIAGNOSIS:   1.  Left tibial pilon and fibula fracture 2.  Left syndesmosis disruption   POST-OPERATIVE DIAGNOSIS:   1.  Left tibial pilon and fibula fracture 2.  Left syndesmosis disruption   Procedure(s): 1.  ORIF Left tibial pilon and fibular shaft fractures 2.  ORIF Left ankle syndesmosis 3.  Fluoro > 1 hour  SURGEON:  Toni Arthurs, MD  ASSISTANT: n/a  ANESTHESIA:   General, regional  EBL:  minimal   TOURNIQUET:   Total Tourniquet Time Documented: Thigh (Left) - 89 minutes  COMPLICATIONS:  None apparent  DISPOSITION:  Extubated, awake and stable to recovery.  DICTATION ID:  [id number]

## 2012-01-17 NOTE — Anesthesia Procedure Notes (Signed)
Anesthesia Regional Block:  Popliteal block  Pre-Anesthetic Checklist: ,, timeout performed, Correct Patient, Correct Site, Correct Laterality, Correct Procedure, Correct Position, site marked, Risks and benefits discussed,  Surgical consent,  Pre-op evaluation,  At surgeon's request and post-op pain management  Laterality: Left and Lower  Prep: chloraprep       Needles:  Injection technique: Single-shot  Needle Type: Echogenic Needle     Needle Length: 9cm  Needle Gauge: 22 and 22 G    Additional Needles:  Procedures: ultrasound guided Popliteal block Narrative:  Start time: 01/17/2012 10:30 AM End time: 01/17/2012 10:40 AM  Performed by: Personally  Anesthesiologist: Sheldon Silvan, MD  Additional Notes: Marcaine 0.5% with Epi 1:200000  Unable to obtain picture of [place] image due to difficult positioning of patient

## 2012-01-17 NOTE — Op Note (Signed)
NAME:  Tracey Mason, Tracey Mason NO.:  [id number]  MEDICAL RECORD NO.:  [id number]  LOCATION:  5016                         FACILITY:  [facility]  PHYSICIAN:  Toni Arthurs, MD        DATE OF BIRTH:  1946-05-02  DATE OF PROCEDURE:  01/17/2012 DATE OF DISCHARGE:                              OPERATIVE REPORT   PREOPERATIVE DIAGNOSES: 1. Left tibial pilon and fibula fractures. 2. Left syndesmosis disruption.  POSTOPERATIVE DIAGNOSES: 1. Left tibial pilon and fibula fractures. 2. Left syndesmosis disruption.  PROCEDURE: 1. Open reduction and internal fixation of left tibial pilon and     fibular shaft fractures. 2. Open reduction and internal fixation of left ankle syndesmosis. 3. Intraoperative interpretation of fluoroscopic imaging greater than     1 hour.  SURGEON:  Toni Arthurs, MD  ANESTHESIA:  General, regional.  ESTIMATED BLOOD LOSS:  Minimal.  TOURNIQUET TIME:  89 minutes at 225 mmHg.  COMPLICATIONS:  None apparent.  DISPOSITION:  Extubated, awake and stable to recovery.  INDICATIONS FOR PROCEDURE:  The patient is a 66 year old female who fell down some stairs 2 days ago.  She underwent provisional closed reduction of her left ankle fracture dislocation.  She was splinted and underwent postreduction CT scan.  This revealed a tibial pilon fracture as well as a fibular shaft fracture.  Initial injury films showed disruption of the syndesmosis.  She presents now for operative treatment of these injuries.  Her ankle swelling has resolved sufficiently for her skin to wrinkle.  She understands the risks and benefits and the alternative treatment options and elects surgical treatment.  She specifically understands the risks of bleeding, infection, nerve damage, blood clots, need for additional surgery, amputation, and death.  PROCEDURE IN DETAIL:  After preoperative consent was obtained and the correct operative site was identified, the patient was brought to  the operating room and placed supine on the operating table.  General anesthesia was induced.  Preoperative antibiotics were administered. Surgical time-out was taken.  The left lower extremity was prepped and draped in standard sterile fashion with tourniquet around the thigh.  A longitudinal incision was made in the lateral aspect of the ankle.  The interval between the peroneals and the FHL was developed and the posterolateral corner of the tibial pilon was reduced.  It was pinned into position.  AP and lateral views showed appropriate reduction of this fracture line.  A 4-mm partially threaded cannulated screw was then inserted over the guidewire reducing the fracture and holding it in the reduced position.  At this point, attention was turned to the fibular shaft fracture.  Care was taken to preserve the soft tissue envelope over the comminuted segmental portion of the fracture.  A combination of locking small frag plate was selected and placed over the fracture site. An AP fluoroscopic image showed appropriate length of the plate.  The fracture was reduced and the plate was provisionally pinned proximally and distally.  AP and lateral views showed appropriate position of the plate and appropriate reduction of the fracture.  The plate was then secured proximally with 3 bicortical screws and distally with 3 unicortical screws at the end of the fibula.  Attention was then turned to the medial aspect of the ankle where longitudinal incision was made over the medial malleolus.  A drill hole was placed in the metaphyseal bone.  The fracture was reduced and held with a tenaculum.  AP and lateral views showed appropriate reduction of the fracture.  Two guidewires were then placed through the tip of the medial malleolus and then to the metaphyseal bone of the distal tibia. The fracture was then fixed with 2 partially threaded 4-mm cannulated screws.  Guidepins were removed.  Attention was  then returned to the lateral aspect of the ankle.  The syndesmosis was reduced and clamped with a large tenaculum.  Two holes in the plate above the distal fibular fixation were then drilled, and 2 syndesmosis screws were placed across 4 cortices.  Final AP, mortise, and lateral views showed appropriate reduction of the fractures and appropriate position and length of all hardware.  The wounds were irrigated copiously.  The medial incision was closed with inverted simple sutures of 3-0 Monocryl at the level of the subcutaneous tissue and a running 3-0 Prolene at the level of the skin. The lateral incision was closed with 2-0 Vicryl repairing the periosteum and subcutaneous tissue over the plate, and then 3-0 Monocryl at the level of the subcutaneous tissue, and a running 3-0 Prolene to close the skin incision.  Sterile dressings were applied followed by a compression wrap.  The tourniquet was released at 1 hour and 29 minutes.  A well- padded short-leg splint was then placed with the ankle in neutral position.  The patient was then awakened from anesthesia and transported to the recovery room in stable condition.  FOLLOWUP PLAN:  The patient will be nonweightbearing on the left lower extremity.  She will follow up with me in approximately 2 weeks in clinic.  She will likely need placement in a skilled nursing facility since she lives alone and will be nonweightbearing for at least 6 weeks.     Toni Arthurs, MD     JH/MEDQ  D:  01/17/2012  T:  01/17/2012  Job:  [id number]

## 2012-01-18 DIAGNOSIS — I10 Essential (primary) hypertension: Secondary | ICD-10-CM

## 2012-01-18 DIAGNOSIS — S82409A Unspecified fracture of shaft of unspecified fibula, initial encounter for closed fracture: Secondary | ICD-10-CM

## 2012-01-18 DIAGNOSIS — S82209A Unspecified fracture of shaft of unspecified tibia, initial encounter for closed fracture: Secondary | ICD-10-CM

## 2012-01-18 DIAGNOSIS — M069 Rheumatoid arthritis, unspecified: Secondary | ICD-10-CM

## 2012-01-18 DIAGNOSIS — E1165 Type 2 diabetes mellitus with hyperglycemia: Secondary | ICD-10-CM

## 2012-01-18 LAB — GLUCOSE, CAPILLARY
Glucose-Capillary: 126 mg/dL — ABNORMAL HIGH (ref 70–99)
Glucose-Capillary: 135 mg/dL — ABNORMAL HIGH (ref 70–99)
Glucose-Capillary: 218 mg/dL — ABNORMAL HIGH (ref 70–99)
Glucose-Capillary: 95 mg/dL (ref 70–99)

## 2012-01-18 LAB — CBC
HCT: 24.9 % — ABNORMAL LOW (ref 36.0–46.0)
MCHC: 33.7 g/dL (ref 30.0–36.0)
MCV: 92.6 fL (ref 78.0–100.0)
Platelets: 213 10*3/uL (ref 150–400)
WBC: 8.2 10*3/uL (ref 4.0–10.5)

## 2012-01-18 LAB — BASIC METABOLIC PANEL
GFR calc non Af Amer: 87 mL/min — ABNORMAL LOW (ref >90)
Glucose, Bld: 141 mg/dL — ABNORMAL HIGH (ref 70–99)
Potassium: 4 mEq/L (ref 3.5–5.1)
Sodium: 138 mEq/L (ref 135–145)

## 2012-01-18 MED ORDER — OXYCODONE HCL 5 MG PO TABS
5.0000 mg | ORAL_TABLET | ORAL | Status: DC | PRN
  Administered 2012-01-19: 5 mg via ORAL
  Filled 2012-01-18: qty 1

## 2012-01-18 MED ORDER — POLYETHYLENE GLYCOL 3350 17 G PO PACK
17.0000 g | PACK | Freq: Every day | ORAL | Status: DC
  Administered 2012-01-19: 17 g via ORAL
  Filled 2012-01-18 (×2): qty 1

## 2012-01-18 MED ORDER — SODIUM CHLORIDE 0.9 % IJ SOLN
10.0000 mL | Freq: Two times a day (BID) | INTRAMUSCULAR | Status: DC
  Administered 2012-01-18: 3 mL via INTRAVENOUS

## 2012-01-18 NOTE — Progress Notes (Signed)
Subjective: Slept well . No pain issues. No bowel movement in 2 days.  Objective: Vital signs in last 24 hours: Temp:  [97.6 F (36.4 C)-99.5 F (37.5 C)] 99.5 F (37.5 C) (05/26 0700) Pulse Rate:  [99-112] 108  (05/26 0700) Resp:  [12-18] 18  (05/26 0700) BP: (115-146)/(52-73) 122/71 mmHg (05/26 0700) SpO2:  [91 %-99 %] 95 % (05/26 0700) FiO2 (%):  [2 %] 2 % (05/25 1500) Weight change:  Last BM Date: 01/14/12  Intake/Output from previous day: 05/25 0701 - 05/26 0700 In: 4200 [P.O.:1200; I.V.:3000] Out: 5920 [Urine:5900; Blood:20]     Physical Exam: General: Not in acute distress, alert, communicative, fully oriented, not short of breath at rest.  HEENT:  Mild clinical pallor, no jaundice, no conjunctival injection or discharge. Hydration status is satisfactory. NECK:  Supple, JVP not seen, no carotid bruits, no palpable lymphadenopathy, no palpable goiter. CHEST:  Clinically clear to auscultation, no wheezes, no crackles. HEART:  Sounds 1 and 2 heard, normal, regular, no murmurs. ABDOMEN:  Moderately obese, soft, non-tender, no palpable organomegaly, no palpable masses, normal bowel sounds. GENITALIA:  Not examined. LOWER EXTREMITIES:  Right LE,  no pitting edema, palpable peripheral pulses. Left LE, under dressings, not formally examined, as patient is just back from OR. MUSCULOSKELETAL SYSTEM:  Generalized osteoarthritic changes, otherwise, as above. CENTRAL NERVOUS SYSTEM:  No focal neurologic deficit on gross examination.  Lab Results:  Basename 01/18/12 0720 01/17/12 0510  WBC 8.2 7.6  HGB 8.4* 8.6*  HCT 24.9* 25.9*  PLT 213 213    Basename 01/18/12 0720 01/17/12 0510  NA 138 137  K 4.0 4.1  CL 100 101  CO2 29 26  GLUCOSE 141* 208*  BUN 5* 9  CREATININE 0.72 0.76  CALCIUM 8.9 8.6   Recent Results (from the past 240 hour(s))  SURGICAL PCR SCREEN     Status: Normal   Collection Time   01/16/12  6:32 PM      Component Value Range Status Comment   MRSA,  PCR NEGATIVE  NEGATIVE  Final    Staphylococcus aureus NEGATIVE  NEGATIVE  Final      Studies/Results: Dg Ankle 2 Views Left  01/17/2012  *RADIOLOGY REPORT*  Clinical Data: Postop ORIF left ankle  LEFT ANKLE - 2 VIEW  Comparison: ORIF left ankle 01/17/2012 and left lower extremity radiographs and CT left ankle 01/15/2012  Findings: Splint material surrounding the left lower extremity obscures bony detail.  There are postoperative changes of ORIF for a trimalleolar fracture.  Lateral cortical plate and screw fixation of a fibula fracture, with anatomic alignment.  Two screws traverse the medial malleolus, in near anatomic alignment, and there is a screw directed in the anterior to posterior direction involving the lateral aspect of the distal tibia.  Slight anterior subluxation of the talus relative to the distal tibia is unchanged compared to intraoperative radiographs 01/17/2012.  IMPRESSION: Open reduction internal fixation of trimalleolar fractures.  No hardware complication.  Slight anterior subluxation of the talus with respect to the distal tibia.  Original Report Authenticated By: Britta Mccreedy, M.D.   Dg Ankle Complete Left  01/17/2012  *RADIOLOGY REPORT*  Clinical Data: Left ankle fractures.  LEFT ANKLE COMPLETE - 3+ VIEW  Comparison: Radiographs and CT scan dated 01/15/2012  Findings: AP and lateral and oblique C-arm images demonstrate that the patient has undergone open reduction and internal fixation of the trimalleolar fracture of the left ankle.  There is essentially anatomic alignment and position of the fracture  fragments.  On the lateral view the talus is slightly subluxed anteriorly with respect to the distal tibia.  IMPRESSION: Open reduction internal fixation of trimalleolar fractures described.  Slight anterior subluxation of the talus with respect to the distal tibia on the lateral view.  Original Report Authenticated By: Gwynn Burly, M.D.   Dg C-arm 61-120 Min-no  Report  01/17/2012  CLINICAL DATA: orif left ankle fracture   C-ARM 61-120 MINUTES  Fluoroscopy was utilized by the requesting physician.  No radiographic  interpretation.      Medications: Scheduled Meds:    . amLODipine  5 mg Oral Daily  . amoxicillin  500 mg Oral TID  .  ceFAZolin (ANCEF) IV  2 g Intravenous 60 min Pre-Op  . docusate sodium  100 mg Oral BID  . DULoxetine  30 mg Oral Daily  . pseudoephedrine  60 mg Oral BID   And  . guaiFENesin  600 mg Oral BID  . HYDROcodone-acetaminophen  1 tablet Oral Q6H  . insulin aspart  0-15 Units Subcutaneous Q4H  . insulin aspart  0-5 Units Subcutaneous QHS  . insulin glargine  25 Units Subcutaneous QHS  . lisinopril  20 mg Oral Daily  . pantoprazole  40 mg Oral Q1200  . senna  1 tablet Oral BID   Continuous Infusions:    . sodium chloride 75 mL/hr at 01/16/12 1913  . DISCONTD: sodium chloride     PRN Meds:.acetaminophen, acetaminophen, alum & mag hydroxide-simeth, HYDROmorphone (DILAUDID) injection, metoCLOPramide (REGLAN) injection, metoCLOPramide, morphine injection, ondansetron (ZOFRAN) IV, ondansetron (ZOFRAN) IV, ondansetron, ondansetron, DISCONTD: 0.9 % irrigation (POUR BTL), DISCONTD:  HYDROmorphone (DILAUDID) injection, DISCONTD: ondansetron (ZOFRAN) IV  Assessment/Plan:  Principal Problem:  *Tibia/fibula fracture: This was a complex fracture, secondary to a mechanical fall. Dr Malon Kindle provided orthopedic consultation, and Dr Toni Arthurs performed ORIF on 01/17/12. Patient is comfortable, pain-wise. Now POD# 1. Manage per ortho. Active Problems:  1. Diabetes mellitus: CBGs appear controlled at this time. We are managing with diet, SSI and Lantus. HBA1C is pending.  2. Hypertension: This is controlled on pre-admission antihypertensives.  3. Depression: Mood is stable.  4. GERD (gastroesophageal reflux disease): Asymptomatic on PPI.  5. Rheumatoid arthritis: Stable/Quiescent.  6. H/O: CVA (cardiovascular  accident)/Bell's palsy: Minimal deficit.   7. Recent Oral/Sinus Surgery: This was about one week ago. Patient is on Amoxicillin, started 01/06/12. This will be discontinued on 01/19/12. Mucinex continues.   8. Anemia: This appears to be due to acute blood loss from fracture, on anemia of chronic disease. CBC is stable at 8.4-8.6. We shall continue to monitor.  Disposition: Per ortho. For SNF after weekend.   LOS: 3 days   Cherene Dobbins,CHRISTOPHER 01/18/2012, 9:00 AM

## 2012-01-18 NOTE — Progress Notes (Signed)
Subjective: 1 Day Post-Op Procedure(s) (LRB): OPEN REDUCTION INTERNAL FIXATION (ORIF) TIBIA/FIBULA FRACTURE (Left) Patient reports pain as mild.    Objective: Vital signs in last 24 hours: Temp:  [97.6 F (36.4 C)-99.5 F (37.5 C)] 99.5 F (37.5 C) (05/26 0700) Pulse Rate:  [99-112] 108  (05/26 0700) Resp:  [12-18] 18  (05/26 0700) BP: (115-146)/(52-73) 122/71 mmHg (05/26 0700) SpO2:  [91 %-99 %] 95 % (05/26 0700) FiO2 (%):  [2 %] 2 % (05/25 1500)  Intake/Output from previous day: 05/25 0701 - 05/26 0700 In: 3480 [P.O.:480; I.V.:3000] Out: 4420 [Urine:4400; Blood:20] Intake/Output this shift:     Basename 01/17/12 0510 01/16/12 0500 01/15/12 1852  HGB 8.6* 8.1* 9.6*    Basename 01/17/12 0510 01/16/12 0500  WBC 7.6 6.0  RBC 2.80* 2.60*  HCT 25.9* 24.3*  PLT 213 220    Basename 01/17/12 0510 01/16/12 0500  NA 137 139  K 4.1 3.9  CL 101 103  CO2 26 27  BUN 9 15  CREATININE 0.76 0.93  GLUCOSE 208* 138*  CALCIUM 8.6 8.5    Basename 01/15/12 2235  LABPT --  INR 1.03    splint intact.  wiggles toes.  brisk cap refill at toes.  Feels LT in toes.  Assessment/Plan: 1 Day Post-Op Procedure(s) (LRB): OPEN REDUCTION INTERNAL FIXATION (ORIF) TIBIA/FIBULA FRACTURE (Left) Up with therapy NWB on L LE.  Keep foot elevated toes above the nose when in bed. SCDs and aspirin are sufficient for DVT prophylaxis for this injury. Pt will likely need SNF placement since she lives alone and will be NWB for the next 6-8 weeks.  PT and OT recs pending.  Toni Arthurs 01/18/2012, 7:21 AM

## 2012-01-18 NOTE — Progress Notes (Addendum)
Clinical Social Work Department CLINICAL SOCIAL WORK PLACEMENT NOTE 01/18/2012  Patient:  Tracey Mason, Tracey Mason  Account Number:  [id number] Admit date:  01/15/2012  Clinical Social Worker:  Skip Mayer  Date/time:  01/18/2012 04:30 PM  Clinical Social Work is seeking post-discharge placement for this patient at the following level of care:   SKILLED NURSING   (*CSW will update this form in Epic as items are completed)   01/18/2012  Patient/family provided with [facility] Department of Clinical Social Work's list of facilities offering this level of care within the geographic area requested by the patient (or if unable, by the patient's family).  01/18/2012  Patient/family informed of their freedom to choose among providers that offer the needed level of care, that participate in Medicare, Medicaid or managed care program needed by the patient, have an available bed and are willing to accept the patient.  01/18/2012  Patient/family informed of [facility]' ownership interest in [facility], as well as of the fact that they are under no obligation to receive care at this facility.  PASARR submitted to EDS on 01/18/2012 PASARR number received from EDS on 01/20/2012  FL2 transmitted to all facilities in geographic area requested by pt/family on  01/18/2012 FL2 transmitted to all facilities within larger geographic area on NA  Patient informed that his/her managed care company has contracts with or will negotiate with  certain facilities, including the following:     Patient/family informed of bed offers received:  01/20/2012 Patient chooses bed at [facility] Physician recommends and patient chooses bed at  [facility]  Patient to be transferred to [facility]  On 01/20/2012  Patient to be transferred to facility by  [facility]  The following physician request were entered in Epic:   Additional Comments: Patient is very pleased with d/c plan. Notified SNF and Nursing of d/c.   Patient stated that she would contact her family about final d/c.       Lorri Frederick. Jaci Lazier, MSW  [phone]    Dellie Burns, MSW, LCSWA [phone] (weekend)

## 2012-01-18 NOTE — Progress Notes (Signed)
Clinical Social Work Department BRIEF PSYCHOSOCIAL ASSESSMENT 01/18/2012  Patient:  EDITHA, BRIDGEFORTH     Account Number:  [id number]     Admit date:  01/15/2012  Clinical Social Worker:  Skip Mayer  Date/Time:  01/18/2012 04:30 PM  Referred by:  Physician  Date Referred:  01/18/2012 Referred for  SNF Placement   Other Referral:   Interview type:  Patient Other interview type:    PSYCHOSOCIAL DATA Living Status:  ALONE Admitted from facility:   Level of care:   Primary support name:  [name] Primary support relationship to patient:  CHILD, ADULT Degree of support available:   Adequate, per pt    CURRENT CONCERNS Current Concerns  Post-Acute Placement   Other Concerns:    SOCIAL WORK ASSESSMENT / PLAN CSW met with pt re: PT recommendation for SNF. CSW explained placement process with [facility] and answered questions. Pt requesting [facility] as it is close to her home/family. CSW completed FL2 and initiated SNF search. Weekday CSW to f/u with offers.   Assessment/plan status:  Information/Referral to [facility] Other assessment/ plan:   Information/referral to community resources:   SNF    PATIENT'S/FAMILY'S RESPONSE TO PLAN OF CARE: Pt reports " I don't want to go to rehab, but I will if I have to." Pt verbalized understanding of placement process, including need for b/u SNF if [facility] unable to offer. Pt reports appreciation for CSW assist.        Dellie Burns, MSW, LCSWA [phone] (weekend)

## 2012-01-18 NOTE — Progress Notes (Signed)
Occupational Therapy Evaluation Patient Details Name: Tracey Mason MRN: [id number] DOB: 07-25-1946 Today's Date: 01/18/2012 Time: 1750-     OT Assessment / Plan / Recommendation Clinical Impression  66 yo s/p fall with L TIB/FIB fracture - ORIF. Pt will benefit from skilled OT services to max indeppendence with ADL and functional mobility for ADL to facilitate D/C to SNF for rehab.    OT Assessment  Patient needs continued OT Services    Follow Up Recommendations  Skilled nursing facility    Barriers to Discharge Inaccessible home environment;Decreased caregiver support    Equipment Recommendations  Defer to next venue    Recommendations for Other Services  none  Frequency  Min 2X/week    Precautions / Restrictions Restrictions LLE Weight Bearing: Non weight bearing   Pertinent Vitals/Pain No c/o    ADL  Eating/Feeding: Simulated;Independent Where Assessed - Eating/Feeding: Edge of bed Grooming: Simulated;Set up Where Assessed - Grooming: Unsupported sitting Upper Body Bathing: Simulated;Set up Where Assessed - Upper Body Bathing: Unsupported sitting Lower Body Bathing: Simulated;Moderate assistance Where Assessed - Lower Body Bathing: Unsupported sitting;Lean right and/or left Upper Body Dressing: Simulated;Set up Where Assessed - Upper Body Dressing: Supported sitting Lower Body Dressing: Simulated;Maximal assistance Where Assessed - Lower Body Dressing: Lean right and/or left;Unsupported sitting Toilet Transfer: Simulated;Maximal assistance Toilet Transfer Method: Sit to stand Toilet Transfer Equipment: Bedside commode Toileting - Clothing Manipulation and Hygiene: Simulated;Moderate assistance Where Assessed - Toileting Clothing Manipulation and Hygiene: Lean right and/or left Equipment Used: Gait belt Transfers/Ambulation Related to ADLs: Max A - lift and lower ADL Comments: difficulty with maintaining WBS.    OT Diagnosis: Generalized weakness;Acute pain    OT Problem List: Decreased strength;Decreased activity tolerance;Decreased range of motion;Impaired balance (sitting and/or standing);Decreased safety awareness;Decreased knowledge of use of DME or AE;Decreased knowledge of precautions;Pain OT Treatment Interventions: Self-care/ADL training;Therapeutic exercise;DME and/or AE instruction;Therapeutic activities;Patient/family education;Balance training   OT Goals Acute Rehab OT Goals OT Goal Formulation: With patient Time For Goal Achievement: 01/25/12 Potential to Achieve Goals: Good ADL Goals Pt Will Perform Lower Body Bathing: with min assist;Supine, head of bed up;Supine, rolling right and/or left ADL Goal: Lower Body Bathing - Progress: Goal set today Pt Will Perform Lower Body Dressing: with min assist;Supine, head of bed up;Supine, rolling right and/or left ADL Goal: Lower Body Dressing - Progress: Goal set today Pt Will Transfer to Toilet: with mod assist;Drop arm 3-in-1;Maintaining weight bearing status ADL Goal: Toilet Transfer - Progress: Goal set today Pt Will Perform Toileting - Clothing Manipulation: Sitting on 3-in-1 or toilet;with min assist;with cueing (comment type and amount);Other (comment) (leaning R/L) ADL Goal: Toileting - Clothing Manipulation - Progress: Goal set today Pt Will Perform Toileting - Hygiene: with modified independence;Sitting on 3-in-1 or toilet ADL Goal: Toileting - Hygiene - Progress: Goal set today Arm Goals Pt Will Complete Theraband Exer: to increase strength;2 sets;Bilateral upper extremities;Level 3 Theraband Arm Goal: Theraband Exercises - Progress: Goal set today  Visit Information  Last OT Received On: 01/18/12    Subjective Data   My right leg hurts - why?   Prior Functioning  Home Living Lives With: Alone Available Help at Discharge: Other (Comment) Type of Home: House Home Access: Level entry Home Layout: Two level Alternate Level Stairs-Number of Steps: 14 Bathroom Shower/Tub:  Tub/shower unit;Curtain Insurance claims handler Accessibility: Yes How Accessible: Accessible via walker Additional Comments: Pt plans to D/C to SNF Prior Function Level of Independence: Independent Able to Take Stairs?: Yes Driving: Yes Vocation:  Retired Musician: No difficulties Dominant Hand: Right    Cognition  Overall Cognitive Status: Appears within functional limits for tasks assessed/performed Arousal/Alertness: Awake/alert Orientation Level: Appears intact for tasks assessed Behavior During Session: [facility] for tasks performed    Extremity/Trunk Assessment Right Upper Extremity Assessment RUE ROM/Strength/Tone: Within functional levels RUE Sensation: WFL - Light Touch RUE Coordination: WFL - gross/fine motor Left Upper Extremity Assessment LUE ROM/Strength/Tone: Within functional levels LUE Sensation: WFL - Light Touch LUE Coordination: WFL - gross/fine motor Trunk Assessment Trunk Assessment: Normal   Mobility Bed Mobility Bed Mobility: Supine to Sit Supine to Sit: 3: Mod assist;HOB flat;With rails Transfers Transfers: Sit to Stand;Stand to Sit Sit to Stand: 2: Max assist Sit to Stand: Patient Percentage: 70% Stand to Sit: 2: Max assist Details for Transfer Assistance: Assist for NWB   Exercise  BUE strengthening with theraband  Balance  Max a  End of Session OT - End of Session Equipment Utilized During Treatment: Gait belt Activity Tolerance: Patient limited by fatigue Patient left: in bed;with call bell/phone within reach   [facility] 01/18/2012, 6:17 PM [facility], OTR/L  [phone] 01/18/2012

## 2012-01-18 NOTE — Progress Notes (Signed)
Physical Therapy Evaluation Patient Details Name: Tracey Mason MRN: [id number] DOB: 10-31-1945 Today's Date: 01/18/2012 Time: 1012-1042 PT Time Calculation (min): 30 min  PT Assessment / Plan / Recommendation Clinical Impression  Pt is a 66 y/o female admitted s/p fall with left tibial ORIF along with the below PT problem list.  Pt would benefit from acute PT to maximize independence and facilitate d/c to SNF.    PT Assessment  Patient needs continued PT services    Follow Up Recommendations  Skilled nursing facility    Barriers to Discharge Decreased caregiver support      lEquipment Recommendations  Defer to next venue    Recommendations for Other Services     Frequency Min 3X/week    Precautions / Restrictions Precautions Precautions: Fall Restrictions Weight Bearing Restrictions: Yes LLE Weight Bearing: Non weight bearing   Pertinent Vitals/Pain N/A      Mobility  Bed Mobility Bed Mobility: Supine to Sit Supine to Sit: 4: Min guard;With rails Details for Bed Mobility Assistance: Guarding for balance with cues for sequence. Transfers Transfers: Sit to Stand;Stand to Sit Sit to Stand: 1: +2 Total assist;With upper extremity assist;From bed Sit to Stand: Patient Percentage: 40% Stand to Sit: 1: +2 Total assist;With upper extremity assist;To chair/3-in-1 Stand to Sit: Patient Percentage: 40% Details for Transfer Assistance: Assist for balance and to attain/maintain NWBing left LE.  Cues for safest hand/left LE placement. Ambulation/Gait Ambulation/Gait Assistance: 1: +2 Total assist Ambulation/Gait: Patient Percentage: 40% Ambulation Distance (Feet): 10 Feet Assistive device: Rolling walker Ambulation/Gait Assistance Details: Assist to attain/maintain NWBing left LE, sequence pt, advance RW, and for balance.  Max cues for sequence as well as to keep left LE NWBing. Gait Pattern: Step-to pattern;Decreased stride length Stairs: No Wheelchair  Mobility Wheelchair Mobility: No    Exercises     PT Diagnosis: Difficulty walking;Acute pain  PT Problem List: Decreased strength;Decreased activity tolerance;Decreased balance;Decreased mobility;Decreased knowledge of use of DME;Decreased knowledge of precautions;Pain PT Treatment Interventions: DME instruction;Gait training;Functional mobility training;Therapeutic activities;Balance training;Patient/family education   PT Goals Acute Rehab PT Goals PT Goal Formulation: With patient Time For Goal Achievement: 02/01/12 Potential to Achieve Goals: Good Pt will go Supine/Side to Sit: with supervision PT Goal: Supine/Side to Sit - Progress: Goal set today Pt will go Sit to Supine/Side: with supervision PT Goal: Sit to Supine/Side - Progress: Goal set today Pt will go Sit to Stand: with min assist PT Goal: Sit to Stand - Progress: Goal set today Pt will go Stand to Sit: with min assist PT Goal: Stand to Sit - Progress: Goal set today Pt will Ambulate: 51 - 150 feet;with min assist;with least restrictive assistive device PT Goal: Ambulate - Progress: Goal set today  Visit Information  Last PT Received On: 01/18/12 Assistance Needed: +2    Subjective Data  Subjective: "I dropped my phone up there, so I had to crawl back up to get it." Patient Stated Goal: Get well.   Prior Functioning  Home Living Lives With: Alone Available Help at Discharge: Other (Comment) (None) Type of Home: House Home Access: Level entry Home Layout: Two level Alternate Level Stairs-Number of Steps: 14 Bathroom Shower/Tub: Tub/shower unit;Curtain Dentist: None Prior Function Level of Independence: Independent Able to Take Stairs?: Yes Driving: Yes Vocation: Retired Musician: No difficulties    Cognition  Overall Cognitive Status: Appears within functional limits for tasks assessed/performed Arousal/Alertness: Awake/alert Orientation  Level: Appears intact for tasks assessed Behavior During Session:  WFL for tasks performed    Extremity/Trunk Assessment Right Upper Extremity Assessment RUE ROM/Strength/Tone: Within functional levels RUE Sensation: WFL - Light Touch RUE Coordination: WFL - gross/fine motor Left Upper Extremity Assessment LUE ROM/Strength/Tone: Within functional levels LUE Sensation: WFL - Light Touch LUE Coordination: WFL - gross/fine motor Right Lower Extremity Assessment RLE ROM/Strength/Tone: Within functional levels RLE Sensation: WFL - Light Touch RLE Coordination: WFL - gross/fine motor Left Lower Extremity Assessment LLE ROM/Strength/Tone: Within functional levels LLE Sensation: WFL - Light Touch LLE Coordination: WFL - gross/fine motor Trunk Assessment Trunk Assessment: Normal   Balance Balance Balance Assessed: No  End of Session PT - End of Session Equipment Utilized During Treatment: Gait belt Activity Tolerance: Patient tolerated treatment well Patient left: in chair;with call bell/phone within reach Nurse Communication: Mobility status   Cephus Shelling 01/18/2012, 11:56 AM  01/18/2012 Cephus Shelling, PT, DPT [phone]

## 2012-01-19 DIAGNOSIS — M069 Rheumatoid arthritis, unspecified: Secondary | ICD-10-CM

## 2012-01-19 DIAGNOSIS — S82209A Unspecified fracture of shaft of unspecified tibia, initial encounter for closed fracture: Secondary | ICD-10-CM

## 2012-01-19 DIAGNOSIS — I10 Essential (primary) hypertension: Secondary | ICD-10-CM

## 2012-01-19 DIAGNOSIS — S82409A Unspecified fracture of shaft of unspecified fibula, initial encounter for closed fracture: Secondary | ICD-10-CM

## 2012-01-19 DIAGNOSIS — E1165 Type 2 diabetes mellitus with hyperglycemia: Secondary | ICD-10-CM

## 2012-01-19 LAB — CBC
HCT: 25.1 % — ABNORMAL LOW (ref 36.0–46.0)
MCH: 30.8 pg (ref 26.0–34.0)
MCHC: 33.5 g/dL (ref 30.0–36.0)
MCV: 91.9 fL (ref 78.0–100.0)
Platelets: 242 10*3/uL (ref 150–400)
RDW: 12.7 % (ref 11.5–15.5)
WBC: 8 10*3/uL (ref 4.0–10.5)

## 2012-01-19 LAB — BASIC METABOLIC PANEL
BUN: 8 mg/dL (ref 6–23)
Calcium: 8.9 mg/dL (ref 8.4–10.5)
Chloride: 101 mEq/L (ref 96–112)
Creatinine, Ser: 0.67 mg/dL (ref 0.50–1.10)
GFR calc Af Amer: >90 mL/min (ref >90)
GFR calc non Af Amer: 89 mL/min — ABNORMAL LOW (ref >90)

## 2012-01-19 LAB — GLUCOSE, CAPILLARY
Glucose-Capillary: 107 mg/dL — ABNORMAL HIGH (ref 70–99)
Glucose-Capillary: 176 mg/dL — ABNORMAL HIGH (ref 70–99)
Glucose-Capillary: 116 mg/dL — ABNORMAL HIGH (ref 70–99)
Glucose-Capillary: 107 mg/dL — ABNORMAL HIGH (ref 70–99)
Glucose-Capillary: 231 mg/dL — ABNORMAL HIGH (ref 70–99)
Glucose-Capillary: 197 mg/dL — ABNORMAL HIGH (ref 70–99)
Glucose-Capillary: 217 mg/dL — ABNORMAL HIGH (ref 70–99)

## 2012-01-19 NOTE — Progress Notes (Addendum)
Clinical Social Work  Aware of placement and needs.  Still awaiting Pass[employer] to be reviewed and authorization given.  Also, awaiting [employer] review in which patient's information and referral has been sent on 5/26.  Due to holiday, both offices are closed.  Called [facility], which is patient's first choice, however no answer and ?holiday coverage.  Will have unit CSW to follow up on 5/28 with above items listed.  Will remain on case and follow for dc planning and new SNF placement.  Ashley Jacobs, MSW LCSW [phone]   Received call back from [facility]. They made a bed offer and patient accepted. Anticipate dc on Tuesday or Wednesday.  Family in room and all agreeable.  Patient will need an EMS for transport.  Will have unit CSW to follow up on 5/28.  [facility]

## 2012-01-19 NOTE — Progress Notes (Signed)
Subjective: 2 Days Post-Op Procedure(s) (LRB): OPEN REDUCTION INTERNAL FIXATION (ORIF) TIBIA/FIBULA FRACTURE (Left) Patient reports pain as mild.  Sitting up visiting with family.   Objective: Vital signs in last 24 hours: Temp:  [97.6 F (36.4 C)-99.6 F (37.6 C)] 97.6 F (36.4 C) (05/27 0611) Pulse Rate:  [85-108] 99  (05/27 0611) Resp:  [18] 18  (05/27 0611) BP: (115-148)/(65-79) 115/70 mmHg (05/27 0611) SpO2:  [92 %-98 %] 92 % (05/27 0611)  Intake/Output from previous day: 05/26 0701 - 05/27 0700 In: 840 [P.O.:840] Out: -  Intake/Output this shift: Total I/O In: 240 [P.O.:240] Out: -    Basename 01/19/12 0512 01/18/12 0720 01/17/12 0510  HGB 8.4* 8.4* 8.6*    Basename 01/19/12 0512 01/18/12 0720  WBC 8.0 8.2  RBC 2.73* 2.69*  HCT 25.1* 24.9*  PLT 242 213    Basename 01/19/12 0512 01/18/12 0720  NA 139 138  K 3.7 4.0  CL 101 100  CO2 27 29  BUN 8 5*  CREATININE 0.67 0.72  GLUCOSE 121* 141*  CALCIUM 8.9 8.9   No results found for this basename: LABPT:2,INR:2 in the last 72 hours  splint intact.  NVI at toes.  Assessment/Plan: 2 Days Post-Op Procedure(s) (LRB): OPEN REDUCTION INTERNAL FIXATION (ORIF) TIBIA/FIBULA FRACTURE (Left) Up with therapy OK for discharge at any time from ortho perspective.  She'll need to f/u with me in 2 weeks.  Continue NWB on L LE.  Pt only needs aspirin for DVT prophylaxis as an outpatient.  Toni Arthurs 01/19/2012, 12:07 PM

## 2012-01-19 NOTE — Progress Notes (Signed)
Physical Therapy Treatment Patient Details Name: Tracey Mason MRN: [id number] DOB: [birth year] Today's Date: 01/19/2012 Time: 5284-1324 PT Time Calculation (min): 23 min  PT Assessment / Plan / Recommendation Comments on Treatment Session  Did not increase ambulation distance today due to pt unable to take hop while maintaining NWBing LLE.      Follow Up Recommendations  Skilled nursing facility    Barriers to Discharge        Equipment Recommendations  Defer to next venue    Recommendations for Other Services    Frequency Min 3X/week   Plan Discharge plan remains appropriate    Precautions / Restrictions Precautions Precautions: Fall Restrictions Weight Bearing Restrictions: Yes LLE Weight Bearing: Non weight bearing       Mobility  Bed Mobility Bed Mobility: Supine to Sit;Sitting - Scoot to Edge of Bed Supine to Sit: 3: Mod assist;HOB flat;With rails Sitting - Scoot to Edge of Bed: 4: Min assist Details for Bed Mobility Assistance: (A) to lift shoulders/trunk to sitting upright; Cues for sequencing & technique.   Transfers Transfers: Sit to Stand;Stand to Sit Sit to Stand: 3: Mod assist;With upper extremity assist;With armrests;From bed;From chair/3-in-1 Stand to Sit: 2: Max assist;With armrests;With upper extremity assist;To chair/3-in-1 Details for Transfer Assistance: Cues for hand placement, LLE positioning, & technique.  (A) to achieve standing, balance, controlled descent, & to position LLE to maintain NWBing.  Ambulation/Gait Ambulation/Gait Assistance: 1: +2 Total assist Ambulation/Gait: Patient Percentage: 30% Ambulation Distance (Feet):  (2 hops forwards) Assistive device: Rolling walker Ambulation/Gait Assistance Details: Did not increase distance due to pt unable to take hop while maintaining NWBing LLE.  max cues for sequencing, & technique.   Gait Pattern: Step-to pattern Stairs: No    Exercises General Exercises - Lower Extremity Long Arc Quad:  Both;5 reps;AROM Straight Leg Raises: AAROM;Both;5 reps Hip Flexion/Marching: AROM;Both;5 reps   PT Diagnosis:    PT Problem List:   PT Treatment Interventions:     PT Goals Acute Rehab PT Goals Time For Goal Achievement: 02/01/12 PT Goal: Supine/Side to Sit - Progress: Progressing toward goal PT Goal: Sit to Stand - Progress: Progressing toward goal PT Goal: Stand to Sit - Progress: Progressing toward goal PT Goal: Ambulate - Progress: Not progressing  Visit Information  Last PT Received On: 01/19/12 Assistance Needed: +2    Subjective Data      Cognition  Overall Cognitive Status: Appears within functional limits for tasks assessed/performed Arousal/Alertness: Awake/alert Orientation Level: Appears intact for tasks assessed Behavior During Session: [facility] for tasks performed    Balance     End of Session PT - End of Session Equipment Utilized During Treatment: Gait belt Activity Tolerance: Patient tolerated treatment well Patient left: in chair;with call bell/phone within reach    Lara Mulch 01/19/2012, 1:22 PM  Verdell Face, PTA [phone] 01/19/2012

## 2012-01-19 NOTE — Progress Notes (Signed)
Subjective: No new issues.  Objective: Vital signs in last 24 hours: Temp:  [97.6 F (36.4 C)-99.6 F (37.6 C)] 97.6 F (36.4 C) (05/27 0611) Pulse Rate:  [85-108] 99  (05/27 0611) Resp:  [18] 18  (05/27 0611) BP: (115-148)/(65-79) 115/70 mmHg (05/27 0611) SpO2:  [92 %-98 %] 92 % (05/27 4540) Weight change:  Last BM Date: 01/14/12  Intake/Output from previous day: 05/26 0701 - 05/27 0700 In: 840 [P.O.:840] Out: -  Total I/O In: 240 [P.O.:240] Out: -    Physical Exam: General: Sitting comfortably in chair, alert, communicative, fully oriented, not short of breath at rest.  HEENT:  Mild clinical pallor, no jaundice, no conjunctival injection or discharge. Hydration status is satisfactory. NECK:  Supple, JVP not seen, no carotid bruits, no palpable lymphadenopathy, no palpable goiter. CHEST:  Clinically clear to auscultation, no wheezes, no crackles. HEART:  Sounds 1 and 2 heard, normal, regular, no murmurs. ABDOMEN:  Moderately obese, soft, non-tender, no palpable organomegaly, no palpable masses, normal bowel sounds. GENITALIA:  Not examined. LOWER EXTREMITIES:  Right LE,  no pitting edema, palpable peripheral pulses. Left LE, under dressings, not formally examined, as patient is just back from OR. MUSCULOSKELETAL SYSTEM:  Generalized osteoarthritic changes, otherwise, as above. CENTRAL NERVOUS SYSTEM:  No focal neurologic deficit on gross examination.  Lab Results:  Basename 01/19/12 0512 01/18/12 0720  WBC 8.0 8.2  HGB 8.4* 8.4*  HCT 25.1* 24.9*  PLT 242 213    Basename 01/19/12 0512 01/18/12 0720  NA 139 138  K 3.7 4.0  CL 101 100  CO2 27 29  GLUCOSE 121* 141*  BUN 8 5*  CREATININE 0.67 0.72  CALCIUM 8.9 8.9   Recent Results (from the past 240 hour(s))  SURGICAL PCR SCREEN     Status: Normal   Collection Time   01/16/12  6:32 PM      Component Value Range Status Comment   MRSA, PCR NEGATIVE  NEGATIVE  Final    Staphylococcus aureus NEGATIVE  NEGATIVE   Final      Studies/Results: Dg Ankle 2 Views Left  01/17/2012  *RADIOLOGY REPORT*  Clinical Data: Postop ORIF left ankle  LEFT ANKLE - 2 VIEW  Comparison: ORIF left ankle 01/17/2012 and left lower extremity radiographs and CT left ankle 01/15/2012  Findings: Splint material surrounding the left lower extremity obscures bony detail.  There are postoperative changes of ORIF for a trimalleolar fracture.  Lateral cortical plate and screw fixation of a fibula fracture, with anatomic alignment.  Two screws traverse the medial malleolus, in near anatomic alignment, and there is a screw directed in the anterior to posterior direction involving the lateral aspect of the distal tibia.  Slight anterior subluxation of the talus relative to the distal tibia is unchanged compared to intraoperative radiographs 01/17/2012.  IMPRESSION: Open reduction internal fixation of trimalleolar fractures.  No hardware complication.  Slight anterior subluxation of the talus with respect to the distal tibia.  Original Report Authenticated By: Britta Mccreedy, M.D.   Dg Ankle Complete Left  01/17/2012  *RADIOLOGY REPORT*  Clinical Data: Left ankle fractures.  LEFT ANKLE COMPLETE - 3+ VIEW  Comparison: Radiographs and CT scan dated 01/15/2012  Findings: AP and lateral and oblique C-arm images demonstrate that the patient has undergone open reduction and internal fixation of the trimalleolar fracture of the left ankle.  There is essentially anatomic alignment and position of the fracture fragments.  On the lateral view the talus is slightly subluxed anteriorly with respect  to the distal tibia.  IMPRESSION: Open reduction internal fixation of trimalleolar fractures described.  Slight anterior subluxation of the talus with respect to the distal tibia on the lateral view.  Original Report Authenticated By: Gwynn Burly, M.D.   Dg C-arm 61-120 Min-no Report  01/17/2012  CLINICAL DATA: orif left ankle fracture   C-ARM 61-120 MINUTES   Fluoroscopy was utilized by the requesting physician.  No radiographic  interpretation.      Medications: Scheduled Meds:    . amLODipine  5 mg Oral Daily  . amoxicillin  500 mg Oral TID  . docusate sodium  100 mg Oral BID  . DULoxetine  30 mg Oral Daily  . pseudoephedrine  60 mg Oral BID   And  . guaiFENesin  600 mg Oral BID  . HYDROcodone-acetaminophen  1 tablet Oral Q6H  . insulin aspart  0-15 Units Subcutaneous Q4H  . insulin aspart  0-5 Units Subcutaneous QHS  . insulin glargine  25 Units Subcutaneous QHS  . lisinopril  20 mg Oral Daily  . pantoprazole  40 mg Oral Q1200  . polyethylene glycol  17 g Oral Daily  . senna  1 tablet Oral BID  . sodium chloride  10 mL Intravenous Q12H   Continuous Infusions:   PRN Meds:.acetaminophen, acetaminophen, alum & mag hydroxide-simeth, metoCLOPramide (REGLAN) injection, metoCLOPramide, morphine injection, ondansetron (ZOFRAN) IV, ondansetron (ZOFRAN) IV, ondansetron, ondansetron, oxyCODONE  Assessment/Plan:  Principal Problem:  *Tibia/fibula fracture: This was a complex fracture, secondary to a mechanical fall. Dr Malon Kindle provided orthopedic consultation, and Dr Toni Arthurs performed ORIF on 01/17/12. Patient is comfortable, pain-wise. Now POD# 2. Manage per ortho. We have now changed analgesics to prn. Active Problems:  1. Diabetes mellitus: CBGs appear controlled at this time. We are managing with diet, SSI and Lantus.  2. Hypertension: This is controlled on pre-admission antihypertensives.  3. Depression: Mood is stable.  4. GERD (gastroesophageal reflux disease): Asymptomatic on PPI.  5. Rheumatoid arthritis: Stable/Quiescent.  6. H/O: CVA (cardiovascular accident)/Bell's palsy: Minimal deficit.   7. Recent Oral/Sinus Surgery: This was about one week ago. Patient is on Amoxicillin, started 01/06/12. This has been discontinued on 01/19/12. Mucinex continues.   8. Anemia: This appears to be due to acute blood loss from fracture,  on anemia of chronic disease. CBC is stable at 8.4-8.6. We shall continue to monitor.  Disposition: Per ortho. For SNF on 01/20/12 or 01/21/12.   LOS: 4 days   Tracey Mason,CHRISTOPHER 01/19/2012, 10:09 AM

## 2012-01-19 NOTE — Progress Notes (Signed)
Called to pt's room by NT who found pt sitting on the floor in front of her BSC. Pt states "I needed to use the bathroom really bad, so I tried to go and slipped and just lowered myself to the floor." Pt had no c/o pain or discomfort. She states that she did not hit her head or any other part of her body. She was assisted back to the [facility]. VS: 98.2, 18, 108, 95% RA, 127/77. Pt is in no distress. She asked that I not call her dtr. Once finished on BSC, assisted back to bed, leg elevated, bed alarm set. Reminded pt to call for assistance at any time. Pt verbalizes understanding and states "I will not do that again." Attending MD, Dr. Brien Few, notified verbally. Will continue to monitor pt.  Tammy Sours

## 2012-01-19 NOTE — Discharge Summary (Signed)
Physician Discharge Summary  Patient ID: Tracey Mason MRN: [id number] DOB/AGE: [birth year] 66 y.o.  Admit date: 01/15/2012 Discharge date: 01/20/2012  Primary Care Physician:  Minda Meo, MD, MD   Discharge Diagnoses:    Patient Active Problem List  Diagnoses  . Tibia/fibula fracture  . Diabetes mellitus  . Hypertension  . Depression  . GERD (gastroesophageal reflux disease)  . Hyperlipidemia  . Diverticulosis  . Rheumatoid arthritis  . Osteoarthritis  . H/O: CVA (cardiovascular accident)  . Bell's palsy    Medication List  As of 01/20/2012 12:22 PM   STOP taking these medications         amoxicillin 500 MG capsule      HYDROcodone-acetaminophen 5-325 MG per tablet         TAKE these medications         acetaminophen 325 MG tablet   Commonly known as: TYLENOL   Take 2 tablets (650 mg total) by mouth every 6 (six) hours as needed (or Fever >/= 101).      amLODipine 5 MG tablet   Commonly known as: NORVASC   Take 5 mg by mouth daily.      aspirin 81 MG tablet   Take 81 mg by mouth daily.      cholecalciferol 1000 UNITS tablet   Commonly known as: VITAMIN D   Take 1,000 Units by mouth daily.      CITRACAL + D PO   Take 1 tablet by mouth 2 (two) times daily.      DSS 100 MG Caps   Take 100 mg by mouth 2 (two) times daily.      DULoxetine 30 MG capsule   Commonly known as: CYMBALTA   Take 30 mg by mouth daily. For depression      insulin glargine 100 UNIT/ML injection   Commonly known as: LANTUS   Inject 25 Units into the skin at bedtime.      insulin glulisine 100 UNIT/ML injection   Commonly known as: APIDRA   Inject 10-30 Units into the skin 3 (three) times daily before meals. Sliding scale      lisinopril 20 MG tablet   Commonly known as: PRINIVIL,ZESTRIL   Take 20 mg by mouth daily.      meloxicam 15 MG tablet   Commonly known as: MOBIC   Take 15 mg by mouth daily.      oxyCODONE 5 MG immediate release tablet   Commonly known as:  Oxy IR/ROXICODONE   Take 1 tablet (5 mg total) by mouth every 4 (four) hours as needed.      pantoprazole 40 MG tablet   Commonly known as: PROTONIX   Take 40 mg by mouth daily.      polyethylene glycol packet   Commonly known as: MIRALAX / GLYCOLAX   Take 17 g by mouth daily.      pseudoephedrine-guaifenesin 60-600 MG per tablet   Commonly known as: MUCINEX D   Take 1 tablet by mouth every 12 (twelve) hours.      simvastatin 40 MG tablet   Commonly known as: ZOCOR   Take 40 mg by mouth daily.      vitamin C 1000 MG tablet   Take 1,000 mg by mouth daily.             Disposition and Follow-up:  Follow up with primary MD, and with DR Toni Arthurs, orthopedic surgeon.  Consults:  orthopedic surgery  Dr Malon Kindle. Dr Toni Arthurs.  Significant Diagnostic Studies:  Dg Tibia/fibula Left  01/15/2012  *RADIOLOGY REPORT*  Clinical Data: Fall with left ankle pain.  LEFT TIBIA AND FIBULA - 2 VIEW  Comparison: None.  Findings: There is a comminuted fracture of the distal fibular shaft, with apex medial angulation of the fracture fragments. Displaced medial malleolar fracture is seen as well.  There is disruption of the ankle mortise, with dorsal displacement of the talus and foot.  IMPRESSION: Ankle fracture dislocation involving the medial malleolus and distal fibular shaft.  Original Report Authenticated By: Reyes Ivan, M.D.   Ct Head Wo Contrast  01/15/2012  *RADIOLOGY REPORT*  Clinical Data:  Fall.  CT HEAD WITHOUT CONTRAST CT CERVICAL SPINE WITHOUT CONTRAST  Technique:  Multidetector CT imaging of the head and cervical spine was performed following the standard protocol without intravenous contrast.  Multiplanar CT image reconstructions of the cervical spine were also generated.  Comparison:  None.  CT HEAD  Findings: No acute intracranial abnormality.  Specifically, no hemorrhage, hydrocephalus, mass lesion, acute infarction, or significant intracranial injury.  No acute  calvarial abnormality. Low density areas in the basal ganglia bilaterally, likely old lacunar infarcts.  Air-fluid level in the right maxillary sinus and adjacent ethmoid air cells.  Mastoids are clear.  IMPRESSION: No acute intracranial abnormality.  Acute right paranasal sinusitis.  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  Prevertebral soft tissues are normal.  There is mild levoscoliosis.  No fracture visualized.  No epidural or paraspinal hematoma.  IMPRESSION: Severe degenerative changes.  No acute findings.  Original Report Authenticated By: Cyndie Chime, M.D.   Ct Cervical Spine Wo Contrast  01/15/2012  *RADIOLOGY REPORT*  Clinical Data:  Fall.  CT HEAD WITHOUT CONTRAST CT CERVICAL SPINE WITHOUT CONTRAST  Technique:  Multidetector CT imaging of the head and cervical spine was performed following the standard protocol without intravenous contrast.  Multiplanar CT image reconstructions of the cervical spine were also generated.  Comparison:  None.  CT HEAD  Findings: No acute intracranial abnormality.  Specifically, no hemorrhage, hydrocephalus, mass lesion, acute infarction, or significant intracranial injury.  No acute calvarial abnormality. Low density areas in the basal ganglia bilaterally, likely old lacunar infarcts.  Air-fluid level in the right maxillary sinus and adjacent ethmoid air cells.  Mastoids are clear.  IMPRESSION: No acute intracranial abnormality.  Acute right paranasal sinusitis.  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  Prevertebral soft tissues are normal.  There is mild levoscoliosis.  No fracture visualized.  No epidural or paraspinal hematoma.  IMPRESSION: Severe degenerative changes.  No acute findings.  Original Report Authenticated By: Cyndie Chime, M.D.   Ct Ankle Left Wo Contrast  01/15/2012  *RADIOLOGY REPORT*  Clinical Data:  Preoperative evaluation in patient with left ankle fracture dislocation.  CT OF THE LEFT ANKLE WITHOUT CONTRAST  Technique:  Multidetector CT imaging was performed according to the standard protocol. Multiplanar CT image reconstructions were also generated.  Comparison: Left ankle x-rays obtained earlier same date.  Findings: Comminuted, multipart fracture involving the distal fibula, approximately 6-7 cm above the tip of the lateral malleolus.  Comminuted transverse fracture of the medial malleolus, with slight medial displacement of the distal fragment.  Obliquely oriented fracture involving the lateral aspect of the distal tibia. Vertically oriented comminuted fracture involving the posterior malleolus with slight displacement.  Ankle mortise intact with narrowing of the joint  space.  No fractures involving the talus or calcaneus.  Tiny plantar calcaneal spur.  Small spur/and spleen. Spur/enthesopathy at the insertion of the Achilles tendon on the posterior calcaneus.  Focal enlargement and associated calcification involving what I believe is the tibialis posterior tendon in the hindfoot.  Large ankle joint effusion/hemarthrosis.  IMPRESSION:  1.  Comminuted multipart fracture involving the distal fibula approximately 6-7 cm above the tip of the lateral malleolus. 2.  Fractures involving the medial and posterior malleoli of the distal tibia. 3.  Fracture involving the lateral aspect of the distal tibia. 4.  Possible chronic tendonitis/bursitis involving what I believe is the tibialis posterior tendon (focal enlargement and associated calcification). 5.  Intact ankle mortise with narrowed joint space.  Original Report Authenticated By: Arnell Sieving, M.D.   Dg Foot 2 Views Left  01/15/2012  *RADIOLOGY REPORT*  Clinical Data: Fall with ankle pain and swelling.  LEFT FOOT - 2 VIEW  Comparison: None.  Findings: There is disruption of the ankle mortise, with dorsal displacement of the talus and foot.  Medial  malleolar fracture is displaced.  A comminuted distal fibular shaft fracture is partially imaged.  Osseous structures may be mildly osteopenic.  There is a fracture involving the neck of the fifth proximal phalanx. Calcaneal spurs.  IMPRESSION:  1.  Ankle fracture dislocation with fractures involving the medial malleolus and distal fibular shaft. 2.  Fifth proximal phalangeal fracture.  Original Report Authenticated By: Reyes Ivan, M.D.    Brief H and P: For complete details, refer to admission H and P. However, in brief, this is a 66 year old female, with known history of diabetes mellitus, depression, hypertension, GERD, diverticular disease, rheumatoid arthritis, osteoarthritis, previous stroke, Bell's palsy and hypercholesterolemia, presenting following a fall while walking upstairs to the second floor. She lost her balance and fell backwards, hit her head, which went through the sheet rack. She had no loss of consciousness. She was able to climb back up the stairs obtain her cell phone and call her family. Family members called 911 and she was brought to the ED. She was admitted for further evaluation, investigation and management.  Physical Exam: On 01/20/12. General: Comfortably, alert, communicative, fully oriented, not short of breath at rest.  HEENT: Mild clinical pallor, no jaundice, no conjunctival injection or discharge. Hydration status is satisfactory.  NECK: Supple, JVP not seen, no carotid bruits, no palpable lymphadenopathy, no palpable goiter.  CHEST: Clinically clear to auscultation, no wheezes, no crackles.  HEART: Sounds 1 and 2 heard, normal, regular, no murmurs.  ABDOMEN: Moderately obese, soft, non-tender, no palpable organomegaly, no palpable masses, normal bowel sounds.  GENITALIA: Not examined.  LOWER EXTREMITIES: Right LE, no pitting edema, palpable peripheral pulses. Left LE, under dressings.  MUSCULOSKELETAL SYSTEM: Generalized osteoarthritic changes, otherwise, as  above.  CENTRAL NERVOUS SYSTEM: No focal neurologic deficit on gross examination.   Hospital Course:  Principal Problem:  *Tibia/fibula fracture: This was a complex fracture, secondary to a mechanical fall. Dr Malon Kindle provided orthopedic consultation, and Dr Toni Arthurs performed ORIF on 01/17/12. Post-op, patient ihas remained comfortable, pain-wise. Analgesics have been changed to prn, and physiotherapy is underway. Per orthopedics, patient will follow up with Dr Victorino Dike in 2 weeks. Continue NWB on LLE. Patient only needs Aspirin for DVT prophylaxis as an outpatient. Active Problems:  1. Diabetes mellitus: CBGs remained controlled during this hospitalization, on diet, SSI and Lantus.  2. Hypertension: This was controlled on pre-admission antihypertensives.  3. Depression: Mood remained  stable.  4. GERD (gastroesophageal reflux disease): Asymptomatic on PPI.  5. Rheumatoid arthritis: Stable/Quiescent.  6. H/O: CVA (cardiovascular accident)/Bell's palsy: Minimal deficit.  7. Recent Oral/Sinus Surgery: This was about one week ago. Patient was on Amoxicillin at the time of admission, started 01/06/12. This was discontinued on 01/19/12. Mucinex continues.  8. Anemia: This appears to be due to acute blood loss from fracture, on anemia of chronic disease. CBC remained stable at 8.4-8.6.   Comment: Cleared by orthopedics on 01/19/12, for discharge to SNF. Stable for discharge on 01/20/12.   Time spent on Discharge: 40 mins.  Signed: Sidi Dzikowski,CHRISTOPHER 01/20/2012, 12:22 PM

## 2012-01-20 ENCOUNTER — Encounter (HOSPITAL_COMMUNITY): Payer: Self-pay | Admitting: General Practice

## 2012-01-20 DIAGNOSIS — S82409A Unspecified fracture of shaft of unspecified fibula, initial encounter for closed fracture: Secondary | ICD-10-CM

## 2012-01-20 DIAGNOSIS — M069 Rheumatoid arthritis, unspecified: Secondary | ICD-10-CM

## 2012-01-20 DIAGNOSIS — I10 Essential (primary) hypertension: Secondary | ICD-10-CM

## 2012-01-20 DIAGNOSIS — S82209A Unspecified fracture of shaft of unspecified tibia, initial encounter for closed fracture: Secondary | ICD-10-CM

## 2012-01-20 DIAGNOSIS — E1165 Type 2 diabetes mellitus with hyperglycemia: Secondary | ICD-10-CM

## 2012-01-20 LAB — CBC
MCH: 30.9 pg (ref 26.0–34.0)
MCV: 91.6 fL (ref 78.0–100.0)
Platelets: 253 10*3/uL (ref 150–400)
RBC: 2.75 MIL/uL — ABNORMAL LOW (ref 3.87–5.11)

## 2012-01-20 LAB — BASIC METABOLIC PANEL
CO2: 31 mEq/L (ref 19–32)
Calcium: 9.3 mg/dL (ref 8.4–10.5)
Creatinine, Ser: 0.74 mg/dL (ref 0.50–1.10)
Glucose, Bld: 124 mg/dL — ABNORMAL HIGH (ref 70–99)

## 2012-01-20 LAB — GLUCOSE, CAPILLARY
Glucose-Capillary: 125 mg/dL — ABNORMAL HIGH (ref 70–99)
Glucose-Capillary: 158 mg/dL — ABNORMAL HIGH (ref 70–99)

## 2012-01-20 MED ORDER — DSS 100 MG PO CAPS: 100.0000 mg | ORAL_CAPSULE | Freq: Two times a day (BID) | ORAL | Status: AC

## 2012-01-20 MED ORDER — POLYETHYLENE GLYCOL 3350 17 G PO PACK: 17.0000 g | PACK | Freq: Every day | ORAL | Status: AC

## 2012-01-20 MED ORDER — ACETAMINOPHEN 325 MG PO TABS: 650.0000 mg | ORAL_TABLET | Freq: Four times a day (QID) | ORAL | Status: AC | PRN

## 2012-01-20 MED ORDER — INSULIN GLARGINE 100 UNIT/ML ~~LOC~~ SOLN: 25.0000 [IU] | Freq: Every day | SUBCUTANEOUS | Status: DC

## 2012-01-20 MED ORDER — OXYCODONE HCL 5 MG PO TABS: 5.0000 mg | ORAL_TABLET | ORAL | Status: AC | PRN

## 2012-01-20 NOTE — Progress Notes (Signed)
Occupational Therapy Treatment Patient Details Name: Tracey Mason MRN: [id number] DOB: 1946/06/14 Today's Date: 01/20/2012 Time: 8657-8469 OT Time Calculation (min): 24 min  OT Assessment / Plan / Recommendation Comments on Treatment Session Pt progressing with her functional transfers and helping with clothing manipulation; however she does fatigue with these activities easily. Instructed on theraband to help with building her activity tolerance and UE strength.     Follow Up Recommendations  Skilled nursing facility    Barriers to Discharge       Equipment Recommendations  Defer to next venue    Recommendations for Other Services    Frequency Min 2X/week   Plan Discharge plan remains appropriate    Precautions / Restrictions Precautions Precautions: Fall Restrictions Weight Bearing Restrictions: Yes LLE Weight Bearing: Non weight bearing        ADL  Toilet Transfer: Performed;Moderate assistance Toilet Transfer Method: Stand pivot Toilet Transfer Equipment: Bedside commode Toileting - Clothing Manipulation and Hygiene: Performed;Moderate assistance Where Assessed - Toileting Clothing Manipulation and Hygiene: clothing performed Sit to stand from 3-in-1 or toilet; pt performed hygiene seated with setup. Equipment Used: Rolling walker;Gait belt ADL Comments: Instructed pt on theraband exercises and pt performed 10 reps and 2 sets of bilateral shoulder flexion as well as horizontal abduction exercises. Pt encouraged to continue to perform exercises to help with increasing endurance and strength of UEs to help with maintaining her NWB status. Pt did better with her NWB during functional toilet transfer (one time a cue to lift foot more) but did need a lift under her arms to help with being able to take a "hop" step to [facility]. Otherwise she would have to slide her R foot around to pivot .     OT Diagnosis:    OT Problem List:   OT Treatment Interventions:     OT Goals ADL  Goals ADL Goal: Toilet Transfer - Progress: Progressing toward goals ADL Goal: Toileting - Clothing Manipulation - Progress: Other (comment) (pt performed in standing today) ADL Goal: Toileting - Hygiene - Progress: Progressing toward goals Arm Goals Arm Goal: Theraband Exercises - Progress: Progressing toward goal  Visit Information  Last OT Received On: 01/20/12 Assistance Needed: +1 (for pivot only)    Subjective Data  Subjective: I am supposed to go to rehab today Patient Stated Goal: I want to go home   Prior Functioning       Cognition  Overall Cognitive Status: Appears within functional limits for tasks assessed/performed Arousal/Alertness: Awake/alert Orientation Level: Appears intact for tasks assessed Behavior During Session: [facility] for tasks performed    Mobility   Transfers Transfers: Sit to Stand;Stand to Sit Sit to Stand: 3: Mod assist;With upper extremity assist;From chair/3-in-1 Stand to Sit: 3: Mod assist;With upper extremity assist;To chair/3-in-1 Details for Transfer Assistance: Cues for hand placement, LLE LE positioning, upright posture, anterior weight shift.  (A) for balance, anterior weight shift over BOS, controlled descent.    Exercises   Balance Balance Balance Assessed: Yes Dynamic Standing Balance Dynamic Standing - Level of Assistance: 3: Mod assist;Other (comment) (for pt to help with clothing manipulation)  End of Session OT - End of Session Equipment Utilized During Treatment: Gait belt Activity Tolerance: Patient limited by fatigue Patient left: in chair;with call bell/phone within reach   Lennox Laity [phone] 01/20/2012, 2:56 PM

## 2012-01-20 NOTE — Progress Notes (Addendum)
Physical Therapy Treatment Patient Details Name: Tracey Mason MRN: [id number] DOB: May 24, 1946 Today's Date: 01/20/2012 Time: 1203-1226 PT Time Calculation (min): 23 min  PT Assessment / Plan / Recommendation Comments on Treatment Session  Pt with improved ability to maintain NWBing LLE during ambulation today.  Pt very eager to d/c to next venue of care.      Follow Up Recommendations  Skilled nursing facility    Barriers to Discharge        Equipment Recommendations  Defer to next venue    Recommendations for Other Services    Frequency Min 3X/week   Plan Discharge plan remains appropriate    Precautions / Restrictions Precautions Precautions: Fall Restrictions Weight Bearing Restrictions: Yes LLE Weight Bearing: Non weight bearing       Mobility  Bed Mobility Bed Mobility: Supine to Sit;Sitting - Scoot to Edge of Bed Supine to Sit: 4: Min guard;With rails;HOB elevated (HOB elevated 20 degrees) Sitting - Scoot to Edge of Bed: 4: Min guard Details for Bed Mobility Assistance: Cues for technique, use of UE's to increase ease of transition, & encouragement to perform as independently as possible without requesting (A) automatically.  Transfers Transfers: Sit to Stand;Stand to Sit; Performed 3x's throughout session.   Sit to Stand: 4: Min assist;With upper extremity assist;With armrests;From bed;From chair/3-in-1 Stand to Sit: 3: Mod assist;With upper extremity assist;With armrests;To chair/3-in-1 Details for Transfer Assistance: Cues for hand placement, LLE LE positioning, upright posture, anterior weight shift.  (A) for balance, anterior weight shift over BOS, controlled descent.  Ambulation/Gait Ambulation/Gait Assistance: 3: Mod assist (+2 to follow with recliner) Ambulation/Gait: Patient Percentage: 70% Ambulation Distance (Feet): 10 Feet Assistive device: Rolling walker Ambulation/Gait Assistance Details: (A) provided to lift body up during "hop" to clear RLE off  floor.  Pt did much better with ability to maintain NWBing LLE.  Gait Pattern: Step-to pattern Stairs: No Wheelchair Mobility Wheelchair Mobility: No    Exercises General Exercises - Lower Extremity Quad Sets: AROM;Both;10 reps Long Arc Quad: Both;10 reps Straight Leg Raises: AAROM;AROM;Both;10 reps   PT Diagnosis:    PT Problem List:   PT Treatment Interventions:     PT Goals Acute Rehab PT Goals Time For Goal Achievement: 02/01/12 Potential to Achieve Goals: Good PT Goal: Supine/Side to Sit - Progress: Progressing toward goal PT Goal: Sit to Stand - Progress: Progressing toward goal PT Goal: Stand to Sit - Progress: Progressing toward goal PT Goal: Ambulate - Progress: Progressing toward goal  Visit Information  Last PT Received On: 01/20/12 Assistance Needed: +2 (+2 for safety to follow with recliner)    Subjective Data  Subjective: "Im ready to go home"   Cognition  Overall Cognitive Status: Appears within functional limits for tasks assessed/performed Arousal/Alertness: Awake/alert Orientation Level: Appears intact for tasks assessed Behavior During Session: [facility] for tasks performed    Balance  Balance Balance Assessed: No  End of Session PT - End of Session Equipment Utilized During Treatment: Gait belt Activity Tolerance: Patient tolerated treatment well Patient left: in chair;with call bell/phone within reach    Lara Mulch 01/20/2012, 1:56 PM  Verdell Face, PTA [phone] 01/20/2012

## 2012-01-20 NOTE — Progress Notes (Signed)
Utilization review completed. Advay Volante, RN, BSN.  01/20/12  

## 2013-02-25 ENCOUNTER — Emergency Department (HOSPITAL_COMMUNITY): Payer: Medicare Other

## 2013-02-25 ENCOUNTER — Encounter (HOSPITAL_COMMUNITY): Payer: Self-pay | Admitting: Emergency Medicine

## 2013-02-25 ENCOUNTER — Observation Stay (HOSPITAL_COMMUNITY)
Admission: EM | Admit: 2013-02-25 | Discharge: 2013-02-27 | Disposition: A | Discharge status: Home / Self Care | Payer: Medicare Other | Attending: Internal Medicine | Admitting: Internal Medicine

## 2013-02-25 DIAGNOSIS — R197 Diarrhea, unspecified: Secondary | ICD-10-CM | POA: Insufficient documentation

## 2013-02-25 DIAGNOSIS — E785 Hyperlipidemia, unspecified: Secondary | ICD-10-CM | POA: Insufficient documentation

## 2013-02-25 DIAGNOSIS — N390 Urinary tract infection, site not specified: Secondary | ICD-10-CM | POA: Insufficient documentation

## 2013-02-25 DIAGNOSIS — Z8673 Personal history of transient ischemic attack (TIA), and cerebral infarction without residual deficits: Secondary | ICD-10-CM | POA: Insufficient documentation

## 2013-02-25 DIAGNOSIS — I10 Essential (primary) hypertension: Secondary | ICD-10-CM | POA: Diagnosis present

## 2013-02-25 DIAGNOSIS — E119 Type 2 diabetes mellitus without complications: Secondary | ICD-10-CM | POA: Insufficient documentation

## 2013-02-25 DIAGNOSIS — R55 Syncope and collapse: Principal | ICD-10-CM | POA: Insufficient documentation

## 2013-02-25 DIAGNOSIS — I5189 Other ill-defined heart diseases: Secondary | ICD-10-CM | POA: Diagnosis present

## 2013-02-25 DIAGNOSIS — Z794 Long term (current) use of insulin: Secondary | ICD-10-CM | POA: Insufficient documentation

## 2013-02-25 HISTORY — DX: Inflammatory liver disease, unspecified: K75.9

## 2013-02-25 LAB — CBC WITH DIFFERENTIAL/PLATELET
Basophils Absolute: 0 10*3/uL (ref 0.0–0.1)
Basophils Relative: 0 % (ref 0–1)
Eosinophils Absolute: 0.1 10*3/uL (ref 0.0–0.7)
Eosinophils Relative: 1 % (ref 0–5)
HCT: 33.3 % — ABNORMAL LOW (ref 36.0–46.0)
Hemoglobin: 11.6 g/dL — ABNORMAL LOW (ref 12.0–15.0)
Lymphocytes Relative: 22 % (ref 12–46)
Lymphs Abs: 2.2 10*3/uL (ref 0.7–4.0)
MCH: 31.4 pg (ref 26.0–34.0)
MCHC: 34.8 g/dL (ref 30.0–36.0)
MCV: 90.2 fL (ref 78.0–100.0)
Monocytes Absolute: 0.8 10*3/uL (ref 0.1–1.0)
Monocytes Relative: 7 % (ref 3–12)
Neutro Abs: 7 10*3/uL (ref 1.7–7.7)
Neutrophils Relative %: 70 % (ref 43–77)
Platelets: 194 10*3/uL (ref 150–400)
RBC: 3.69 MIL/uL — ABNORMAL LOW (ref 3.87–5.11)
RDW: 12.9 % (ref 11.5–15.5)
WBC: 10.1 10*3/uL (ref 4.0–10.5)

## 2013-02-25 LAB — COMPREHENSIVE METABOLIC PANEL
Alkaline Phosphatase: 96 U/L (ref 39–117)
BUN: 20 mg/dL (ref 6–23)
CO2: 25 mEq/L (ref 19–32)
Chloride: 106 mEq/L (ref 96–112)
Creatinine, Ser: 1.03 mg/dL (ref 0.50–1.10)
GFR calc non Af Amer: 55 mL/min — ABNORMAL LOW (ref >90)
Total Bilirubin: 0.7 mg/dL (ref 0.3–1.2)

## 2013-02-25 LAB — CK: Total CK: 94 U/L (ref 7–177)

## 2013-02-25 LAB — URINALYSIS, ROUTINE W REFLEX MICROSCOPIC
Hgb urine dipstick: NEGATIVE
Ketones, ur: NEGATIVE mg/dL
Protein, ur: NEGATIVE mg/dL
Urobilinogen, UA: 1 mg/dL (ref 0.0–1.0)

## 2013-02-25 LAB — URINE MICROSCOPIC-ADD ON

## 2013-02-25 LAB — GLUCOSE, CAPILLARY: Glucose-Capillary: 69 mg/dL — ABNORMAL LOW (ref 70–99)

## 2013-02-25 MED ORDER — SODIUM CHLORIDE 0.9 % IV BOLUS (SEPSIS): 500.0000 mL | Freq: Once | INTRAVENOUS | Status: DC

## 2013-02-25 NOTE — ED Notes (Signed)
Patient returned from CT, placed back on cardiac monitor.  Patient states she is feeling better at this time.

## 2013-02-25 NOTE — ED Notes (Signed)
Patient given [place] sandwich and soda for low CBG.

## 2013-02-25 NOTE — ED Notes (Signed)
Patient now complaining of headache at this time.

## 2013-02-25 NOTE — ED Notes (Signed)
Pt presents to ED via EMS with complaints of syncopal episode. Patient was as the pool for 3 hours when she decided to go inside for food and she passed out. She was caught by someone and did not hit the ground. When EMS arrived patient was starting to become responsive again. Patient does not remember passing out. First BP 118/78 CBG 81 EMS placed PIV and started NS and gave 4mg  IV Zofran with some relief. Patient states she had some diarrhea this am and took 4 pills total today for diarrhea.

## 2013-02-25 NOTE — ED Notes (Signed)
Patient transported to CT 

## 2013-02-25 NOTE — ED Provider Notes (Signed)
History    CSN: [id number] Arrival date & time 02/25/13  1903  None    Chief Complaint  Patient presents with  . Loss of Consciousness   (Consider location/radiation/quality/duration/timing/severity/associated sxs/prior Treatment) HPI Tracey Mason is a 67 female with a past medical history of hypertension, GERD, rheumatoid arthritis, CVA, Bell's palsy, MI, and diabetes mellitus who presents emergency department after a syncopal episode. Patient was transported by EMS reports having a syncopal episode just prior to arrival. Patient states she was standing in the living room and "remembers waking up". According to family and friends the patient did not appear to be "right". Indicated she was walking into the house he felt as though she was walking more slowly and "somewhat slumped over". Prior to having a syncopal episode she denied any dizziness, nausea, diaphoresis, shortness of breath, chest pain, or palpitations. She reportedly was caught after having a syncopal episode and did not fall. She was reportedly unconscious for approximately 2-3 minutes and was disoriented for 5-10 minutes following. Upon regaining consciousness she denied chest pain, shortness of breath, dizziness, lightheadedness, diaphoresis, or headache. Patient then started developing headache once EMS arrived. He reports the headache is usual for him headaches in the past, located frontally, 4/10, nonradiating, no known exacerbating or relieving factors. Of concern, patient states that she nearly similar episode when she reportedly had a heart attack proximally one year ago. + history of diarrhea today x 2-3 episodes. Past Medical History  Diagnosis Date  . Depression   . Hypertension   . Acid reflux   . Diverticular disease   . Rheumatoid arthritis(714.0)   . Osteoarthritis   . Stroke   . Bell's palsy   . Hypercholesterolemia   . Myocardial infarction     ' i have been told I did "  . Diabetes mellitus     insulin  dependent  . Anemia   . H/O hiatal hernia    Past Surgical History  Procedure Laterality Date  . Appendectomy    . Tonsillectomy    . Cholecystectomy    . Abdominal hysterectomy    . Fracture surgery    . Orif tibia fracture  01/15/2012    tib /fib fracture   Family History  Problem Relation Age of Onset  . Diabetes type II    . Coronary artery disease     History  Substance Use Topics  . Smoking status: Former Games developer  . Smokeless tobacco: Never Used  . Alcohol Use: 0.5 oz/week    1 drink(s) per week     Comment: quit   OB History   Grav Para Term Preterm Abortions TAB SAB Ect Mult Living                 Review of Systems  Constitutional: Negative for fever, chills, diaphoresis, activity change and appetite change.  HENT: Negative for sore throat, rhinorrhea, sneezing, drooling and trouble swallowing.   Eyes: Negative for discharge and redness.  Respiratory: Negative for cough, chest tightness, shortness of breath, wheezing and stridor.   Cardiovascular: Negative for chest pain and leg swelling.  Gastrointestinal: Negative for nausea, vomiting, abdominal pain, diarrhea, constipation and blood in stool.  Genitourinary: Negative for difficulty urinating.  Musculoskeletal: Negative for myalgias and arthralgias.  Skin: Negative for pallor.  Neurological: Positive for syncope and headaches. Negative for dizziness, speech difficulty, weakness and light-headedness.  Hematological: Negative for adenopathy. Does not bruise/bleed easily.  Psychiatric/Behavioral: Negative for confusion and agitation.    Allergies  Prednisone and Cortisone  Home Medications   Current Outpatient Rx  Name  Route  Sig  Dispense  Refill  . amLODipine (NORVASC) 5 MG tablet   Oral   Take 5 mg by mouth daily.           . Ascorbic Acid (VITAMIN C) 1000 MG tablet   Oral   Take 1,000 mg by mouth daily.           Marland Kitchen aspirin 81 MG tablet   Oral   Take 81 mg by mouth daily.           .  Calcium Citrate-Vitamin D (CITRACAL + D PO)   Oral   Take 1 tablet by mouth 2 (two) times daily.         . cholecalciferol (VITAMIN D) 1000 UNITS tablet   Oral   Take 1,000 Units by mouth daily.           . insulin glargine (LANTUS) 100 UNIT/ML injection   Subcutaneous   Inject 50 Units into the skin at bedtime.         . insulin lispro (HUMALOG) 100 UNIT/ML injection   Subcutaneous   Inject 30 Units into the skin 3 (three) times daily before meals.         Marland Kitchen lisinopril (PRINIVIL,ZESTRIL) 20 MG tablet   Oral   Take 20 mg by mouth daily.           . meloxicam (MOBIC) 15 MG tablet   Oral   Take 15 mg by mouth daily.           . pantoprazole (PROTONIX) 40 MG tablet   Oral   Take 40 mg by mouth daily.           . simvastatin (ZOCOR) 40 MG tablet   Oral   Take 40 mg by mouth daily.            BP 116/55  Pulse 64  Resp 15  Ht 5' (1.524 m)  Wt 125 lb (56.7 kg)  BMI 24.41 kg/m2  SpO2 100% Physical Exam  Constitutional: She is oriented to person, place, and time. She appears well-developed and well-nourished. No distress.  HENT:  Head: Normocephalic and atraumatic.  Right Ear: External ear normal.  Left Ear: External ear normal.  Eyes: Conjunctivae and EOM are normal. Pupils are equal, round, and reactive to light. Right eye exhibits no discharge. Left eye exhibits no discharge. Right eye exhibits normal extraocular motion. Left eye exhibits normal extraocular motion.  Neck: Normal range of motion. Neck supple. No JVD present.  Cardiovascular: Normal rate, regular rhythm and normal heart sounds.  Exam reveals no gallop and no friction rub.   No murmur heard. Pulmonary/Chest: Effort normal and breath sounds normal. No stridor. No respiratory distress. She has no wheezes. She has no rales. She exhibits no tenderness.  Abdominal: Soft. Bowel sounds are normal. She exhibits no distension. There is no tenderness. There is no rebound and no guarding.   Musculoskeletal: Normal range of motion. She exhibits no edema.  Neurological: She is alert and oriented to person, place, and time. No cranial nerve deficit (CN3-12 grossly intact bilaterally).  Skin: Skin is warm. No rash noted. She is not diaphoretic.  Psychiatric: She has a normal mood and affect. Her behavior is normal.    ED Course  Procedures (including critical care time) Labs Reviewed  CBC WITH DIFFERENTIAL - Abnormal; Notable for the following:    RBC 3.69 (*)  Hemoglobin 11.6 (*)    HCT 33.3 (*)    All other components within normal limits  COMPREHENSIVE METABOLIC PANEL - Abnormal; Notable for the following:    GFR calc non Af Amer 55 (*)    GFR calc Af Amer 63 (*)    All other components within normal limits  URINALYSIS, ROUTINE W REFLEX MICROSCOPIC - Abnormal; Notable for the following:    APPearance CLOUDY (*)    Nitrite POSITIVE (*)    Leukocytes, UA LARGE (*)    All other components within normal limits  GLUCOSE, CAPILLARY - Abnormal; Notable for the following:    Glucose-Capillary 69 (*)    All other components within normal limits  URINE MICROSCOPIC-ADD ON - Abnormal; Notable for the following:    Bacteria, UA MANY (*)    All other components within normal limits  URINE CULTURE  CK  POCT I-STAT TROPONIN I   Ct Head Wo Contrast  02/25/2013   *RADIOLOGY REPORT*  Clinical Data: Loss of consciousness.  CT HEAD WITHOUT CONTRAST  Technique:  Contiguous axial images were obtained from the base of the skull through the vertex without contrast.  Comparison: 01/15/2012  Findings: Old lacunar infarcts in the basal ganglia bilaterally. No evidence for acute hemorrhage, mass lesion, midline shift, hydrocephalus or large new infarct.  Mild cerebral atrophy.  There is chronic sinus disease involving the right maxillary sinus.  The right maximal sinus is only partially imaged.  No acute bony abnormality.  IMPRESSION: No acute intracranial abnormality.  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   Original Report Authenticated By: Richarda Overlie, M.D.   1. Syncope    Results for orders placed during the hospital encounter of 02/25/13  CBC WITH DIFFERENTIAL      Result Value Range   WBC 10.1  4.0 - 10.5 K/uL   RBC 3.69 (*) 3.87 - 5.11 MIL/uL   Hemoglobin 11.6 (*) 12.0 - 15.0 g/dL   HCT 16.1 (*) 09.6 - 04.5 %   MCV 90.2  78.0 - 100.0 fL   MCH 31.4  26.0 - 34.0 pg   MCHC 34.8  30.0 - 36.0 g/dL   RDW 40.9  81.1 - 91.4 %   Platelets 194  150 - 400 K/uL   Neutrophils Relative % 70  43 - 77 %   Neutro Abs 7.0  1.7 - 7.7 K/uL   Lymphocytes Relative 22  12 - 46 %   Lymphs Abs 2.2  0.7 - 4.0 K/uL   Monocytes Relative 7  3 - 12 %   Monocytes Absolute 0.8  0.1 - 1.0 K/uL   Eosinophils Relative 1  0 - 5 %   Eosinophils Absolute 0.1  0.0 - 0.7 K/uL   Basophils Relative 0  0 - 1 %   Basophils Absolute 0.0  0.0 - 0.1 K/uL  COMPREHENSIVE METABOLIC PANEL      Result Value Range   Sodium 141  135 - 145 mEq/L   Potassium 3.7  3.5 - 5.1 mEq/L   Chloride 106  96 - 112 mEq/L   CO2 25  19 - 32 mEq/L   Glucose, Bld 70  70 - 99 mg/dL   BUN 20  6 - 23 mg/dL   Creatinine, Ser 7.82  0.50 - 1.10 mg/dL   Calcium 9.2  8.4 - 95.6 mg/dL   Total Protein 7.5  6.0 - 8.3 g/dL   Albumin 3.9  3.5 -  5.2 g/dL   AST 26  0 - 37 U/L   ALT 20  0 - 35 U/L   Alkaline Phosphatase 96  39 - 117 U/L   Total Bilirubin 0.7  0.3 - 1.2 mg/dL   GFR calc non Af Amer 55 (*) >90 mL/min   GFR calc Af Amer 63 (*) >90 mL/min  URINALYSIS, ROUTINE W REFLEX MICROSCOPIC      Result Value Range   Color, Urine YELLOW  YELLOW   APPearance CLOUDY (*) CLEAR   Specific Gravity, Urine 1.009  1.005 - 1.030   pH 5.5  5.0 - 8.0   Glucose, UA NEGATIVE  NEGATIVE mg/dL   Hgb urine dipstick NEGATIVE  NEGATIVE   Bilirubin Urine NEGATIVE  NEGATIVE   Ketones, ur NEGATIVE  NEGATIVE mg/dL   Protein, ur NEGATIVE  NEGATIVE mg/dL   Urobilinogen, UA 1.0  0.0 - 1.0 mg/dL    Nitrite POSITIVE (*) NEGATIVE   Leukocytes, UA LARGE (*) NEGATIVE  GLUCOSE, CAPILLARY      Result Value Range   Glucose-Capillary 69 (*) 70 - 99 mg/dL  CK      Result Value Range   Total CK 94  7 - 177 U/L  URINE MICROSCOPIC-ADD ON      Result Value Range   Squamous Epithelial / LPF RARE  RARE   WBC, UA 21-50  <3 WBC/hpf   RBC / HPF 0-2  <3 RBC/hpf   Bacteria, UA MANY (*) RARE  POCT I-STAT TROPONIN I      Result Value Range   Troponin i, poc 0.00  0.00 - 0.08 ng/mL   Comment 3              MDM  Carnella Guadalajara 67 y.o. with PMH of reported CVA, and reportedly MI which she reports occurred acutely 1 year ago that resulted in a similar syncopal episode and a orthopedic injury. Afebrile vital signs stable. Soft right upper sternal boarder murmur. No carotid bruits bilaterally. No pulsatile abdominal masses. Cranial nerves grossly intact bilaterally. No strength deficits of all 4 extremities. Patient alert and oriented x4. Syncopal workup indicated. Also concern for ACS in the setting of similar syncopal episode with reported MI approximately one year ago. ECG unchanged from previous. Initial troponin negative. Hemoglobin stable. No electrolyte abnormalities. CT negative for acute intracranial injury. UA consistent with UTI. Admission for syncopal workup indicated including likely MRI and TTE. 1 g Rocephin given for UTI. Patient care discussed with my attending, Dr. Preston Fleeting.   Sena Hitch, MD 02/26/13 [phone]

## 2013-02-25 NOTE — ED Provider Notes (Signed)
67 year old female had a syncopal episode. She was standing waiting for her daughter to come out of the bathroom and she passed out without warning. There were bystanders there to help ease her to the ground so she did not fall per se. Also consciousness was estimated at about 30 seconds and she was confused upon awakening. It took more than 20 minutes for her to regain normal mentation. There was no palpitations, no dizziness, no nausea, no diaphoresis. She feels like she is back to her baseline now. She states that she had been told that she had a heart attack when she fell one year ago and had surgery for an ankle fracture. Her exam is significant for transmitted murmur versus carotid bruit and bilateral carotid arteries. There is a 1-2/6 systolic ejection murmur heard at the cardiac base which radiates up toward the neck. Heart has regular rate and rhythm. Neurologic exam is normal. He reviewed her old records and she has a heart catheterization in 2009 which showed only minimal luminal irregularities. Review of her hospital stay for her ankle fracture have no mention of myocardial infarction, no cardiology consultation, no troponin level having been checked. However, ECG is obtained at that time will did show evidence of an old inferior wall myocardial infarction. It is worse and that she had syncope without any warning and will need to be admitted.   Date: 02/25/2013  Rate: 69  Rhythm: normal sinus rhythm  QRS Axis: normal  Intervals: normal  ST/T Wave abnormalities: nonspecific ST changes  Conduction Disutrbances:none  Narrative Interpretation: Minor nonspecific ST changes. When compared with ECG of 01/15/2012, No significant changes are seen.  Old EKG Reviewed: unchanged  I saw and evaluated the patient, reviewed the resident's note and I agree with the findings and plan.    Dione Booze, MD 02/25/13 2115

## 2013-02-26 ENCOUNTER — Encounter (HOSPITAL_COMMUNITY): Payer: Self-pay | Admitting: Anesthesiology

## 2013-02-26 DIAGNOSIS — R197 Diarrhea, unspecified: Secondary | ICD-10-CM | POA: Diagnosis present

## 2013-02-26 DIAGNOSIS — R55 Syncope and collapse: Secondary | ICD-10-CM

## 2013-02-26 DIAGNOSIS — E119 Type 2 diabetes mellitus without complications: Secondary | ICD-10-CM

## 2013-02-26 DIAGNOSIS — N390 Urinary tract infection, site not specified: Secondary | ICD-10-CM

## 2013-02-26 DIAGNOSIS — I10 Essential (primary) hypertension: Secondary | ICD-10-CM

## 2013-02-26 LAB — CBC
MCH: 31.5 pg (ref 26.0–34.0)
MCHC: 34.4 g/dL (ref 30.0–36.0)
MCV: 91.5 fL (ref 78.0–100.0)
Platelets: 186 10*3/uL (ref 150–400)
RDW: 13.1 % (ref 11.5–15.5)

## 2013-02-26 LAB — BASIC METABOLIC PANEL
CO2: 24 mEq/L (ref 19–32)
Calcium: 8.6 mg/dL (ref 8.4–10.5)
Creatinine, Ser: 0.99 mg/dL (ref 0.50–1.10)
Glucose, Bld: 136 mg/dL — ABNORMAL HIGH (ref 70–99)

## 2013-02-26 LAB — GLUCOSE, CAPILLARY
Glucose-Capillary: 167 mg/dL — ABNORMAL HIGH (ref 70–99)
Glucose-Capillary: 192 mg/dL — ABNORMAL HIGH (ref 70–99)
Glucose-Capillary: 217 mg/dL — ABNORMAL HIGH (ref 70–99)

## 2013-02-26 LAB — LIPID PANEL
Cholesterol: 163 mg/dL (ref 0–200)
HDL: 30 mg/dL — ABNORMAL LOW (ref >39)
Total CHOL/HDL Ratio: 5.4 RATIO

## 2013-02-26 LAB — TROPONIN I: Troponin I: <0.3 ng/mL (ref <0.30)

## 2013-02-26 MED ORDER — SODIUM CHLORIDE 0.9 % IV SOLN
INTRAVENOUS | Status: AC
  Administered 2013-02-26: 02:00:00 via INTRAVENOUS

## 2013-02-26 MED ORDER — DEXTROSE 5 % IV SOLN
1.0000 g | INTRAVENOUS | Status: DC
  Administered 2013-02-26: 1 g via INTRAVENOUS
  Filled 2013-02-26 (×2): qty 10

## 2013-02-26 MED ORDER — INSULIN GLARGINE 100 UNIT/ML ~~LOC~~ SOLN
50.0000 [IU] | Freq: Every day | SUBCUTANEOUS | Status: DC
  Administered 2013-02-26: 50 [IU] via SUBCUTANEOUS
  Filled 2013-02-26 (×2): qty 0.5

## 2013-02-26 MED ORDER — ASPIRIN 81 MG PO CHEW
81.0000 mg | CHEWABLE_TABLET | Freq: Every day | ORAL | Status: DC
  Administered 2013-02-26: 81 mg via ORAL
  Filled 2013-02-26 (×2): qty 1

## 2013-02-26 MED ORDER — ATORVASTATIN CALCIUM 20 MG PO TABS
20.0000 mg | ORAL_TABLET | Freq: Every day | ORAL | Status: DC
  Administered 2013-02-26: 20 mg via ORAL
  Filled 2013-02-26 (×2): qty 1

## 2013-02-26 MED ORDER — SODIUM CHLORIDE 0.9 % IJ SOLN
3.0000 mL | Freq: Two times a day (BID) | INTRAMUSCULAR | Status: DC
  Administered 2013-02-26 (×2): 3 mL via INTRAVENOUS

## 2013-02-26 MED ORDER — AMLODIPINE BESYLATE 5 MG PO TABS
5.0000 mg | ORAL_TABLET | Freq: Every day | ORAL | Status: DC
  Administered 2013-02-26: 5 mg via ORAL
  Filled 2013-02-26 (×2): qty 1

## 2013-02-26 MED ORDER — DEXTROSE 5 % IV SOLN
1.0000 g | Freq: Once | INTRAVENOUS | Status: AC
  Administered 2013-02-26: 1 g via INTRAVENOUS
  Filled 2013-02-26: qty 10

## 2013-02-26 MED ORDER — PANTOPRAZOLE SODIUM 40 MG PO TBEC
40.0000 mg | DELAYED_RELEASE_TABLET | Freq: Every day | ORAL | Status: DC
  Administered 2013-02-26: 40 mg via ORAL
  Filled 2013-02-26: qty 1

## 2013-02-26 MED ORDER — SIMVASTATIN 40 MG PO TABS
40.0000 mg | ORAL_TABLET | Freq: Every day | ORAL | Status: DC
  Administered 2013-02-26: 40 mg via ORAL
  Filled 2013-02-26: qty 1

## 2013-02-26 MED ORDER — LISINOPRIL 20 MG PO TABS
20.0000 mg | ORAL_TABLET | Freq: Every day | ORAL | Status: DC
  Administered 2013-02-26: 20 mg via ORAL
  Filled 2013-02-26: qty 1

## 2013-02-26 MED ORDER — ENOXAPARIN SODIUM 30 MG/0.3ML ~~LOC~~ SOLN
30.0000 mg | SUBCUTANEOUS | Status: DC
  Administered 2013-02-26: 30 mg via SUBCUTANEOUS
  Filled 2013-02-26 (×2): qty 0.3

## 2013-02-26 MED ORDER — LISINOPRIL 10 MG PO TABS
10.0000 mg | ORAL_TABLET | Freq: Every day | ORAL | Status: DC
  Filled 2013-02-26: qty 1

## 2013-02-26 NOTE — Discharge Summary (Addendum)
Physician Discharge Summary  Tracey Mason ZOX:[id number] DOB: 29-Aug-1945 DOA: 02/25/2013  PCP: Minda Meo, MD  Admit date: 02/25/2013 Discharge date: 02/26/2013  Time spent 30 minutes Recommendations for Outpatient Follow-up:   1. Syncope; Pt's head CT showed only old CVA, and Dopplers of the lower extremity are unremarkable.  2-D cardiac echo showed grade 1 diastolic dysfunction. Patient to obtain a thyroid panel as an outpatient.  2. DM;Pt to obtain hemoglobin A1c as outpatient, continue current regimen 3. HTN; BP within ADA/AHA guidelines, however taking into account patient's age her BP medication may be too strong. Decreased lisinopril to 10 mg daily, patient to followup with PCP for BP medication titration 4. HLD;  Restart Zocor 40 mg daily followup with PCP to discuss either increase in Zocor, or addition of niacin.. 5. UTI; D/C ceftriaxone IV, no organism identified but will change antibiotic to ciprofloxacin for 4 dys and discharge today.  Discharge Diagnoses:  Principal Problem:   Syncope Active Problems:   Diabetes mellitus   Hypertension   Hyperlipidemia   h/o cva   UTI (lower urinary tract infection)   Diarrhea   Discharge Condition: Stable Diet recommendation: Diabetic  Filed Weights   02/25/13 1907 02/26/13 0222  Weight: 56.7 kg (125 lb) 56.382 kg (124 lb 4.8 oz)    History of present illness:  67 yo female passed out in front of her family members today briefly was caught by family so did not suffer any trauma from fall. She quickly returned to normal was loc for about 2 minutes no seizure activity noted. Pt denies any problems speaking, or with movement of arms/legs. She denies any recent fevers. No nausea. But she did have several episodes of diarrhea nonbloody this am which has resolved. Also has had some dysuria about 2 days ago. No abd pain. No headache. No cp or sob. No le edema or swelling. Feels back to normal now. TODAY states feels back to baseline  and would like to be discharged. 2-D echo; showed grade 1 diastolic dysfunction    Hospital Course:  Patient admitted on 02/25/2013 secondary to syncopal event all studies unremarkable except for a UA which showed patient to have UTI, 2-D cardiac echo which showed patient had grade 1 diastolic dysfunction patient was also noted to not be within NCEP/ADA guideline for cholesterol control the pressure medication was decreased secondary to patient's BP running continuously low while hospitalized constipation this could be a cause for her syncope patient stable for discharge.    Head CT 02/25/2013 No acute intracranial abnormality.  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 2D Cardiac Echo LV cavity size was normal. Systolic function was normal. LVEF= 60% - 65%. Regional wall motion abnormalities cannot be excluded. Doppler parameters c/w abnormal left ventricular relaxation (grade 1 diastolic dysfunction).   Carotid Doppler Bilateral: Less than 39% ICA stenosis. Vertebral artery flow is antegrade.     Discharge Exam: Filed Vitals:   02/26/13 0222 02/26/13 0223 02/26/13 0224 02/26/13 0227  BP: 135/68 134/70 137/65 147/74  Pulse: 63 65 65 65  Temp: 97.9 F (36.6 C)     TempSrc: Oral     Resp: 18 18 18 18   Height: 5' 1.4" (1.56 m)     Weight: 56.382 kg (124 lb 4.8 oz)     SpO2: 98% 98% 98% 98%    General: Alert,NAD Cardiovascular: Regular rhythm and rate, negative murmurs rubs or gallops, DP/PT pulses 2+ Respiratory: Clear to  auscultation bilateral  Musculoskeletal; left ankle remains swollen secondary to patient's previous fracture.  Discharge Instructions     Medication List    ASK your doctor about these medications       amLODipine 5 MG tablet  Commonly known as:  NORVASC  Take 5 mg by mouth daily.     aspirin 81 MG tablet  Take 81 mg by mouth daily.     cholecalciferol 1000 UNITS tablet  Commonly known as:   VITAMIN D  Take 1,000 Units by mouth daily.     CITRACAL + D PO  Take 1 tablet by mouth 2 (two) times daily.     DULoxetine 60 MG capsule  Commonly known as:  CYMBALTA  Take 60 mg by mouth daily.     insulin glargine 100 UNIT/ML injection  Commonly known as:  LANTUS  Inject 50 Units into the skin at bedtime.     insulin lispro 100 UNIT/ML injection  Commonly known as:  HUMALOG  Inject 30 Units into the skin 3 (three) times daily before meals.     lisinopril 20 MG tablet  Commonly known as:  PRINIVIL,ZESTRIL  Take 20 mg by mouth daily.     meloxicam 15 MG tablet  Commonly known as:  MOBIC  Take 15 mg by mouth daily.     pantoprazole 40 MG tablet  Commonly known as:  PROTONIX  Take 40 mg by mouth daily.     simvastatin 40 MG tablet  Commonly known as:  ZOCOR  Take 40 mg by mouth daily.     vitamin C 1000 MG tablet  Take 1,000 mg by mouth daily.       Allergies  Allergen Reactions  . Prednisone Other (See Comments)    Causes stroke  . Cortisone Other (See Comments)    Causes stroke      The results of significant diagnostics from this hospitalization (including imaging, microbiology, ancillary and laboratory) are listed below for reference.    Significant Diagnostic Studies: Ct Head Wo Contrast  02/25/2013   *RADIOLOGY REPORT*  Clinical Data: Loss of consciousness.  CT HEAD WITHOUT CONTRAST  Technique:  Contiguous axial images were obtained from the base of the skull through the vertex without contrast.  Comparison: 01/15/2012  Findings: Old lacunar infarcts in the basal ganglia bilaterally. No evidence for acute hemorrhage, mass lesion, midline shift, hydrocephalus or large new infarct.  Mild cerebral atrophy.  There is chronic sinus disease involving the right maxillary sinus.  The right maximal sinus is only partially imaged.  No acute bony abnormality.  IMPRESSION: No acute intracranial abnormality.  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  Chronic  disease in the right maxillary sinus.   Original Report Authenticated By: Richarda Overlie, M.D.    Microbiology: No results found for this or any previous visit (from the past 240 hour(s)).   Labs: Basic Metabolic Panel:  Recent Labs Lab 02/25/13 1945 02/26/13 0255  NA 141 137  K 3.7 4.1  CL 106 104  CO2 25 24  GLUCOSE 70 136*  BUN 20 19  CREATININE 1.03 0.99  CALCIUM 9.2 8.6   Liver Function Tests:  Recent Labs Lab 02/25/13 1945  AST 26  ALT 20  ALKPHOS 96  BILITOT 0.7  PROT 7.5  ALBUMIN 3.9   No results found for this basename: LIPASE, AMYLASE,  in the last 168 hours No results found for this basename: AMMONIA,  in the last 168 hours CBC:  Recent Labs  Lab 02/25/13 1945 02/26/13 0255  WBC 10.1 8.5  NEUTROABS 7.0  --   HGB 11.6* 10.8*  HCT 33.3* 31.4*  MCV 90.2 91.5  PLT 194 186   Cardiac Enzymes:  Recent Labs Lab 02/25/13 1945 02/26/13 0255  CKTOTAL 94  --   TROPONINI  --  <0.30   BNP: BNP (last 3 results) No results found for this basename: PROBNP,  in the last 8760 hours CBG:  Recent Labs Lab 02/25/13 1942 02/26/13 0221  GLUCAP 69* 135*       Signed:  Deiona Hooper, J  [facility] 02/26/2013, 7:20 AM

## 2013-02-26 NOTE — Progress Notes (Signed)
  Echocardiogram 2D Echocardiogram has been performed.  Deetra Booton 02/26/2013, 2:10 PM

## 2013-02-26 NOTE — Progress Notes (Signed)
VASCULAR LAB PRELIMINARY  PRELIMINARY  PRELIMINARY  PRELIMINARY  Carotid duplex  completed.    Preliminary report:  Bilateral:  Less than 39% ICA stenosis.  Vertebral artery flow is antegrade.      Gabrielle Wakeland, RVT 02/26/2013, 11:32 AM

## 2013-02-26 NOTE — Progress Notes (Signed)
Nutrition Brief Note  Patient identified on the Malnutrition Screening Tool (MST) Report.  Per chart review, pt reports weight loss within the past 4 years. Per EPIC weights, pt without significant wt loss. Currently eating well.  Body mass index is 23.17 kg/(m^2). Patient meets criteria for normal weight based on current BMI.   Current diet order is Carbohydrate Modified Medium, patient is consuming approximate 100% of meals at this time. Labs and medications reviewed.   No nutrition interventions warranted at this time. If nutrition issues arise, please consult RD.   Tracey Motto MS, RD, LDN Pager: [phone] After-hours pager: [phone]

## 2013-02-26 NOTE — H&P (Signed)
PCP:   Minda Meo, MD   Chief Complaint:  Passed out  HPI: 67 yo female passed out in front of her family members today briefly was caught by family so did not suffer any trauma from fall.  She quickly returned to normal was loc for about 2 minutes no seizure activity noted.  Pt denies any problems speaking, or with movement of arms/legs.  She denies any recent fevers.  No nausea.  But she did have several episodes of diarrhea nonbloody this am which has resolved.  Also has had some dysuria about 2 days ago.  No abd pain.  No headache.  No cp or sob.  No le edema or swelling.  Feels back to normal now.    Review of Systems:  Positive and negative as per HPI otherwise all other systems are negative  Past Medical History: Past Medical History  Diagnosis Date  . Depression   . Hypertension   . Acid reflux   . Diverticular disease   . Rheumatoid arthritis(714.0)   . Osteoarthritis   . Stroke   . Bell's palsy   . Hypercholesterolemia   . Myocardial infarction     ' i have been told I did "  . Diabetes mellitus     insulin dependent  . Anemia   . H/O hiatal hernia    Past Surgical History  Procedure Laterality Date  . Appendectomy    . Tonsillectomy    . Cholecystectomy    . Abdominal hysterectomy    . Fracture surgery    . Orif tibia fracture  01/15/2012    tib /fib fracture    Medications: Prior to Admission medications   Medication Sig Start Date End Date Taking? Authorizing Provider  amLODipine (NORVASC) 5 MG tablet Take 5 mg by mouth daily.     Yes Historical Provider, MD  Ascorbic Acid (VITAMIN C) 1000 MG tablet Take 1,000 mg by mouth daily.     Yes Historical Provider, MD  aspirin 81 MG tablet Take 81 mg by mouth daily.     Yes Historical Provider, MD  Calcium Citrate-Vitamin D (CITRACAL + D PO) Take 1 tablet by mouth 2 (two) times daily.   Yes Historical Provider, MD  cholecalciferol (VITAMIN D) 1000 UNITS tablet Take 1,000 Units by mouth daily.     Yes  Historical Provider, MD  insulin glargine (LANTUS) 100 UNIT/ML injection Inject 50 Units into the skin at bedtime.   Yes Historical Provider, MD  insulin lispro (HUMALOG) 100 UNIT/ML injection Inject 30 Units into the skin 3 (three) times daily before meals.   Yes Historical Provider, MD  lisinopril (PRINIVIL,ZESTRIL) 20 MG tablet Take 20 mg by mouth daily.     Yes Historical Provider, MD  meloxicam (MOBIC) 15 MG tablet Take 15 mg by mouth daily.     Yes Historical Provider, MD  pantoprazole (PROTONIX) 40 MG tablet Take 40 mg by mouth daily.     Yes Historical Provider, MD  simvastatin (ZOCOR) 40 MG tablet Take 40 mg by mouth daily.     Yes Historical Provider, MD    Allergies:   Allergies  Allergen Reactions  . Prednisone Other (See Comments)    Causes stroke  . Cortisone Other (See Comments)    Causes stroke    Social History:  reports that she has quit smoking. She has never used smokeless tobacco. She reports that she drinks about 0.5 ounces of alcohol per week. She reports that she does not use illicit  drugs.  Family History: Family History  Problem Relation Age of Onset  . Diabetes type II    . Coronary artery disease      Physical Exam: Filed Vitals:   02/25/13 2133 02/25/13 2200 02/25/13 2230 02/25/13 2300  BP: 113/51 114/51 122/62 109/54  Pulse: 82 83 83 70  TempSrc:      Resp: 17 17 18 19   Height:      Weight:      SpO2: 97% 99% 99% 97%   General appearance: alert, cooperative and no distress Head: Normocephalic, without obvious abnormality, atraumatic Eyes: negative Nose: Nares normal. Septum midline. Mucosa normal. No drainage or sinus tenderness. Neck: no JVD and supple, symmetrical, trachea midline Lungs: clear to auscultation bilaterally Heart: regular rate and rhythm, S1, S2 normal, no murmur, click, rub or gallop Abdomen: soft, non-tender; bowel sounds normal; no masses,  no organomegaly Extremities: extremities normal, atraumatic, no cyanosis or  edema Pulses: 2+ and symmetric Skin: Skin color, texture, turgor normal. No rashes or lesions Neurologic: Grossly normal    Labs on Admission:   Recent Labs  02/25/13 1945  NA 141  K 3.7  CL 106  CO2 25  GLUCOSE 70  BUN 20  CREATININE 1.03  CALCIUM 9.2    Recent Labs  02/25/13 1945  AST 26  ALT 20  ALKPHOS 96  BILITOT 0.7  PROT 7.5  ALBUMIN 3.9    Recent Labs  02/25/13 1945  WBC 10.1  NEUTROABS 7.0  HGB 11.6*  HCT 33.3*  MCV 90.2  PLT 194    Recent Labs  02/25/13 1945  CKTOTAL 94   Radiological Exams on Admission: Ct Head Wo Contrast  02/25/2013   *RADIOLOGY REPORT*  Clinical Data: Loss of consciousness.  CT HEAD WITHOUT CONTRAST  Technique:  Contiguous axial images were obtained from the base of the skull through the vertex without contrast.  Comparison: 01/15/2012  Findings: Old lacunar infarcts in the basal ganglia bilaterally. No evidence for acute hemorrhage, mass lesion, midline shift, hydrocephalus or large new infarct.  Mild cerebral atrophy.  There is chronic sinus disease involving the right maxillary sinus.  The right maximal sinus is only partially imaged.  No acute bony abnormality.  IMPRESSION: No acute intracranial abnormality.  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   Original Report Authenticated By: Richarda Overlie, M.D.    Assessment/Plan  67 yo female with syncopal episode, uti Principal Problem:   Syncope Active Problems:   Diabetes mellitus   Hypertension   Hyperlipidemia   h/o cva   UTI (lower urinary tract infection)  Ck orthostatics.  Place on rocephin for uti.  ivf overnight.  Obs, ck carotid u/s and 2D echo.  Romi.  ekg nsr no changes from prior ekg.  If diarrhea recurs, ck studies.  Neuro cks overnight also.  Neuro exam is normal now.    Ife Vitelli A 02/26/2013, 12:00 AM

## 2013-02-27 DIAGNOSIS — I5189 Other ill-defined heart diseases: Secondary | ICD-10-CM | POA: Diagnosis present

## 2013-02-27 DIAGNOSIS — E785 Hyperlipidemia, unspecified: Secondary | ICD-10-CM

## 2013-02-27 LAB — HEMOGLOBIN A1C
Hgb A1c MFr Bld: 6.8 % — ABNORMAL HIGH (ref <5.7)
Hgb A1c MFr Bld: 6.8 % — ABNORMAL HIGH (ref <5.7)
Mean Plasma Glucose: 148 mg/dL — ABNORMAL HIGH (ref <117)
Mean Plasma Glucose: 148 mg/dL — ABNORMAL HIGH (ref <117)

## 2013-02-27 LAB — TSH: TSH: 2.141 u[IU]/mL (ref 0.350–4.500)

## 2013-02-27 LAB — T4, FREE: Free T4: 1.25 ng/dL (ref 0.80–1.80)

## 2013-02-27 LAB — GLUCOSE, CAPILLARY: Glucose-Capillary: 144 mg/dL — ABNORMAL HIGH (ref 70–99)

## 2013-02-27 MED ORDER — CIPROFLOXACIN HCL 250 MG PO TABS: 250.0000 mg | ORAL_TABLET | Freq: Two times a day (BID) | ORAL | Status: AC

## 2013-02-27 MED ORDER — LISINOPRIL 10 MG PO TABS: 10.0000 mg | ORAL_TABLET | Freq: Every day | ORAL | Status: DC

## 2013-02-27 MED ORDER — CIPROFLOXACIN HCL 250 MG PO TABS: 250.0000 mg | ORAL_TABLET | Freq: Two times a day (BID) | ORAL | Status: DC

## 2013-02-27 MED ORDER — LISINOPRIL 10 MG PO TABS: 10.0000 mg | ORAL_TABLET | Freq: Every day | ORAL | Status: AC

## 2013-02-27 MED ORDER — CIPROFLOXACIN HCL 250 MG PO TABS
250.0000 mg | ORAL_TABLET | Freq: Two times a day (BID) | ORAL | Status: DC
Start: 2013-02-27 — End: 2013-02-27
  Filled 2013-02-27 (×3): qty 1

## 2013-02-27 NOTE — Progress Notes (Signed)
Reviewed d/c instructions with patient. Assessment unchanged from this am. D/c'd via wheelchair to private vehicle with family

## 2013-02-28 LAB — URINE CULTURE: Colony Count: >=100000

## 2013-09-15 ENCOUNTER — Encounter (INDEPENDENT_AMBULATORY_CARE_PROVIDER_SITE_OTHER): Payer: Self-pay

## 2013-09-15 ENCOUNTER — Ambulatory Visit (INDEPENDENT_AMBULATORY_CARE_PROVIDER_SITE_OTHER): Payer: Medicare HMO | Admitting: Neurology

## 2013-09-15 ENCOUNTER — Ambulatory Visit (INDEPENDENT_AMBULATORY_CARE_PROVIDER_SITE_OTHER): Payer: Self-pay

## 2013-09-15 DIAGNOSIS — M6281 Muscle weakness (generalized): Secondary | ICD-10-CM

## 2013-09-15 DIAGNOSIS — R269 Unspecified abnormalities of gait and mobility: Secondary | ICD-10-CM

## 2013-09-15 DIAGNOSIS — R209 Unspecified disturbances of skin sensation: Secondary | ICD-10-CM

## 2013-09-15 DIAGNOSIS — Z0289 Encounter for other administrative examinations: Secondary | ICD-10-CM

## 2013-09-15 NOTE — Procedures (Signed)
HISTORY:  Tracey Mason is a 68 year old patient with a history of diabetes and a 5 year history of some difficulty with gait instability. The patient has had an increase in frequency of falls since November 2014. The patient is being evaluated for a possible neuropathy or a lumbosacral radiculopathy. The patient does report some numbness of the distal portions of the feet bilaterally. The patient has some back pain and pain down both legs.  NERVE CONDUCTION STUDIES:  Nerve conduction studies were performed on both lower extremities. The distal motor latencies for the peroneal nerves were normal bilaterally, with a low motor amplitude on the right, normal on the left. The distal motor latencies and motor amplitudes for the posterior tibial nerves were normal bilaterally. The nerve conduction velocities for the peroneal and posterior tibial nerves were normal bilaterally. The H reflex latencies were symmetric and normal, and the peroneal sensory latencies were symmetric and normal.  EMG STUDIES:  EMG study was performed on the right lower extremity:  The tibialis anterior muscle reveals 2 to 4K motor units with full recruitment. No fibrillations or positive waves were seen. The peroneus tertius muscle reveals 2 to 4K motor units with full recruitment. No fibrillations or positive waves were seen. The medial gastrocnemius muscle reveals 1 to 3K motor units with full recruitment. No fibrillations or positive waves were seen. The vastus lateralis muscle reveals 2 to 4K motor units with full recruitment. No fibrillations or positive waves were seen. The iliopsoas muscle reveals 2 to 4K motor units with full recruitment. No fibrillations or positive waves were seen. The biceps femoris muscle (long head) reveals 2 to 4K motor units with full recruitment. No fibrillations or positive waves were seen. The lumbosacral paraspinal muscles were tested at 3 levels, and revealed no abnormalities of  insertional activity at all 3 levels tested. There was good relaxation.  EMG study was performed on the left lower extremity:  The tibialis anterior muscle reveals 2 to 3K motor units with full recruitment. No fibrillations or positive waves were seen. The peroneus tertius muscle reveals 2 to 3K motor units with full recruitment. No fibrillations or positive waves were seen. The medial gastrocnemius muscle reveals 1 to 3K motor units with full recruitment. No fibrillations or positive waves were seen. The vastus lateralis muscle reveals 2 to 4K motor units with full recruitment. No fibrillations or positive waves were seen. The iliopsoas muscle reveals 2 to 4K motor units with full recruitment. No fibrillations or positive waves were seen. The biceps femoris muscle (long head) reveals 2 to 4K motor units with full recruitment. No fibrillations or positive waves were seen. The lumbosacral paraspinal muscles were tested at 3 levels, and revealed no abnormalities of insertional activity at all 3 levels tested. There was good relaxation.   IMPRESSION:  Nerve conduction studies done on both lower extremities shows some low motor amplitudes of the right peroneal nerve with preservation of sensory latencies. There is no clear evidence of an overlying peripheral neuropathy, but a small fiber neuropathy or an early sensorimotor peripheral neuropathy may be missed by standard nerve conduction studies. Clinical correlation is required. EMG evaluation of both lower extremities is unremarkable without evidence of an overlying lumbosacral radiculopathy on either side. The abnormalities of the right peroneal nerve by nerve conduction studies are likely related to distal dysfunction of the nerve.  Marlan Palau MD 09/15/2013 4:39 PM  [facility] [address] [place], [place] [address]  Phone [phone]  Fax [phone]

## 2013-09-23 ENCOUNTER — Encounter: Payer: Self-pay | Admitting: Neurology

## 2013-09-26 ENCOUNTER — Ambulatory Visit (INDEPENDENT_AMBULATORY_CARE_PROVIDER_SITE_OTHER): Payer: Medicare HMO | Admitting: Neurology

## 2013-09-26 ENCOUNTER — Encounter: Payer: Self-pay | Admitting: Neurology

## 2013-09-26 VITALS — BP 138/71 | HR 71 | Ht 60.0 in | Wt 130.0 lb

## 2013-09-26 DIAGNOSIS — R269 Unspecified abnormalities of gait and mobility: Secondary | ICD-10-CM

## 2013-09-26 DIAGNOSIS — R413 Other amnesia: Secondary | ICD-10-CM

## 2013-09-26 HISTORY — DX: Other amnesia: R41.3

## 2013-09-26 HISTORY — DX: Unspecified abnormalities of gait and mobility: R26.9

## 2013-09-26 NOTE — Progress Notes (Addendum)
Reason for visit: Gait disorder  Tracey Mason is a 68 y.o. female  History of present illness:  Tracey Mason is a 68 year old white female with a history of diabetes, hypertension, and dyslipidemia. The patient indicates that she has been using a cane off and on since she fell after a heart attack 2 years ago and fractured her left leg. The patient underwent some physical therapy following this, but beginning 6-8 months ago, the patient began having some worsening of gait. The patient has had some issues with memory over the last 5 years, and this has gradually worsened over time. The patient gives a history of cerebrovascular disease with right-sided weakness previously. A recent CT scan of the brain done in July 2014 showed bilateral lacunar infarcts in the basal ganglia areas. The patient believes that there has been a recent change in her bladder function, with urgency unassociated with incontinence. The patient has frequency of urination as well. The patient has had increasing problems with falling, even while using a cane. The patient last fell 1 day prior to this evaluation. The patient will usually topple forward, and at times she feels as if her body is getting ahead of her legs. The patient denies any tremors. The patient reports no numbness or weakness of the extremities. The patient does have some left-sided neck pain, and some low back pain without radiation down the legs. The patient recently underwent nerve conduction studies that did not show evidence of a significant peripheral neuropathy. EMG evaluation did not show evidence of a lumbosacral radiculopathy. The patient denies problems with double vision or loss of vision, but she will occasionally have some slurred speech and difficulty with swallowing. The patient is on low-dose aspirin. The patient is sent to this office for further evaluation. Blood work has revealed a sedimentation rate 55, but a C-reactive protein was  unremarkable. The vitamin B12 level was unremarkable. The patient has had a significant increase in fatigue over the last 2 months.  Past Medical History  Diagnosis Date  . Depression   . Hypertension   . Acid reflux   . Diverticular disease   . Rheumatoid arthritis(714.0)   . Osteoarthritis   . Bell's palsy     Left  . Hypercholesterolemia   . Myocardial infarction     ' i have been told I did "  . Diabetes mellitus     insulin dependent  . Anemia   . H/O hiatal hernia   . Stroke     mild memory loss  . Hepatitis     Hepatitis B  . Abnormality of gait 09/26/2013  . Memory deficit 09/26/2013    Past Surgical History  Procedure Laterality Date  . Appendectomy    . Tonsillectomy    . Cholecystectomy    . Abdominal hysterectomy    . Fracture surgery    . Orif tibia fracture  01/15/2012    tib /fib fracture  . Cataract extraction Bilateral   . Esophageal dilation      esophageal stricture    Family History  Problem Relation Age of Onset  . Diabetes type II    . Coronary artery disease    . Diabetes type II Mother   . Coronary artery disease Mother   . Stroke Father   . Cancer Brother   . Cancer Sister     stomach  . Cancer Sister     colon  . Diabetes type II Brother  Social history:  reports that she has quit smoking. She has never used smokeless tobacco. She reports that she drinks about 0.5 ounces of alcohol per week. She reports that she does not use illicit drugs.  Medications:  Current Outpatient Prescriptions on File Prior to Visit  Medication Sig Dispense Refill  . amLODipine (NORVASC) 5 MG tablet Take 5 mg by mouth daily.        . Ascorbic Acid (VITAMIN C) 1000 MG tablet Take 1,000 mg by mouth daily.        Marland Kitchen aspirin 81 MG tablet Take 81 mg by mouth daily.        . Calcium Citrate-Vitamin D (CITRACAL + D PO) Take 1 tablet by mouth 2 (two) times daily.      . cholecalciferol (VITAMIN D) 1000 UNITS tablet Take 1,000 Units by mouth daily.        .  DULoxetine (CYMBALTA) 60 MG capsule Take 60 mg by mouth daily.      . insulin glargine (LANTUS) 100 UNIT/ML injection Inject 50 Units into the skin at bedtime.      . insulin lispro (HUMALOG) 100 UNIT/ML injection Inject 30 Units into the skin 3 (three) times daily before meals.      Marland Kitchen lisinopril (PRINIVIL,ZESTRIL) 10 MG tablet Take 1 tablet (10 mg total) by mouth daily.  90 tablet  0  . meloxicam (MOBIC) 15 MG tablet Take 15 mg by mouth daily.        . pantoprazole (PROTONIX) 40 MG tablet Take 40 mg by mouth daily.        . simvastatin (ZOCOR) 40 MG tablet Take 40 mg by mouth daily.         No current facility-administered medications on file prior to visit.      Allergies  Allergen Reactions  . Prednisone Other (See Comments)    Causes stroke  . Cortisone Other (See Comments)    Causes stroke    ROS:  Out of a complete 14 system review of symptoms, the patient complains only of the following symptoms, and all other reviewed systems are negative.  Gait disorder Neck pain, low back pain Urinary urgency, frequency Memory disorder Fatigue  Blood pressure 138/71, pulse 71, height 5' (1.524 m), weight 130 lb (58.968 kg).  Physical Exam  General: The patient is alert and cooperative at the time of the examination.  Eyes: Pupils are equal, round, and reactive to light. Discs are flat bilaterally.  Neck: The neck is supple, no carotid bruits are noted.  Respiratory: The respiratory examination is clear.  Cardiovascular: The cardiovascular examination reveals a regular rate and rhythm, no obvious murmurs or rubs are noted.  Neuromuscular: Range of movement of the cervical spine lacks about 15 of full lateral rotation bilaterally.  Skin: Extremities are without significant edema.  Neurologic Exam  Mental status: The Mini-Mental status examination done today shows a total score 26/30.  Cranial nerves: Facial symmetry is not present. When smiling, there is partial closure of  the left eye. There is good sensation of the face to pinprick and soft touch bilaterally. The strength of the facial muscles and the muscles to head turning and shoulder shrug are normal bilaterally. Speech is well enunciated, no aphasia or dysarthria is noted. Extraocular movements are full. Visual fields are full. The tongue is midline, and the patient has symmetric elevation of the soft palate. No obvious hearing deficits are noted.  Motor: The motor testing reveals 5 over 5 strength of all  4 extremities, with the exception that the patient has mild proximal weakness of both legs. Good symmetric motor tone is noted throughout.  Sensory: Sensory testing is intact to pinprick, soft touch, vibration sensation, and position sense on all 4 extremities. No evidence of extinction is noted.  Coordination: Cerebellar testing reveals good finger-nose-finger and heel-to-shin bilaterally.  Gait and station: The patient is able to arise from a seated position with arms crossed with some difficulty. Once up, the patient can ambulate independently, there is some decreased arm swing on the right, and the patient has a slight circumduction type gait with the right leg. The patient has decreased arm swing on the left side. Gait is minimally wide-based. The patient is unable to perform tandem gait. The patient normally walks with a cane. Romberg is negative. No drift is seen.  Reflexes: Deep tendon reflexes are symmetric, but the knee jerk reflexes are slightly brisk bilaterally, ankle jerk reflexes are depressed but present. Toes are downgoing bilaterally.   CT head 02/25/2013:  IMPRESSION:  No acute intracranial abnormality.  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    Assessment/Plan:  One. Gait disorder  2. Diabetes  3. Memory disorder  4. Fatigue  The patient has had some progression of memory problems, urinary urgency and frequency, and  a gait disorder over the last months to years. The patient is fallen while using a cane. The patient will be sent for physical therapy for gait training. The patient will be set up for MRI evaluation of the brain, as she has multiple risk factors for cerebrovascular disease. If this study does not explain her gait disorder, the patient will be sent for MRI of the cervical spine to exclude the possibility of a cervical myelopathy. Knee jerk reflexes are slightly brisk. The patient followup in 3-4 months. Some blood work will be done today.  Marlan Palau MD 09/26/2013 7:44 PM  [facility] [address] [place], [place] [address]  Phone [phone] Fax [phone]

## 2013-09-26 NOTE — Patient Instructions (Signed)

## 2013-09-28 LAB — SYPHILIS: RPR W/REFLEX TO RPR TITER AND TREPONEMAL ANTIBODIES, TRADITIONAL SCREENING AND DIAGNOSIS ALGORITHM: RPR: NONREACTIVE

## 2013-09-28 LAB — SEDIMENTATION RATE: SED RATE: 9 mm/h (ref 0–40)

## 2013-09-28 LAB — COPPER, SERUM: COPPER: 101 ug/dL (ref 72–166)

## 2013-09-30 ENCOUNTER — Ambulatory Visit
Admission: RE | Admit: 2013-09-30 | Discharge: 2013-09-30 | Disposition: A | Discharge status: Home / Self Care | Payer: Medicare HMO | Source: Ambulatory Visit | Attending: Neurology | Admitting: Neurology

## 2013-09-30 DIAGNOSIS — R269 Unspecified abnormalities of gait and mobility: Secondary | ICD-10-CM

## 2013-09-30 DIAGNOSIS — R413 Other amnesia: Secondary | ICD-10-CM

## 2013-10-03 ENCOUNTER — Telehealth: Payer: Self-pay | Admitting: Neurology

## 2013-10-03 DIAGNOSIS — R269 Unspecified abnormalities of gait and mobility: Secondary | ICD-10-CM

## 2013-10-03 NOTE — Telephone Encounter (Signed)
I called patient. MRI study of the brain showed no changes from November 2011. The patient does have some small vessel disease. I will obtain MRI evaluation of the neck as we had discussed with the patient on her visit.   MRI brain 10/03/2013:  IMPRESSION: Abnormal MRI scan of the brain showing a remote age bilateral basal ganglia infarcts and mild changes of chronic microvascular ischemia. Overall no significant changes compared with MRI scan dated 06/25/2010.

## 2013-10-14 ENCOUNTER — Telehealth: Payer: Self-pay | Admitting: Neurology

## 2013-10-14 NOTE — Telephone Encounter (Signed)
Called patient and she stated that she has already taken care of the exercise program and if she needs [place] she will give [place] a call back.

## 2013-10-14 NOTE — Telephone Encounter (Signed)
Patient calling about the exercises she was told to do, was wondering if she can take her card to the [facility] so that Medicare can take care of the cost for her. Please call patient and advise.

## 2013-10-26 ENCOUNTER — Ambulatory Visit
Admission: RE | Admit: 2013-10-26 | Discharge: 2013-10-26 | Disposition: A | Discharge status: Home / Self Care | Payer: Commercial Managed Care - HMO | Source: Ambulatory Visit | Attending: Neurology | Admitting: Neurology

## 2013-10-26 DIAGNOSIS — R269 Unspecified abnormalities of gait and mobility: Secondary | ICD-10-CM

## 2013-10-27 ENCOUNTER — Telehealth: Payer: Self-pay | Admitting: Neurology

## 2013-10-27 NOTE — Telephone Encounter (Signed)
I called the patient. The cervical spine MRI did show some disc bulges, and some mild spinal stenosis but no evidence of cord compression note explain her gait issue. MRI the brain was stable, does show some small vessel disease. The nerve conduction studies do not show evidence of a significant peripheral neuropathy. Etiology of the gait disturbance is not clear. Blood work has been unremarkable. The patient will get into balance training with silver sneakers. The patient will followup through this office in the future.   MRI cervical spine 10/27/2013:  Impression   Abnormal MRI cervical spine (without) demonstrating: 1. At C4-5: disc bulging, uncovertebral joint hypertrophy with mild spinal  stenosis and moderate biforaminal foraminal stenosis; no cord signal  abnormalites. 2. At C3-4: disc bulging, uncovertebral joint hypertrophy with moderate  biforaminal foraminal stenosis. 3. Disc bulging and spondylosis, with anterior bone bridging from C4-5 to  C7-T1.

## 2013-12-09 ENCOUNTER — Emergency Department (INDEPENDENT_AMBULATORY_CARE_PROVIDER_SITE_OTHER): Payer: Medicare Other

## 2013-12-09 ENCOUNTER — Emergency Department (HOSPITAL_COMMUNITY)
Admission: EM | Admit: 2013-12-09 | Discharge: 2013-12-09 | Disposition: A | Discharge status: Home / Self Care | Payer: Commercial Managed Care - HMO | Source: Home / Self Care | Attending: Family Medicine | Admitting: Family Medicine

## 2013-12-09 ENCOUNTER — Encounter (HOSPITAL_COMMUNITY): Payer: Self-pay | Admitting: Emergency Medicine

## 2013-12-09 DIAGNOSIS — S298XXA Other specified injuries of thorax, initial encounter: Secondary | ICD-10-CM

## 2013-12-09 DIAGNOSIS — S299XXA Unspecified injury of thorax, initial encounter: Secondary | ICD-10-CM

## 2013-12-09 MED ORDER — HYDROCODONE-ACETAMINOPHEN 5-325 MG PO TABS: 1.0000 | ORAL_TABLET | Freq: Four times a day (QID) | ORAL | Status: DC | PRN
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
## 2013-12-15 ENCOUNTER — Other Ambulatory Visit: Payer: Self-pay | Admitting: Internal Medicine

## 2013-12-21 ENCOUNTER — Ambulatory Visit
Admission: RE | Admit: 2013-12-21 | Discharge: 2013-12-21 | Disposition: A | Discharge status: Home / Self Care | Payer: Commercial Managed Care - HMO | Source: Ambulatory Visit | Attending: Internal Medicine | Admitting: Internal Medicine

## 2013-12-21 MED ORDER — IOHEXOL 300 MG/ML  SOLN
75.0000 mL | Freq: Once | INTRAMUSCULAR | Status: AC | PRN
  Administered 2013-12-21: 75 mL via INTRAVENOUS

## 2014-03-21 ENCOUNTER — Ambulatory Visit (INDEPENDENT_AMBULATORY_CARE_PROVIDER_SITE_OTHER): Payer: Commercial Managed Care - HMO | Admitting: Neurology

## 2014-03-21 ENCOUNTER — Encounter: Payer: Self-pay | Admitting: Neurology

## 2014-03-21 VITALS — BP 157/79 | HR 74 | Wt 125.0 lb

## 2014-03-21 DIAGNOSIS — R269 Unspecified abnormalities of gait and mobility: Secondary | ICD-10-CM

## 2014-03-21 DIAGNOSIS — R413 Other amnesia: Secondary | ICD-10-CM

## 2014-03-21 NOTE — Patient Instructions (Signed)
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
## 2014-03-22 ENCOUNTER — Telehealth: Payer: Self-pay | Admitting: Adult Health

## 2014-03-22 NOTE — Telephone Encounter (Signed)
Kim with Dr. Lanell Matar office @ [phone], requesting an earlier appointment with [facility] due to increasing falls and weakness.  Please call and advise.  Thanks

## 2014-03-24 ENCOUNTER — Ambulatory Visit: Payer: Medicare HMO | Attending: Neurology | Admitting: Rehabilitative and Restorative Service Providers"

## 2014-03-24 DIAGNOSIS — K219 Gastro-esophageal reflux disease without esophagitis: Secondary | ICD-10-CM | POA: Diagnosis not present

## 2014-03-24 DIAGNOSIS — E119 Type 2 diabetes mellitus without complications: Secondary | ICD-10-CM | POA: Insufficient documentation

## 2014-03-24 DIAGNOSIS — R413 Other amnesia: Secondary | ICD-10-CM | POA: Insufficient documentation

## 2014-03-24 DIAGNOSIS — M6281 Muscle weakness (generalized): Secondary | ICD-10-CM | POA: Diagnosis not present

## 2014-03-24 DIAGNOSIS — F3289 Other specified depressive episodes: Secondary | ICD-10-CM | POA: Diagnosis not present

## 2014-03-24 DIAGNOSIS — M069 Rheumatoid arthritis, unspecified: Secondary | ICD-10-CM | POA: Diagnosis not present

## 2014-03-24 DIAGNOSIS — IMO0001 Reserved for inherently not codable concepts without codable children: Secondary | ICD-10-CM | POA: Diagnosis not present

## 2014-03-24 DIAGNOSIS — R269 Unspecified abnormalities of gait and mobility: Secondary | ICD-10-CM | POA: Insufficient documentation

## 2014-03-24 DIAGNOSIS — Z8673 Personal history of transient ischemic attack (TIA), and cerebral infarction without residual deficits: Secondary | ICD-10-CM | POA: Insufficient documentation

## 2014-03-24 DIAGNOSIS — F329 Major depressive disorder, single episode, unspecified: Secondary | ICD-10-CM | POA: Diagnosis not present

## 2014-03-26 NOTE — Telephone Encounter (Signed)
I have never seen this patient. Looks like Dr. Anne Hahn just saw her. We should seek his opinion regarding an earlier appointment. I am happy to see her sooner if needed.

## 2014-03-27 NOTE — Telephone Encounter (Signed)
This must have been a "crossed message" I just saw her last week. The message from Dr. Lanell Matar office came in within 24 hours after I saw the patient.

## 2014-03-29 ENCOUNTER — Ambulatory Visit: Payer: Medicare HMO | Attending: Neurology | Admitting: Physical Therapy

## 2014-03-29 DIAGNOSIS — R269 Unspecified abnormalities of gait and mobility: Secondary | ICD-10-CM | POA: Insufficient documentation

## 2014-03-29 DIAGNOSIS — F3289 Other specified depressive episodes: Secondary | ICD-10-CM | POA: Insufficient documentation

## 2014-03-29 DIAGNOSIS — M6281 Muscle weakness (generalized): Secondary | ICD-10-CM | POA: Insufficient documentation

## 2014-03-29 DIAGNOSIS — E119 Type 2 diabetes mellitus without complications: Secondary | ICD-10-CM | POA: Diagnosis not present

## 2014-03-29 DIAGNOSIS — Z8673 Personal history of transient ischemic attack (TIA), and cerebral infarction without residual deficits: Secondary | ICD-10-CM | POA: Diagnosis not present

## 2014-03-29 DIAGNOSIS — R413 Other amnesia: Secondary | ICD-10-CM | POA: Diagnosis not present

## 2014-03-29 DIAGNOSIS — F329 Major depressive disorder, single episode, unspecified: Secondary | ICD-10-CM | POA: Diagnosis not present

## 2014-03-29 DIAGNOSIS — IMO0001 Reserved for inherently not codable concepts without codable children: Secondary | ICD-10-CM | POA: Diagnosis present

## 2014-03-29 DIAGNOSIS — M069 Rheumatoid arthritis, unspecified: Secondary | ICD-10-CM | POA: Insufficient documentation

## 2014-03-29 DIAGNOSIS — K219 Gastro-esophageal reflux disease without esophagitis: Secondary | ICD-10-CM | POA: Insufficient documentation

## 2014-03-30 NOTE — Telephone Encounter (Signed)
Noted  

## 2014-03-31 ENCOUNTER — Ambulatory Visit: Payer: Medicare HMO | Admitting: Physical Therapy

## 2014-04-04 ENCOUNTER — Ambulatory Visit: Payer: Medicare HMO | Admitting: Rehabilitative and Restorative Service Providers"

## 2014-04-04 DIAGNOSIS — IMO0001 Reserved for inherently not codable concepts without codable children: Secondary | ICD-10-CM | POA: Diagnosis not present

## 2014-04-07 ENCOUNTER — Ambulatory Visit: Payer: Medicare HMO

## 2014-04-07 DIAGNOSIS — IMO0001 Reserved for inherently not codable concepts without codable children: Secondary | ICD-10-CM | POA: Diagnosis not present

## 2014-04-10 ENCOUNTER — Ambulatory Visit: Payer: Medicare HMO | Admitting: Physical Therapy

## 2014-04-10 DIAGNOSIS — IMO0001 Reserved for inherently not codable concepts without codable children: Secondary | ICD-10-CM | POA: Diagnosis not present

## 2014-04-13 ENCOUNTER — Ambulatory Visit: Payer: Medicare HMO | Admitting: Physical Therapy

## 2014-04-13 DIAGNOSIS — IMO0001 Reserved for inherently not codable concepts without codable children: Secondary | ICD-10-CM | POA: Diagnosis not present

## 2014-04-18 ENCOUNTER — Ambulatory Visit: Payer: Medicare HMO

## 2014-04-18 DIAGNOSIS — IMO0001 Reserved for inherently not codable concepts without codable children: Secondary | ICD-10-CM | POA: Diagnosis not present

## 2014-04-19 ENCOUNTER — Encounter: Payer: Commercial Managed Care - HMO | Admitting: Rehabilitative and Restorative Service Providers"

## 2014-04-21 ENCOUNTER — Ambulatory Visit: Payer: Medicare HMO | Admitting: Rehabilitative and Restorative Service Providers"

## 2014-04-21 DIAGNOSIS — IMO0001 Reserved for inherently not codable concepts without codable children: Secondary | ICD-10-CM | POA: Diagnosis not present

## 2014-04-25 ENCOUNTER — Ambulatory Visit: Payer: Medicare HMO | Attending: Neurology | Admitting: Rehabilitative and Restorative Service Providers"

## 2014-04-25 DIAGNOSIS — M069 Rheumatoid arthritis, unspecified: Secondary | ICD-10-CM | POA: Diagnosis not present

## 2014-04-25 DIAGNOSIS — K219 Gastro-esophageal reflux disease without esophagitis: Secondary | ICD-10-CM | POA: Insufficient documentation

## 2014-04-25 DIAGNOSIS — E119 Type 2 diabetes mellitus without complications: Secondary | ICD-10-CM | POA: Diagnosis not present

## 2014-04-25 DIAGNOSIS — R413 Other amnesia: Secondary | ICD-10-CM | POA: Diagnosis not present

## 2014-04-25 DIAGNOSIS — F329 Major depressive disorder, single episode, unspecified: Secondary | ICD-10-CM | POA: Diagnosis not present

## 2014-04-25 DIAGNOSIS — R269 Unspecified abnormalities of gait and mobility: Secondary | ICD-10-CM | POA: Insufficient documentation

## 2014-04-25 DIAGNOSIS — IMO0001 Reserved for inherently not codable concepts without codable children: Secondary | ICD-10-CM | POA: Insufficient documentation

## 2014-04-25 DIAGNOSIS — F3289 Other specified depressive episodes: Secondary | ICD-10-CM | POA: Insufficient documentation

## 2014-04-25 DIAGNOSIS — M6281 Muscle weakness (generalized): Secondary | ICD-10-CM | POA: Insufficient documentation

## 2014-04-25 DIAGNOSIS — Z8673 Personal history of transient ischemic attack (TIA), and cerebral infarction without residual deficits: Secondary | ICD-10-CM | POA: Insufficient documentation

## 2014-04-27 ENCOUNTER — Ambulatory Visit: Payer: Medicare HMO | Admitting: Physical Therapy

## 2014-04-27 DIAGNOSIS — IMO0001 Reserved for inherently not codable concepts without codable children: Secondary | ICD-10-CM | POA: Diagnosis not present

## 2014-07-12 ENCOUNTER — Encounter: Payer: Self-pay | Admitting: Neurology

## 2014-07-18 ENCOUNTER — Encounter: Payer: Self-pay | Admitting: Neurology

## 2014-07-27 ENCOUNTER — Ambulatory Visit: Payer: Commercial Managed Care - HMO | Admitting: Adult Health

## 2014-07-31 ENCOUNTER — Encounter: Payer: Self-pay | Admitting: Adult Health

## 2014-07-31 ENCOUNTER — Ambulatory Visit (INDEPENDENT_AMBULATORY_CARE_PROVIDER_SITE_OTHER): Payer: Commercial Managed Care - HMO | Admitting: Adult Health

## 2014-07-31 VITALS — BP 153/80 | HR 88 | Temp 96.8°F | Ht 60.0 in | Wt 124.0 lb

## 2014-07-31 DIAGNOSIS — R296 Repeated falls: Secondary | ICD-10-CM

## 2014-07-31 DIAGNOSIS — R269 Unspecified abnormalities of gait and mobility: Secondary | ICD-10-CM

## 2014-07-31 DIAGNOSIS — R413 Other amnesia: Secondary | ICD-10-CM

## 2014-07-31 DIAGNOSIS — M545 Low back pain, unspecified: Secondary | ICD-10-CM

## 2014-07-31 NOTE — Progress Notes (Signed)
I have read the note, and I agree with the clinical assessment and plan.  WILLIS,CHARLES KEITH   
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
## 2014-07-31 NOTE — Patient Instructions (Addendum)

## 2014-09-21 ENCOUNTER — Ambulatory Visit
Admission: RE | Admit: 2014-09-21 | Discharge: 2014-09-21 | Disposition: A | Discharge status: Home / Self Care | Payer: Commercial Managed Care - HMO | Source: Ambulatory Visit | Attending: Adult Health | Admitting: Adult Health

## 2014-09-21 DIAGNOSIS — R269 Unspecified abnormalities of gait and mobility: Secondary | ICD-10-CM

## 2014-09-21 DIAGNOSIS — M545 Low back pain, unspecified: Secondary | ICD-10-CM

## 2014-09-21 MED ORDER — GADOBENATE DIMEGLUMINE 529 MG/ML IV SOLN
10.0000 mL | Freq: Once | INTRAVENOUS | Status: AC | PRN
  Administered 2014-09-21: 10 mL via INTRAVENOUS

## 2014-09-25 ENCOUNTER — Telehealth: Payer: Self-pay | Admitting: Adult Health

## 2014-09-25 NOTE — Telephone Encounter (Signed)
I called the patient and reviewed her MRI of the lumbar spine. She has some disc bulging and arthritic changes. Surgery is not warranted at this time.  Patient continues to do her exercises from physical therapy at home. She would like to try different pain medication to help. I recommended tramadol but explained the risk of serotonin syndrome when taking Cymbalta and tramadol. She states that she will just continue taking tylenol and will wean off the Cymbalta. She does not feel that she needs it now. I advised her to call her PCP and discuss that with him since he is prescribing the Cymbalta.

## 2014-11-28 ENCOUNTER — Other Ambulatory Visit: Payer: Self-pay

## 2014-11-28 NOTE — Patient Outreach (Signed)
Patient reports having a good day today.  Denies any complaints of pain or discomfort.  Patient agrees with plan of care. Patient agrees to working on goals not met in plan of care.   Patient agrees to next scheduled call.    Maury Dus, RN, Ishmael Holter, [facility] Telephonic Care Coordinator [phone]

## 2014-11-28 NOTE — Patient Instructions (Signed)
Diabetes and Sick Day Management Blood sugar (glucose) can be more difficult to control when you are sick. Colds, fever, flu, nausea, vomiting, and diarrhea are all examples of common illnesses that can cause problems for people with diabetes. Loss of body fluids (dehydration) from fever, vomiting, diarrhea, infection, and the stress of a sickness can all cause blood glucose levels to increase. Because of this, it is very important to take your diabetes medicines and to eat some form of carbohydrate food when you are sick. Liquid or soft foods are often tolerated, and they help to replace fluids. HOME CARE INSTRUCTIONS These main guidelines are intended for managing a short-term (24 hours or less) sickness:  Take your usual dose of insulin or oral diabetes medicine. An exception would be if you take any form of metformin. If you cannot eat or drink, you can become dehydrated and should not take this medicine.  Continue to take your insulin even if you are unable to eat solid foods or are vomiting. Your insulin dose may stay the same, or it may need to be increased when you are sick.  You will need to test your blood glucose more often, generally every 2-4 hours. If you have type 1 diabetes, test your urine for ketones every 4 hours. If you have type 2 diabetes, test your urine for ketones as directed by your health care provider.  Eat some form of food that contains carbohydrates. The carbohydrates can be in solid or liquid form. You should eat 45-50 g of carbohydrates every 3-4 hours.  Replace fluids if you have a fever, vomit, or have diarrhea. Ask your health care provider for specific rehydration instructions.  Watch carefully for the signs of ketoacidosis if you have type 1 diabetes. Call your health care provider if any of the following symptoms are present, especially in children:  Moderate to large ketones in the urine along with a high blood glucose level.  Severe  nausea.  Vomiting.  Diarrhea.  Abdominal pain.  Rapid breathing.  Drink extra liquids that do not contain sugar such as water.  Be careful with over-the-counter medicines. Read the labels. They may contain sugar or types of sugars that can increase your blood glucose level. Food Choices for Illness All of the food choices below contain about 15 g of carbohydrates. Plan ahead and keep some of these foods around.    to  cup carbonated beverage containing sugar. Carbonated beverages will usually be better tolerated if they are opened and left at room temperature for a few minutes.   of a twin frozen ice pop.   cup regular gelatin.   cup juice.   cup ice cream or frozen yogurt.   cup cooked cereal.   cup sherbet.  1 cup clear broth or soup.  1 cup cream soup.   cup regular custard.   cup regular pudding.  1 cup sports drink.  1 cup plain yogurt.  1 slice toast.  6 squares saltine crackers.  5 vanilla wafers. SEEK MEDICAL CARE IF:   You are unable to drink fluids, even small amounts.  You have nausea and vomiting for more than 6 hours.  You have diarrhea for more than 6 hours.  Your blood glucose level is more than 240 mg/dL, even with additional insulin.  There is a change in mental status.  You develop an additional serious sickness.  You have been sick for 2 days and are not getting better.  You have a fever. SEEK IMMEDIATE   MEDICAL CARE IF:  You have difficulty breathing.  You have moderate to large ketone levels. MAKE SURE YOU:  Understand these instructions.  Will watch your condition.  Will get help right away if you are not doing well or get worse. Document Released: 08/14/2003 Document Revised: 12/26/2013 Document Reviewed: 01/18/2013 ExitCare Patient Information 2015 [employer]. This information is not intended to replace advice given to you by your health care provider. Make sure you discuss any questions you have with  your health care provider.  

## 2014-11-30 ENCOUNTER — Ambulatory Visit: Payer: Commercial Managed Care - HMO | Admitting: Adult Health

## 2014-12-28 ENCOUNTER — Other Ambulatory Visit: Payer: Self-pay

## 2014-12-28 NOTE — Patient Outreach (Signed)
[facility]) Care Management  12/28/2014  Tracey Mason 1946-07-14 [id number]   RN CM called patient for monthly follow up.  RN CM was unable to reach patient.  RN CM will try to reach patient again at a later date.   RN CM educating patient on disease management of her diabetes.   Will continue with care plan related to self-management of her diabetes.  [name], RN, [name], [name] Telephonic Care Coordinator [phone]

## 2014-12-29 ENCOUNTER — Other Ambulatory Visit: Payer: Self-pay

## 2014-12-29 NOTE — Patient Outreach (Signed)
[facility]) Care Management  12/29/2014  Tracey Mason [birth year] [id number]   RN CM attempted 2nd outreach call to patient for monthly follow up for disease management.  Patient was unavailable. RN CM will review care plans with patient at next call.  RN CM will try again at a later date.    [name], RN, [name], [facility] Telephonic Care Coordinator [phone]

## 2015-01-01 ENCOUNTER — Other Ambulatory Visit: Payer: Self-pay

## 2015-01-01 NOTE — Patient Outreach (Signed)
[facility]) Care Management  01/01/2015  Tracey Mason [birth year] [id number]   RN CM has made 3 unsuccessful outreach calls to patient for disease management.  Unable to reach patient or leave a message.  RN CM will close this case due to unable to contact and/or maintain contact with this patient.  RN CM will send a closure letter to patient and patient's primary doctor.  [name], RN, [name], [facility] Telephonic Care Coordinator [phone]

## 2015-01-01 NOTE — Patient Outreach (Signed)
[facility]) Care Management  01/01/2015  Tracey Mason 1946-01-07 [id number]   Received notification from [name], RN to close case due to unable to establish/maintain contact with patient.  Corrie Mckusick. [facility] Care Management [facility] CM Assistant Phone: [phone] Fax: [phone]

## 2015-01-26 ENCOUNTER — Encounter (HOSPITAL_COMMUNITY): Payer: Self-pay | Admitting: Emergency Medicine

## 2015-01-26 ENCOUNTER — Emergency Department (HOSPITAL_COMMUNITY): Payer: Commercial Managed Care - HMO

## 2015-01-26 ENCOUNTER — Emergency Department (HOSPITAL_COMMUNITY)
Admission: EM | Admit: 2015-01-26 | Discharge: 2015-01-26 | Disposition: A | Discharge status: Home / Self Care | Payer: Commercial Managed Care - HMO | Attending: Emergency Medicine | Admitting: Emergency Medicine

## 2015-01-26 DIAGNOSIS — Z791 Long term (current) use of non-steroidal anti-inflammatories (NSAID): Secondary | ICD-10-CM | POA: Insufficient documentation

## 2015-01-26 DIAGNOSIS — M1990 Unspecified osteoarthritis, unspecified site: Secondary | ICD-10-CM | POA: Diagnosis not present

## 2015-01-26 DIAGNOSIS — S82002A Unspecified fracture of left patella, initial encounter for closed fracture: Secondary | ICD-10-CM

## 2015-01-26 DIAGNOSIS — F329 Major depressive disorder, single episode, unspecified: Secondary | ICD-10-CM | POA: Insufficient documentation

## 2015-01-26 DIAGNOSIS — K219 Gastro-esophageal reflux disease without esophagitis: Secondary | ICD-10-CM | POA: Insufficient documentation

## 2015-01-26 DIAGNOSIS — Z8669 Personal history of other diseases of the nervous system and sense organs: Secondary | ICD-10-CM | POA: Insufficient documentation

## 2015-01-26 DIAGNOSIS — Y9301 Activity, walking, marching and hiking: Secondary | ICD-10-CM | POA: Diagnosis not present

## 2015-01-26 DIAGNOSIS — Z7982 Long term (current) use of aspirin: Secondary | ICD-10-CM | POA: Insufficient documentation

## 2015-01-26 DIAGNOSIS — Z79899 Other long term (current) drug therapy: Secondary | ICD-10-CM | POA: Insufficient documentation

## 2015-01-26 DIAGNOSIS — Z794 Long term (current) use of insulin: Secondary | ICD-10-CM | POA: Diagnosis not present

## 2015-01-26 DIAGNOSIS — S82042A Displaced comminuted fracture of left patella, initial encounter for closed fracture: Secondary | ICD-10-CM | POA: Insufficient documentation

## 2015-01-26 DIAGNOSIS — Y998 Other external cause status: Secondary | ICD-10-CM | POA: Insufficient documentation

## 2015-01-26 DIAGNOSIS — S42255A Nondisplaced fracture of greater tuberosity of left humerus, initial encounter for closed fracture: Secondary | ICD-10-CM | POA: Diagnosis not present

## 2015-01-26 DIAGNOSIS — Z862 Personal history of diseases of the blood and blood-forming organs and certain disorders involving the immune mechanism: Secondary | ICD-10-CM | POA: Diagnosis not present

## 2015-01-26 DIAGNOSIS — S42302A Unspecified fracture of shaft of humerus, left arm, initial encounter for closed fracture: Secondary | ICD-10-CM

## 2015-01-26 DIAGNOSIS — S4992XA Unspecified injury of left shoulder and upper arm, initial encounter: Secondary | ICD-10-CM | POA: Diagnosis present

## 2015-01-26 DIAGNOSIS — E119 Type 2 diabetes mellitus without complications: Secondary | ICD-10-CM | POA: Insufficient documentation

## 2015-01-26 DIAGNOSIS — W1839XA Other fall on same level, initial encounter: Secondary | ICD-10-CM | POA: Diagnosis not present

## 2015-01-26 DIAGNOSIS — Z87891 Personal history of nicotine dependence: Secondary | ICD-10-CM | POA: Diagnosis not present

## 2015-01-26 DIAGNOSIS — Z8673 Personal history of transient ischemic attack (TIA), and cerebral infarction without residual deficits: Secondary | ICD-10-CM | POA: Insufficient documentation

## 2015-01-26 DIAGNOSIS — W19XXXA Unspecified fall, initial encounter: Secondary | ICD-10-CM

## 2015-01-26 DIAGNOSIS — I252 Old myocardial infarction: Secondary | ICD-10-CM | POA: Insufficient documentation

## 2015-01-26 DIAGNOSIS — Y92512 Supermarket, store or market as the place of occurrence of the external cause: Secondary | ICD-10-CM | POA: Diagnosis not present

## 2015-01-26 MED ORDER — HYDROCODONE-ACETAMINOPHEN 5-325 MG PO TABS: 1.0000 | ORAL_TABLET | Freq: Four times a day (QID) | ORAL | Status: DC | PRN

## 2015-01-26 MED ORDER — MORPHINE SULFATE 4 MG/ML IJ SOLN
4.0000 mg | Freq: Once | INTRAMUSCULAR | Status: AC
  Administered 2015-01-26: 4 mg via INTRAVENOUS
  Filled 2015-01-26: qty 1

## 2015-01-26 NOTE — ED Notes (Signed)
69 yo patient present by private vehicle driven by Daughter as a result of a fall in grocery store at 2:15 pm today.  Pt has left leg and left arm pain reported at 10/10 pain and some left leg swelling.  Patient was pushing grocery cart when cart got away from patient and she fell per daughter. No LOC.

## 2015-01-26 NOTE — ED Notes (Signed)
Pt alert and oriented upon DC. She was wheeled to the vehicle and assisted in by [place] and [employer]. She was advised to follow up with orthopedics tomorrow. Daughter driving patient home.

## 2015-01-26 NOTE — Care Management Note (Addendum)
Case Management Note  Patient Details  Name: Tracey Mason MRN: [id number] Date of Birth: July 05, 1946  Subjective/Objective:     Patient presents to Ed post fall               Action/Plan: Discharge home with home health services   Expected Discharge Date:                  Expected Discharge Plan:  Home w Home Health Services  In-House Referral:     Discharge planning Services     Post Acute Care Choice:  Home Health Choice offered to:  Adult Children  DME Arranged:    DME Agency:     HH Arranged:  RN, PT, Nurse's Aide (social work) [employer] Agency:  [facility]  Status of Service:     Medicare Important Message Given:    Date Medicare IM Given:    Medicare IM give by:    Date Additional Medicare IM Given:    Additional Medicare Important Message give by:     If discussed at Long Length of Stay Meetings, dates discussed:    Additional Comments:  EDCM spoke to patient and her daughter at bedside.  [facility] asked patient if she was being discharged?  Patient stated, "I hope so."  Patient currently lives at home alone.  Patient's daughter [name] reports she will be staying with the patient for a few days.  EDCM asessed for equipment needs.  Patient reports she has a walker, wheelchair, "a regular cane" and a four prong cane at home.  Patient requesting bedside commode.  EDCM informed patient and [name] that it may take a few days to have bedside commode delivered to her home.  EDCM suggested [facility], [facility] to purchase bedside commode so that patient may have it sooner.  [name] reports she will ask her sister in law if she has a bedside commode they can borrow if not she will purchase at the store.  EDCM discussed home health services with patient and her daughter, explained services.  Patient and patient's daughter agreeable to services.  EDCM provided patient's daughter with list of home health agencies in [facility] of which [facility] was chosen as she has had [facility] in the  past.  Patient agreeable to [facility] consult.  [facility] consult placed.  EDCM advised patient to call her pcp on Monday.  Discussed patient with EDP and EDPA.  Orders placed for home health RN, PT, aide and social worker.  Discussed having family members come and stay with patient.  Patient stated, "i don't want the boys staying with me."  Patient's daughter responded, "you will take what you can get."  Confirmed patient address and phone number with patient and patient's daughter.  Patient and her daughter thankful for services. Patient and patient's daughter refused private duty nursing list as they cannot afford it.  No further EDCm needs at this time.  Bennie Dallas, Lenah Messenger, RN 01/26/2015, 9:59 PM

## 2015-01-26 NOTE — ED Notes (Signed)
Bed: WA01 Expected date:  Expected time:  Means of arrival:  Comments: Florentina Jenny

## 2015-01-26 NOTE — ED Provider Notes (Signed)
CSN: [id number]     Arrival date & time 01/26/15  1527 History   First MD Initiated Contact with Patient 01/26/15 1807     Chief Complaint  Patient presents with  . Fall     (Consider location/radiation/quality/duration/timing/severity/associated sxs/prior Treatment) HPI Tracey Mason is a 69 year old female with past medical history of depression, hypertension, osteoarthritis, rheumatoid arthritis, diabetes, CVA who presents the ER after a fall. Patient states she was walking in a grocery store when the grocery cart "slid out from under her". She reports falling on her left side. Patient reports pain diffusely along the left side of her body status post. Patient denies any head trauma. Patient reports immediate left shoulder pain, left hip and knee pain after. Patient denies hitting her head, loss of consciousness, neck pain, dizziness, weakness, vomiting, blurred vision, chest pain, shortness of breath, abdominal pain, fever, dysuria.  Past Medical History  Diagnosis Date  . Depression   . Hypertension   . Acid reflux   . Diverticular disease   . Rheumatoid arthritis(714.0)   . Osteoarthritis   . Bell's palsy     Left  . Hypercholesterolemia   . Myocardial infarction     ' i have been told I did "  . Diabetes mellitus     insulin dependent  . Anemia   . H/O hiatal hernia   . Stroke     mild memory loss  . Hepatitis     Hepatitis B  . Abnormality of gait 09/26/2013  . Memory deficit 09/26/2013   Past Surgical History  Procedure Laterality Date  . Appendectomy    . Tonsillectomy    . Cholecystectomy    . Abdominal hysterectomy    . Fracture surgery    . Orif tibia fracture  01/15/2012    tib /fib fracture  . Cataract extraction Bilateral   . Esophageal dilation      esophageal stricture   Family History  Problem Relation Age of Onset  . Diabetes type II    . Coronary artery disease    . Diabetes type II Mother   . Coronary artery disease Mother   . Stroke Father    . Cancer Brother   . Cancer Sister     stomach  . Cancer Sister     colon  . Diabetes type II Brother    History  Substance Use Topics  . Smoking status: Former Smoker    Types: Cigarettes    Quit date: 01/26/1995  . Smokeless tobacco: Never Used  . Alcohol Use: 0.5 oz/week    1 Standard drinks or equivalent per week     Comment: one a week   OB History    No data available     Review of Systems  Constitutional: Negative for fever.  HENT: Negative for trouble swallowing.   Eyes: Negative for visual disturbance.  Respiratory: Negative for shortness of breath.   Cardiovascular: Negative for chest pain.  Gastrointestinal: Negative for nausea, vomiting and abdominal pain.  Genitourinary: Negative for dysuria.  Musculoskeletal: Positive for arthralgias. Negative for neck pain.  Skin: Negative for rash.  Neurological: Negative for dizziness, syncope, weakness and numbness.  Psychiatric/Behavioral: Negative.       Allergies  Prednisone and Cortisone  Home Medications   Prior to Admission medications   Medication Sig Start Date End Date Taking? Authorizing Provider  Ascorbic Acid (VITAMIN C) 1000 MG tablet Take 1,000 mg by mouth daily.     Yes Historical Provider, MD  aspirin 81 MG tablet Take 81 mg by mouth daily.     Yes Historical Provider, MD  Calcium Citrate-Vitamin D (CITRACAL + D PO) Take 1 tablet by mouth 2 (two) times daily.   Yes Historical Provider, MD  DULoxetine (CYMBALTA) 60 MG capsule Take 60 mg by mouth daily.   Yes Historical Provider, MD  insulin glargine (LANTUS) 100 UNIT/ML injection Inject 50 Units into the skin at bedtime.   Yes Historical Provider, MD  insulin lispro (HUMALOG) 100 UNIT/ML injection Inject 30 Units into the skin 3 (three) times daily before meals.   Yes Historical Provider, MD  lisinopril (PRINIVIL,ZESTRIL) 10 MG tablet Take 1 tablet (10 mg total) by mouth daily. 02/27/13  Yes Drema Dallas, MD  loratadine (CLARITIN) 10 MG tablet Take  10 mg by mouth daily.   Yes Historical Provider, MD  meloxicam (MOBIC) 15 MG tablet Take 15 mg by mouth daily.     Yes Historical Provider, MD  pantoprazole (PROTONIX) 40 MG tablet Take 40 mg by mouth daily.     Yes Historical Provider, MD  sertraline (ZOLOFT) 50 MG tablet Take 50 mg by mouth daily.   Yes Geoffry Paradise, MD  HYDROcodone-acetaminophen (NORCO/VICODIN) 5-325 MG per tablet Take 1-2 tablets by mouth every 6 (six) hours as needed. 01/26/15   Ladona Mow, PA-C   BP 144/66 mmHg  Pulse 79  Temp(Src) 97.9 F (36.6 C) (Oral)  Resp 18  Ht 5' (1.524 m)  Wt 125 lb (56.7 kg)  BMI 24.41 kg/m2  SpO2 97% Physical Exam  Constitutional: She is oriented to person, place, and time. She appears well-developed and well-nourished. No distress.  HENT:  Head: Normocephalic and atraumatic.  Mouth/Throat: Oropharynx is clear and moist. No oropharyngeal exudate.  Eyes: Conjunctivae and EOM are normal. Pupils are equal, round, and reactive to light. Right eye exhibits no discharge. Left eye exhibits no discharge. No scleral icterus.  Neck: Normal range of motion and full passive range of motion without pain. Neck supple. No spinous process tenderness and no muscular tenderness present. No rigidity. No edema, no erythema and normal range of motion present. No Brudzinski's sign and no Kernig's sign noted.  Cardiovascular: Normal rate, regular rhythm, S1 normal, S2 normal and normal heart sounds.   No murmur heard. Pulmonary/Chest: Effort normal and breath sounds normal. No respiratory distress.  Abdominal: Soft. There is no tenderness.  Musculoskeletal: Normal range of motion. She exhibits no edema or tenderness.  Tenderness palpation of proximal humeral region. Range of motion shoulder limited due to pain. No obvious erythema, ecchymosis, deformity. Mild tenderness palpation of elbow. Full range of motion of elbow with mild pain. Radial pulse 2+. Distal sensation intact.  Tenderness palpation of lateral  left hip. Negative logroll. Full range of motion of hip without pain.  Tetanus palpation of anterior knee. Limited exam due to the amount of pain. DP pulse 2+. Distal sensation intact.  Neurological: She is alert and oriented to person, place, and time. No cranial nerve deficit. Coordination normal.  Skin: Skin is warm and dry. No rash noted. She is not diaphoretic.  Psychiatric: She has a normal mood and affect.  Nursing note and vitals reviewed.   ED Course  Procedures (including critical care time) Labs Review Labs Reviewed - No data to display  Imaging Review Dg Forearm Left  01/26/2015   CLINICAL DATA:  Left arm pain.  Fall at grocery store.  EXAM: LEFT FOREARM - 2 VIEW  COMPARISON:  None.  FINDINGS: No  acute bony abnormality. Specifically, no fracture, subluxation, or dislocation. Soft tissues are intact.  IMPRESSION: No acute bony abnormality.   Electronically Signed   By: Charlett Nose M.D.   On: 01/26/2015 17:10   Dg Wrist Complete Left  01/26/2015   CLINICAL DATA:  Fall on concrete.  EXAM: LEFT WRIST - COMPLETE 3+ VIEW  COMPARISON:  View  FINDINGS: No distal radius or ulnar fracture. Radiocarpal joint is intact. No carpal fracture. No soft tissue abnormality.  IMPRESSION: No fracture or dislocation.  Osteopenia   Electronically Signed   By: Genevive Bi M.D.   On: 01/26/2015 17:38   Dg Tibia/fibula Left  01/26/2015   CLINICAL DATA:  Fall at a grocery store.  Left leg pain.  EXAM: LEFT TIBIA AND FIBULA - 2 VIEW  COMPARISON:  01/15/2012  FINDINGS: Posttraumatic and postoperative changes within the distal left tibia and fibula. Old healed fractures. Fractures noted through the patella. Large associated joint effusion. No acute tibia or fibula abnormality.  IMPRESSION: Patella fractures with large joint effusion. See knee series for further discussion.   Electronically Signed   By: Charlett Nose M.D.   On: 01/26/2015 17:09   Dg Shoulder Left  01/26/2015   CLINICAL DATA:  Left shoulder  pain.  Fall today.  EXAM: LEFT SHOULDER - 2+ VIEW  COMPARISON:  None.  FINDINGS: A fracture the proximal humerus is confirmed. This involves a greater tubercle. The glenohumeral joint is located. The clavicle is intact. The left hemi thorax is clear.  IMPRESSION: Minimally displaced fracture involving the greater tubercle of the proximal humerus.   Electronically Signed   By: Marin Roberts M.D.   On: 01/26/2015 19:40   Dg Knee Complete 4 Views Left  01/26/2015   CLINICAL DATA:  69 year old female with lower extremity pain and swelling after a fall at the grocery store. Initial encounter.  EXAM: LEFT KNEE - COMPLETE 4+ VIEW  COMPARISON:  Left tib-fib series 5 11/12/2011.  FINDINGS: Comminuted patella fracture with mild distraction. Large volume hemarthrosis at the left knee. Underlying osteopenia. The distal left femur appears intact. The proximal tibia and fibula appear intact.  IMPRESSION: Comminuted mildly distracted patella fracture with hemarthrosis.   Electronically Signed   By: Odessa Fleming M.D.   On: 01/26/2015 17:09   Dg Humerus Left  01/26/2015   CLINICAL DATA:  Fall.  Left arm injury and pain.  EXAM: LEFT HUMERUS - 2+ VIEW  COMPARISON:  None.  FINDINGS: A subtle linear lucency is seen only on the AP projection, along the lateral aspect of the humeral neck near the base of the greater tuberosity. This is suspicious for a nondisplaced fracture. No distal humerus fracture identified.  IMPRESSION: Possible nondisplaced fracture of the proximal humeral neck. Dedicated left shoulder radiographs recommended for further evaluation.   Electronically Signed   By: Myles Rosenthal M.D.   On: 01/26/2015 17:12   Dg Foot Complete Left  01/26/2015   CLINICAL DATA:  Status post fall with generalize leg swelling, history previous ORIF of the distal tibia and fibula.  EXAM: LEFT FOOT - COMPLETE 3+ VIEW  COMPARISON:  Left foot series of Jan 15, 2012.  FINDINGS: The bones of the foot are osteopenic. There are post fixation  changes of of the medial malleolus and distal fibula with fixation to the talus. There is no acute fracture nor dislocation. There is no lytic or blastic lesion or periosteal reaction.  IMPRESSION: There is no acute bony abnormality of the left foot.  There are chronic fusion changes of the ankle.   Electronically Signed   By: David  [place] M.D.   On: 01/26/2015 17:13   Dg Hip Unilat With Pelvis 2-3 Views Left  01/26/2015   CLINICAL DATA:  Left-sided hip pain. Fall today. Initial encounter.  EXAM: LEFT HIP (WITH PELVIS) 2-3 VIEWS  COMPARISON:  None.  FINDINGS: There is no evidence of hip fracture or dislocation. There is no evidence of arthropathy or other focal bone abnormality.  IMPRESSION: Negative left hip radiographs.   Electronically Signed   By: Marin Roberts M.D.   On: 01/26/2015 19:38     EKG Interpretation None      MDM   Final diagnoses:  Fall  Humerus fracture, left, closed, initial encounter  Patellar fracture, left, closed, initial encounter    Patient status post mechanical fall, complaining of left-sided upper and lower extremity pain. Patient without loss of consciousness. Neurologic exam is benign.   Shoulder radiographs impression: Minimally displaced fracture involving the greater tubercle of the proximal humerus.  Knee radiographs with impression: Comminuted mildly distracted patella fracture with hemarthrosis.  Other radiographs unremarkable for acute pathology. Patient placed in left arm sling, left knee immobilizer. Daughter voices concern about patient's mobility at home as patient has decreased mobility to begin with. Spoke with patient about admission versus discharge home for PT/OT consult, patient states she preferred to have her daughter come home with her and stay at her house versus staying in the hospital. Daughter is in agreement of this. Social work involved, Child psychotherapist advised she has arranged home health care for patient who will get in contact  with him in the next 24 hours. Patient states she has several types of walkers, a wheelchair as well as a 4 prong cane at home to help with mobility. Patient neurovascularly intact. Pain controlled here, we'll provide symptomatic therapy and strongly encouraged follow-up with orthopedics. Discussed return precautions with patient and her daughter including worsening of symptoms, worsening of mobility issues and they verbalized understanding and agreement of this plan.  BP 144/66 mmHg  Pulse 79  Temp(Src) 97.9 F (36.6 C) (Oral)  Resp 18  Ht 5' (1.524 m)  Wt 125 lb (56.7 kg)  BMI 24.41 kg/m2  SpO2 97%  Signed,  Ladona Mow, PA-C 2:37 AM  Patient seen and discussed Dr. Rolan Bucco, MD  Ladona Mow, PA-C 01/27/15 8333  Rolan Bucco, MD 01/27/15 1421

## 2015-01-26 NOTE — Discharge Instructions (Signed)
Humerus Fracture, Treated with Immobilization The humerus is the large bone in your upper arm. You have a broken (fractured) humerus. These fractures are easily diagnosed with X-rays. TREATMENT  Simple fractures which will heal without disability are treated with simple immobilization. Immobilization means you will wear a cast, splint, or sling. You have a fracture which will do well with immobilization. The fracture will heal well simply by being held in a good position until it is stable enough to begin range of motion exercises. Do not take part in activities which would further injure your arm.  HOME CARE INSTRUCTIONS   Put ice on the injured area.  Put ice in a plastic bag.  Place a towel between your skin and the bag.  Leave the ice on for 15-20 minutes, 03-04 times a day.  If you have a cast:  Do not scratch the skin under the cast using sharp or pointed objects.  Check the skin around the cast every day. You may put lotion on any red or sore areas.  Keep your cast dry and clean.  If you have a splint:  Wear the splint as directed.  Keep your splint dry and clean.  You may loosen the elastic around the splint if your fingers become numb, tingle, or turn cold or blue.  If you have a sling:  Wear the sling as directed.  Do not put pressure on any part of your cast or splint until it is fully hardened.  Your cast or splint can be protected during bathing with a plastic bag. Do not lower the cast or splint into water.  Only take over-the-counter or prescription medicines for pain, discomfort, or fever as directed by your caregiver.  Do range of motion exercises as instructed by your caregiver.  Follow up as directed by your caregiver. This is very important in order to avoid permanent injury or disability and chronic pain. SEEK IMMEDIATE MEDICAL CARE IF:   Your skin or nails in the injured arm turn blue or gray.  Your arm feels cold or numb.  You develop severe  pain in the injured arm.  You are having problems with the medicines you were given. MAKE SURE YOU:   Understand these instructions.  Will watch your condition.  Will get help right away if you are not doing well or get worse. Document Released: 11/17/2000 Document Revised: 11/03/2011 Document Reviewed: 09/25/2010 [facility] Patient Information 2015 [place], [place]. This information is not intended to replace advice given to you by your health care provider. Make sure you discuss any questions you have with your health care provider.  Patellar Fracture, Adult A patellar fracture is a break in your kneecap (patella).  CAUSES   A direct blow to the knee or a fall is usually the cause of a broken patella.  A very hard and strong bending of your knee can cause a patellar fracture. RISK FACTORS Involvement in contact sports, especially sports that involve a lot of jumping. SIGNS AND SYMPTOMS   Tender and swollen knee.  Pain when you move your knee, especially when you try to straighten out your leg.  Difficulty walking or putting weight on your knee.  Misshapen knee (as if a bone is out of place). DIAGNOSIS  Patellar fracture is usually diagnosed with a physical exam and an X-ray exam. TREATMENT  Treatment depends on the type of fracture:  If your patella is still in the right position after the fracture and you can still straighten your leg  out, you can usually be treated with a splint or cast for 4-6 weeks.  If your patella is broken into multiple small pieces but you are able to straighten your leg, you can usually be treated with a splint or cast for 4-6 weeks. Sometimes your patella may need to be removed before the cast is applied.  If you cannot straighten out your leg after a patellar fracture, then surgery is required to hold the bony fragments together until they heal. A cast or splint will be applied for 4-6 weeks. HOME CARE INSTRUCTIONS   Only take over-the-counter or  prescription medicines for pain, discomfort, or fever as directed by your health care provider.  Use crutches as directed, and exercise the leg as directed.  Apply ice to the injured area:  Put ice in a plastic bag.  Place a towel between your skin and the bag.  Leave the ice on for 20 minutes, 2-3 times a day.  Elevate the affected knee above the level of your heart. SEEK MEDICAL CARE IF:  You suspect you have significantly injured your knee.  You hear a pop after a knee injury.  Your knee is misshapen after a knee injury.  You have pain when you move your knee.  You have difficulty walking or putting weight on your knee.  You cannot fully move your knee. SEEK IMMEDIATE MEDICAL CARE IF:  You have redness, swelling, or increasing pain in your knee.  You have a fever. Document Released: 05/10/2003 Document Revised: 06/01/2013 Document Reviewed: 03/23/2013 [facility] Patient Information 2015 [place], [place]. This information is not intended to replace advice given to you by your health care provider. Make sure you discuss any questions you have with your health care provider.

## 2015-01-26 NOTE — ED Notes (Signed)
Patient states she has a cane in the car

## 2015-01-26 NOTE — ED Notes (Signed)
Patient was at [employer] earlier today and used the cart as a walker.  The cart got away from her and patient fell forward, landing on her left side.  Patient c/o left lower leg pain and left shoulder pain.  Left knee swelling noted.  Patient's lower left leg is warm to touch with +2 pulses.  Skin is warm and dry and she can wiggle toes.  Patient c/o left shoulder pain with some swelling noted to left shoulder.  +2 pulse palpated LUE.  Patient denies hitting her head tonight.  Patient states she fell last week and did hit her head.

## 2015-01-29 NOTE — Progress Notes (Addendum)
EDCM called [facility] and spoke to [facility] who confirms home health referral has been received and patient is scheduled to be seen tomorrow.  01/29/2015 A.Suzann Lazaro RNCM 1614pm.  EDCM called and spoke to patient for follow up.  Patient reports she is doing "Okay."  Patient reports her daughter [name] is staying with her, reports has difficulty getting up to go to the bathroom.  Patient reports she has a bedside commode.  Patient reports she was provided pain medicine on discharge and "it helps a little."  [facility] informed patient that [facility] [place] will be coming to see patient tomorrow.  Patient is aware of this.  [facility] asked patient for any further dme needs?  Patient and patient's daughter cannot think of any further needs at this time.  Patient thankful for phone call.  No further EDCM needs at this time.

## 2015-02-05 ENCOUNTER — Other Ambulatory Visit (HOSPITAL_COMMUNITY)
Admission: RE | Admit: 2015-02-05 | Discharge: 2015-02-05 | Disposition: A | Discharge status: Home / Self Care | Payer: Commercial Managed Care - HMO | Source: Other Acute Inpatient Hospital | Attending: Internal Medicine | Admitting: Internal Medicine

## 2015-02-05 DIAGNOSIS — N390 Urinary tract infection, site not specified: Secondary | ICD-10-CM | POA: Diagnosis present

## 2015-02-05 LAB — URINE MICROSCOPIC-ADD ON

## 2015-02-05 LAB — URINALYSIS, ROUTINE W REFLEX MICROSCOPIC
Bilirubin Urine: NEGATIVE
Glucose, UA: 250 mg/dL — AB
Ketones, ur: NEGATIVE mg/dL
Nitrite: NEGATIVE
Protein, ur: NEGATIVE mg/dL
Specific Gravity, Urine: 1.025 (ref 1.005–1.030)
UROBILINOGEN UA: 0.2 mg/dL (ref 0.0–1.0)
pH: 5 (ref 5.0–8.0)

## 2015-02-08 LAB — URINE CULTURE

## 2015-04-19 ENCOUNTER — Emergency Department (HOSPITAL_COMMUNITY): Payer: Commercial Managed Care - HMO

## 2015-04-19 ENCOUNTER — Inpatient Hospital Stay (HOSPITAL_COMMUNITY): Payer: Commercial Managed Care - HMO

## 2015-04-19 ENCOUNTER — Encounter (HOSPITAL_COMMUNITY): Payer: Self-pay | Admitting: Nurse Practitioner

## 2015-04-19 ENCOUNTER — Inpatient Hospital Stay (HOSPITAL_COMMUNITY)
Admission: EM | Admit: 2015-04-19 | Discharge: 2015-04-23 | DRG: 481 | Disposition: A | Discharge status: Skilled Nursing Facility | Payer: Commercial Managed Care - HMO | Attending: Internal Medicine | Admitting: Internal Medicine

## 2015-04-19 DIAGNOSIS — N3000 Acute cystitis without hematuria: Secondary | ICD-10-CM | POA: Diagnosis not present

## 2015-04-19 DIAGNOSIS — D62 Acute posthemorrhagic anemia: Secondary | ICD-10-CM | POA: Diagnosis not present

## 2015-04-19 DIAGNOSIS — W19XXXA Unspecified fall, initial encounter: Secondary | ICD-10-CM

## 2015-04-19 DIAGNOSIS — Z87891 Personal history of nicotine dependence: Secondary | ICD-10-CM | POA: Diagnosis not present

## 2015-04-19 DIAGNOSIS — Z888 Allergy status to other drugs, medicaments and biological substances status: Secondary | ICD-10-CM

## 2015-04-19 DIAGNOSIS — D649 Anemia, unspecified: Secondary | ICD-10-CM | POA: Diagnosis present

## 2015-04-19 DIAGNOSIS — Z809 Family history of malignant neoplasm, unspecified: Secondary | ICD-10-CM

## 2015-04-19 DIAGNOSIS — S72142A Displaced intertrochanteric fracture of left femur, initial encounter for closed fracture: Secondary | ICD-10-CM | POA: Diagnosis present

## 2015-04-19 DIAGNOSIS — Z01811 Encounter for preprocedural respiratory examination: Secondary | ICD-10-CM

## 2015-04-19 DIAGNOSIS — Z79891 Long term (current) use of opiate analgesic: Secondary | ICD-10-CM | POA: Diagnosis not present

## 2015-04-19 DIAGNOSIS — Z7982 Long term (current) use of aspirin: Secondary | ICD-10-CM

## 2015-04-19 DIAGNOSIS — S72002A Fracture of unspecified part of neck of left femur, initial encounter for closed fracture: Secondary | ICD-10-CM | POA: Diagnosis not present

## 2015-04-19 DIAGNOSIS — E780 Pure hypercholesterolemia: Secondary | ICD-10-CM | POA: Diagnosis present

## 2015-04-19 DIAGNOSIS — I252 Old myocardial infarction: Secondary | ICD-10-CM | POA: Diagnosis not present

## 2015-04-19 DIAGNOSIS — M069 Rheumatoid arthritis, unspecified: Secondary | ICD-10-CM | POA: Diagnosis present

## 2015-04-19 DIAGNOSIS — E1165 Type 2 diabetes mellitus with hyperglycemia: Secondary | ICD-10-CM | POA: Diagnosis present

## 2015-04-19 DIAGNOSIS — Y92019 Unspecified place in single-family (private) house as the place of occurrence of the external cause: Secondary | ICD-10-CM

## 2015-04-19 DIAGNOSIS — Z8249 Family history of ischemic heart disease and other diseases of the circulatory system: Secondary | ICD-10-CM | POA: Diagnosis not present

## 2015-04-19 DIAGNOSIS — I10 Essential (primary) hypertension: Secondary | ICD-10-CM | POA: Diagnosis not present

## 2015-04-19 DIAGNOSIS — Z794 Long term (current) use of insulin: Secondary | ICD-10-CM | POA: Diagnosis not present

## 2015-04-19 DIAGNOSIS — Z66 Do not resuscitate: Secondary | ICD-10-CM | POA: Diagnosis present

## 2015-04-19 DIAGNOSIS — W010XXA Fall on same level from slipping, tripping and stumbling without subsequent striking against object, initial encounter: Secondary | ICD-10-CM | POA: Diagnosis present

## 2015-04-19 DIAGNOSIS — N390 Urinary tract infection, site not specified: Secondary | ICD-10-CM | POA: Diagnosis present

## 2015-04-19 DIAGNOSIS — R9431 Abnormal electrocardiogram [ECG] [EKG]: Secondary | ICD-10-CM | POA: Diagnosis present

## 2015-04-19 DIAGNOSIS — Z8673 Personal history of transient ischemic attack (TIA), and cerebral infarction without residual deficits: Secondary | ICD-10-CM | POA: Diagnosis not present

## 2015-04-19 DIAGNOSIS — M1990 Unspecified osteoarthritis, unspecified site: Secondary | ICD-10-CM | POA: Diagnosis present

## 2015-04-19 DIAGNOSIS — IMO0002 Reserved for concepts with insufficient information to code with codable children: Secondary | ICD-10-CM | POA: Diagnosis present

## 2015-04-19 DIAGNOSIS — Z823 Family history of stroke: Secondary | ICD-10-CM

## 2015-04-19 DIAGNOSIS — F068 Other specified mental disorders due to known physiological condition: Secondary | ICD-10-CM | POA: Diagnosis not present

## 2015-04-19 DIAGNOSIS — S72002D Fracture of unspecified part of neck of left femur, subsequent encounter for closed fracture with routine healing: Secondary | ICD-10-CM | POA: Diagnosis not present

## 2015-04-19 DIAGNOSIS — Z79899 Other long term (current) drug therapy: Secondary | ICD-10-CM | POA: Diagnosis not present

## 2015-04-19 DIAGNOSIS — G510 Bell's palsy: Secondary | ICD-10-CM | POA: Diagnosis present

## 2015-04-19 DIAGNOSIS — E785 Hyperlipidemia, unspecified: Secondary | ICD-10-CM | POA: Diagnosis present

## 2015-04-19 DIAGNOSIS — Z800 Family history of malignant neoplasm of digestive organs: Secondary | ICD-10-CM

## 2015-04-19 DIAGNOSIS — Z419 Encounter for procedure for purposes other than remedying health state, unspecified: Secondary | ICD-10-CM

## 2015-04-19 DIAGNOSIS — F329 Major depressive disorder, single episode, unspecified: Secondary | ICD-10-CM | POA: Diagnosis present

## 2015-04-19 DIAGNOSIS — M25552 Pain in left hip: Secondary | ICD-10-CM | POA: Diagnosis present

## 2015-04-19 DIAGNOSIS — Z833 Family history of diabetes mellitus: Secondary | ICD-10-CM

## 2015-04-19 DIAGNOSIS — K219 Gastro-esophageal reflux disease without esophagitis: Secondary | ICD-10-CM | POA: Diagnosis present

## 2015-04-19 LAB — CBC WITH DIFFERENTIAL/PLATELET
BASOS PCT: 0 % (ref 0–1)
Basophils Absolute: 0 10*3/uL (ref 0.0–0.1)
EOS PCT: 0 % (ref 0–5)
Eosinophils Absolute: 0 10*3/uL (ref 0.0–0.7)
HCT: 35 % — ABNORMAL LOW (ref 36.0–46.0)
HEMOGLOBIN: 11.8 g/dL — AB (ref 12.0–15.0)
Lymphocytes Relative: 4 % — ABNORMAL LOW (ref 12–46)
Lymphs Abs: 0.6 10*3/uL — ABNORMAL LOW (ref 0.7–4.0)
MCH: 31.5 pg (ref 26.0–34.0)
MCHC: 33.7 g/dL (ref 30.0–36.0)
MCV: 93.3 fL (ref 78.0–100.0)
Monocytes Absolute: 0.8 10*3/uL (ref 0.1–1.0)
Monocytes Relative: 5 % (ref 3–12)
NEUTROS PCT: 91 % — AB (ref 43–77)
Neutro Abs: 15.2 10*3/uL — ABNORMAL HIGH (ref 1.7–7.7)
Platelets: 209 10*3/uL (ref 150–400)
RBC: 3.75 MIL/uL — ABNORMAL LOW (ref 3.87–5.11)
RDW: 14.2 % (ref 11.5–15.5)
WBC: 16.6 10*3/uL — AB (ref 4.0–10.5)

## 2015-04-19 LAB — URINE MICROSCOPIC-ADD ON

## 2015-04-19 LAB — URINALYSIS, ROUTINE W REFLEX MICROSCOPIC
BILIRUBIN URINE: NEGATIVE
GLUCOSE, UA: NEGATIVE mg/dL
HGB URINE DIPSTICK: NEGATIVE
Ketones, ur: NEGATIVE mg/dL
Nitrite: NEGATIVE
PROTEIN: NEGATIVE mg/dL
Specific Gravity, Urine: 1.021 (ref 1.005–1.030)
Urobilinogen, UA: 0.2 mg/dL (ref 0.0–1.0)
pH: 6 (ref 5.0–8.0)

## 2015-04-19 LAB — BASIC METABOLIC PANEL
Anion gap: 9 (ref 5–15)
BUN: 20 mg/dL (ref 6–20)
CALCIUM: 9.6 mg/dL (ref 8.9–10.3)
CHLORIDE: 101 mmol/L (ref 101–111)
CO2: 28 mmol/L (ref 22–32)
Creatinine, Ser: 0.88 mg/dL (ref 0.44–1.00)
Glucose, Bld: 107 mg/dL — ABNORMAL HIGH (ref 65–99)
Potassium: 3.8 mmol/L (ref 3.5–5.1)
SODIUM: 138 mmol/L (ref 135–145)

## 2015-04-19 LAB — ABO/RH: ABO/RH(D): O POS

## 2015-04-19 LAB — PROTIME-INR
INR: 1.09 (ref 0.00–1.49)
Prothrombin Time: 14.3 seconds (ref 11.6–15.2)

## 2015-04-19 LAB — GLUCOSE, CAPILLARY
GLUCOSE-CAPILLARY: 235 mg/dL — AB (ref 65–99)
Glucose-Capillary: 117 mg/dL — ABNORMAL HIGH (ref 65–99)

## 2015-04-19 MED ORDER — MORPHINE SULFATE (PF) 4 MG/ML IV SOLN
4.0000 mg | INTRAVENOUS | Status: AC | PRN
  Administered 2015-04-19 (×2): 4 mg via INTRAVENOUS
  Filled 2015-04-19 (×2): qty 1

## 2015-04-19 MED ORDER — SERTRALINE HCL 50 MG PO TABS
50.0000 mg | ORAL_TABLET | Freq: Every day | ORAL | Status: DC
  Administered 2015-04-21 – 2015-04-23 (×3): 50 mg via ORAL
  Filled 2015-04-19 (×4): qty 1

## 2015-04-19 MED ORDER — HYDROCODONE-ACETAMINOPHEN 5-325 MG PO TABS
1.0000 | ORAL_TABLET | Freq: Four times a day (QID) | ORAL | Status: DC | PRN
  Administered 2015-04-20: 1 via ORAL
  Filled 2015-04-19: qty 1

## 2015-04-19 MED ORDER — MORPHINE SULFATE (PF) 2 MG/ML IV SOLN
0.5000 mg | INTRAVENOUS | Status: DC | PRN
  Administered 2015-04-20 – 2015-04-21 (×5): 0.5 mg via INTRAVENOUS
  Filled 2015-04-19 (×5): qty 1

## 2015-04-19 MED ORDER — INSULIN ASPART 100 UNIT/ML ~~LOC~~ SOLN
0.0000 [IU] | Freq: Three times a day (TID) | SUBCUTANEOUS | Status: DC
  Administered 2015-04-20: 2 [IU] via SUBCUTANEOUS
  Administered 2015-04-20 – 2015-04-21 (×2): 3 [IU] via SUBCUTANEOUS
  Administered 2015-04-21: 5 [IU] via SUBCUTANEOUS
  Administered 2015-04-22: 3 [IU] via SUBCUTANEOUS
  Administered 2015-04-22: 2 [IU] via SUBCUTANEOUS
  Administered 2015-04-23: 5 [IU] via SUBCUTANEOUS

## 2015-04-19 MED ORDER — SODIUM CHLORIDE 0.9 % IV SOLN
INTRAVENOUS | Status: DC
  Administered 2015-04-19: 23:00:00 via INTRAVENOUS

## 2015-04-19 MED ORDER — HYDROCODONE-ACETAMINOPHEN 5-325 MG PO TABS: 1.0000 | ORAL_TABLET | ORAL | Status: DC | PRN

## 2015-04-19 MED ORDER — PANTOPRAZOLE SODIUM 40 MG PO TBEC
40.0000 mg | DELAYED_RELEASE_TABLET | Freq: Every day | ORAL | Status: DC
  Administered 2015-04-21 – 2015-04-23 (×3): 40 mg via ORAL
  Filled 2015-04-19 (×4): qty 1

## 2015-04-19 MED ORDER — ONDANSETRON HCL 4 MG/2ML IJ SOLN
4.0000 mg | Freq: Three times a day (TID) | INTRAMUSCULAR | Status: DC | PRN
  Administered 2015-04-19: 4 mg via INTRAVENOUS
  Filled 2015-04-19: qty 2

## 2015-04-19 MED ORDER — LISINOPRIL 10 MG PO TABS
10.0000 mg | ORAL_TABLET | Freq: Every day | ORAL | Status: DC
Start: 2015-04-20 — End: 2015-04-23
  Administered 2015-04-21 – 2015-04-23 (×3): 10 mg via ORAL
  Filled 2015-04-19 (×4): qty 1

## 2015-04-19 MED ORDER — DULOXETINE HCL 60 MG PO CPEP
60.0000 mg | ORAL_CAPSULE | Freq: Every day | ORAL | Status: DC
  Administered 2015-04-21 – 2015-04-23 (×3): 60 mg via ORAL
  Filled 2015-04-19 (×4): qty 1

## 2015-04-19 MED ORDER — MORPHINE SULFATE (PF) 4 MG/ML IV SOLN: 4.0000 mg | INTRAVENOUS | Status: DC | PRN

## 2015-04-19 MED ORDER — LORATADINE 10 MG PO TABS
10.0000 mg | ORAL_TABLET | Freq: Every day | ORAL | Status: DC
  Administered 2015-04-21 – 2015-04-23 (×3): 10 mg via ORAL
  Filled 2015-04-19 (×4): qty 1

## 2015-04-19 MED ORDER — INSULIN GLARGINE 100 UNIT/ML ~~LOC~~ SOLN
20.0000 [IU] | Freq: Every day | SUBCUTANEOUS | Status: DC
  Administered 2015-04-19 – 2015-04-20 (×2): 20 [IU] via SUBCUTANEOUS
  Filled 2015-04-19 (×2): qty 0.2

## 2015-04-19 NOTE — ED Notes (Signed)
Bed: WA04 Expected date:  Expected time:  Means of arrival:  Comments: Ems 

## 2015-04-19 NOTE — ED Notes (Signed)
Delay in lab draw, PA in with pt. 

## 2015-04-19 NOTE — ED Notes (Signed)
Patient transported to X-ray 

## 2015-04-19 NOTE — ED Provider Notes (Signed)
CSN: [id number]     Arrival date & time 04/19/15  1723 History   First MD Initiated Contact with Patient 04/19/15 1735     Chief Complaint  Patient presents with  . Fall  . Hip Pain    External Rotation and shortening of left hip     HPI   Tracey Mason is a 69 y.o. female with a PMH of HTN, HLD, DM, anemia who presents to the ED with hip pain s/p mechanical fall. She reports she woke up this morning and was trying to make her bed, when she felt unbalanced on her left side, which she attributes to recent left patella fracture, and fell on her left side. She reports left hip pain. She denies numbness, paresthesia. She denies lightheadedness, dizziness, syncope. She states she did not hit her head or lose consciousness. She denies chest pain, palpitations. She reports shortness of breath. She denies abdominal pain, nausea, vomiting, diarrhea, constipation. She reports she was lying on her floor for approximately 3 hours before someone came to help her. Patient seen in the ED 01/26/15 s/p mechanical fall with left humerus and left patella fracture.   Past Medical History  Diagnosis Date  . Depression   . Hypertension   . Acid reflux   . Diverticular disease   . Rheumatoid arthritis(714.0)   . Osteoarthritis   . Bell's palsy     Left  . Hypercholesterolemia   . Myocardial infarction     ' i have been told I did "  . Diabetes mellitus     insulin dependent  . Anemia   . H/O hiatal hernia   . Stroke     mild memory loss  . Hepatitis     Hepatitis B  . Abnormality of gait 09/26/2013  . Memory deficit 09/26/2013   Past Surgical History  Procedure Laterality Date  . Appendectomy    . Tonsillectomy    . Cholecystectomy    . Abdominal hysterectomy    . Fracture surgery    . Orif tibia fracture  01/15/2012    tib /fib fracture  . Cataract extraction Bilateral   . Esophageal dilation      esophageal stricture   Family History  Problem Relation Age of Onset  . Diabetes type II     . Coronary artery disease    . Diabetes type II Mother   . Coronary artery disease Mother   . Stroke Father   . Cancer Brother   . Cancer Sister     stomach  . Cancer Sister     colon  . Diabetes type II Brother    Social History  Substance Use Topics  . Smoking status: Former Smoker    Types: Cigarettes    Quit date: 01/26/1995  . Smokeless tobacco: Never Used  . Alcohol Use: 0.5 oz/week    1 Standard drinks or equivalent per week     Comment: one a week   OB History    No data available       Review of Systems  Constitutional: Negative for fever, chills, diaphoresis, activity change, appetite change and fatigue.  HENT: Negative for congestion.   Eyes: Negative for visual disturbance.  Respiratory: Positive for shortness of breath. Negative for cough and wheezing.   Cardiovascular: Negative for chest pain, palpitations and leg swelling.  Gastrointestinal: Negative for nausea, vomiting, abdominal pain, diarrhea, constipation and abdominal distention.  Genitourinary: Negative for dysuria, urgency and frequency.  Musculoskeletal: Positive for arthralgias.  Negative for myalgias, back pain, neck pain and neck stiffness.  Skin: Negative for color change, pallor, rash and wound.  Neurological: Positive for weakness. Negative for dizziness, syncope, facial asymmetry, speech difficulty, light-headedness, numbness and headaches.       Reports left sided lower extremity weakness, which she attributes to recent patella fracture.  All other systems reviewed and are negative.    Allergies  Prednisone and Cortisone  Home Medications   Prior to Admission medications   Medication Sig Start Date End Date Taking? Authorizing Provider  Ascorbic Acid (VITAMIN C) 1000 MG tablet Take 1,000 mg by mouth daily.     Yes Historical Provider, MD  aspirin 81 MG tablet Take 81 mg by mouth daily.     Yes Historical Provider, MD  Calcium Citrate-Vitamin D (CITRACAL + D PO) Take 1 tablet by  mouth 2 (two) times daily.   Yes Historical Provider, MD  cetirizine (ZYRTEC) 10 MG tablet Take 10 mg by mouth daily.   Yes Historical Provider, MD  DULoxetine (CYMBALTA) 60 MG capsule Take 60 mg by mouth daily.   Yes Historical Provider, MD  HYDROcodone-acetaminophen (NORCO/VICODIN) 5-325 MG per tablet Take 1-2 tablets by mouth every 6 (six) hours as needed. 01/26/15  Yes Ladona Mow, PA-C  insulin glargine (LANTUS) 100 UNIT/ML injection Inject 50 Units into the skin at bedtime.   Yes Historical Provider, MD  insulin lispro (HUMALOG) 100 UNIT/ML injection Inject 30 Units into the skin 3 (three) times daily before meals.   Yes Historical Provider, MD  lisinopril (PRINIVIL,ZESTRIL) 10 MG tablet Take 1 tablet (10 mg total) by mouth daily. 02/27/13  Yes Drema Dallas, MD  meloxicam (MOBIC) 15 MG tablet Take 15 mg by mouth daily.     Yes Historical Provider, MD  pantoprazole (PROTONIX) 40 MG tablet Take 40 mg by mouth daily.     Yes Historical Provider, MD  sertraline (ZOLOFT) 50 MG tablet Take 50 mg by mouth daily.   Yes Geoffry Paradise, MD     BP 146/61 mmHg  Pulse 105  Temp(Src) 99 F (37.2 C) (Oral)  Resp 16  Ht 5' (1.524 m)  Wt 130 lb (58.968 kg)  BMI 25.39 kg/m2  SpO2 98% Physical Exam  Constitutional: She is oriented to person, place, and time. Vital signs are normal. She appears well-developed and well-nourished.  Patient in mild distress due to pain.  HENT:  Head: Normocephalic and atraumatic.  Right Ear: External ear normal.  Left Ear: External ear normal.  Nose: Nose normal.  Mouth/Throat: Uvula is midline, oropharynx is clear and moist and mucous membranes are normal.  Eyes: Conjunctivae, EOM and lids are normal. Pupils are equal, round, and reactive to light. Right eye exhibits no discharge. Left eye exhibits no discharge. No scleral icterus.  Neck: Normal range of motion. Neck supple.  Cardiovascular: Normal rate, regular rhythm, normal heart sounds, intact distal pulses and  normal pulses.   Pulmonary/Chest: Effort normal and breath sounds normal. No respiratory distress.  Abdominal: Soft. Normal appearance and bowel sounds are normal. She exhibits no distension and no mass. There is no tenderness. There is no rigidity, no rebound and no guarding.  Musculoskeletal: She exhibits no edema.       Left hip: She exhibits decreased range of motion, decreased strength, tenderness, bony tenderness and swelling. She exhibits no crepitus.  Left lower extremity shortened and externally rotated at hip. Tenderness to palpation and edema over left hip.  Neurological: She is alert and oriented to  person, place, and time. No sensory deficit.  Decreased strength of left lower extremity due to pain.  Skin: Skin is warm, dry and intact. No rash noted. She is not diaphoretic. No erythema. No pallor.  Psychiatric: She has a normal mood and affect. Her speech is normal and behavior is normal. Judgment and thought content normal.  Nursing note and vitals reviewed.   ED Course  Procedures (including critical care time)  Labs Review Labs Reviewed  CBC WITH DIFFERENTIAL/PLATELET - Abnormal; Notable for the following:    WBC 16.6 (*)    RBC 3.75 (*)    Hemoglobin 11.8 (*)    HCT 35.0 (*)    Neutrophils Relative % 91 (*)    Neutro Abs 15.2 (*)    Lymphocytes Relative 4 (*)    Lymphs Abs 0.6 (*)    All other components within normal limits  BASIC METABOLIC PANEL - Abnormal; Notable for the following:    Glucose, Bld 107 (*)    All other components within normal limits  URINALYSIS, ROUTINE W REFLEX MICROSCOPIC (NOT AT [facility]) - Abnormal; Notable for the following:    APPearance CLOUDY (*)    Leukocytes, UA SMALL (*)    All other components within normal limits  URINE MICROSCOPIC-ADD ON - Abnormal; Notable for the following:    Squamous Epithelial / LPF MANY (*)    Bacteria, UA MANY (*)    All other components within normal limits  GLUCOSE, CAPILLARY - Abnormal; Notable for the  following:    Glucose-Capillary 235 (*)    All other components within normal limits  GLUCOSE, CAPILLARY - Abnormal; Notable for the following:    Glucose-Capillary 117 (*)    All other components within normal limits  URINE CULTURE  PROTIME-INR  CBC WITH DIFFERENTIAL/PLATELET  COMPREHENSIVE METABOLIC PANEL  TYPE AND SCREEN  ABO/RH    Imaging Review Dg Chest Port 1 View  04/19/2015   CLINICAL DATA:  Preop for left hip fracture.  No chest complaints.  EXAM: PORTABLE CHEST - 1 VIEW  COMPARISON:  12/09/2013  FINDINGS: Shallow inspiration. Normal heart size and pulmonary vascularity. No focal airspace disease or consolidation in the lungs. No blunting of costophrenic angles. No pneumothorax. Mediastinal contours appear intact. Fracture of the left humeral neck was not definitely present on previous studies but is likely old as there appears to be some loss of distinction of the fracture line and callus formation. If patient is symptomatic in the left shoulder, left shoulder series is suggested.  IMPRESSION: No evidence of active pulmonary disease. Left humeral neck fracture, probably old. If the patient is symptomatic in this area suggest left shoulder radiographs.   Electronically Signed   By: Burman Nieves M.D.   On: 04/19/2015 20:52   Dg Hip Unilat With Pelvis 2-3 Views Left  04/19/2015   ADDENDUM REPORT: 04/19/2015 20:41  ADDENDUM: CORRECTED REPORT:  Fracture is of the intertrochanteric left FEMUR.  Impression: Acute fracture inter trochanteric region of the left femur with  varus angulation.   Electronically Signed   By: Burman Nieves M.D.   On: 04/19/2015 20:41   04/19/2015   CLINICAL DATA:  Fall. Hip pain, external rotation comment shortening. Trip and fall injury landing on the left hip.  EXAM: DG HIP (WITH OR WITHOUT PELVIS) 2-3V LEFT  COMPARISON:  01/26/2015  FINDINGS: Acute comminuted posttraumatic fracture of the inter trochanteric region of the left humerus with varus angulation of  the fracture fragments and mild displacement of the lesser  trochanteric fragment. No dislocation of the hip. Mild degenerative changes demonstrated in the right hip. Pelvis appears intact. SI joints and symphysis pubis are not displaced.  IMPRESSION: Acute fracture inter trochanteric region of the left humerus with varus angulation.  Electronically Signed: By: Burman Nieves M.D. On: 04/19/2015 18:26     I have personally reviewed and evaluated these images and lab results as part of my medical decision-making.   EKG Interpretation   Date/Time:  Thursday April 19 2015 17:41:17 EDT Ventricular Rate:  99 PR Interval:  135 QRS Duration: 89 QT Interval:  390 QTC Calculation: 500 R Axis:   72 Text Interpretation:  Sinus rhythm Probable inferior infarct, old  Prolonged QT interval SINCE LAST TRACING HEART RATE HAS INCREASED  Confirmed by Ethelda Chick  MD, SAM [phone]) on 04/19/2015 8:28:59 PM      MDM   Final diagnoses:  Fall    69 year old female presents with left hip pain s/p mechanical fall this morning. Denies numbness, paresthesia. Patient seen in the ED 01/26/15 s/p mechanical fall with left humerus and left patella fracture.  Patient is afebrile. Vital signs stable. O2 sat 98% on 2L. On exam, left lower extremity shortened and externally rotated at the hip. Tenderness to palpation of left hip with surrounding edema. Patient unable to move left lower extremity at hip due to pain, decreased strength of left lower extremity due to pain. Sensation intact. Distal pulses intact.  Pain controlled in the ED with morphine.   Type and screen obtained. CBC remarkable for WBC elevated at 16.6 and anemia, which appears chronic and stable. BMP and PT/INR within normal limits. Imaging of left hip demonstrates acute intertrochanteric femur fracture with varus angulation. Ortho consulted. Sharilyn Sites, PA-C spoke with Dr. Charlann Boxer. Ortho will do repair tomorrow morning. NPO after midnight  tonight.  Hospitalist consulted. Patient to be admitted.   BP 146/61 mmHg  Pulse 105  Temp(Src) 99 F (37.2 C) (Oral)  Resp 16  Ht 5' (1.524 m)  Wt 130 lb (58.968 kg)  BMI 25.39 kg/m2  SpO2 98%       Mady Gemma, PA-C 04/19/15 2351  Arby Barrette, MD 05/02/15 [phone]

## 2015-04-19 NOTE — ED Notes (Signed)
Pt is presented from [facility] on [address]. C/o left hip pain which on assessment has external rotation and shortening secondary to a fall. Reports she tripped off her bed, although she laid on the floor for 3 hours prior to home health nurse finding her, she denies LOC, head or neck pain. Pt received 50 mcg of fentanyl en route, which took her pain from 10/10 to 2/10 at this time. Pt is AOx4.

## 2015-04-19 NOTE — H&P (Signed)
[facility] History and Physical  Tracey Mason:[id number] DOB: 07/13/1946 DOA: 04/19/2015  Referring physician: Ms.Westfall. PCP: Minda Meo, MD  Specialists: Dr.Willis. Neurologist.  Chief Complaint: Fall.  HPI: Tracey Mason is a 69 y.o. female with history of diabetes mellitus type 2, hypertension, previous stroke, diastolic dysfunction was brought to the ER after patient had a fall at her house. X-rays reveal a left hip fracture. Patient was recently discharged from rehabilitation last week after being conservatively managed for her left patellar and left humerus fracture sustained in June of this year. Orthopedic surgeon Dr.Ollin was consulted by ER physician and plans to have surgery in a.m. On my exam patient denies any chest pain or shortness of breath headache visual symptoms nausea vomiting abdominal pain or diarrhea. Patient states she was walking with help of a walker when she suddenly lost balance and fell. Denies having hit her head or loss consciousness or did not have any dizziness or palpitations.   Review of Systems: As presented in the history of presenting illness, rest negative.  Past Medical History  Diagnosis Date  . Depression   . Hypertension   . Acid reflux   . Diverticular disease   . Rheumatoid arthritis(714.0)   . Osteoarthritis   . Bell's palsy     Left  . Hypercholesterolemia   . Myocardial infarction     ' i have been told I did "  . Diabetes mellitus     insulin dependent  . Anemia   . H/O hiatal hernia   . Stroke     mild memory loss  . Hepatitis     Hepatitis B  . Abnormality of gait 09/26/2013  . Memory deficit 09/26/2013   Past Surgical History  Procedure Laterality Date  . Appendectomy    . Tonsillectomy    . Cholecystectomy    . Abdominal hysterectomy    . Fracture surgery    . Orif tibia fracture  01/15/2012    tib /fib fracture  . Cataract extraction Bilateral   . Esophageal dilation      esophageal  stricture   Social History:  reports that she quit smoking about 20 years ago. Her smoking use included Cigarettes. She has never used smokeless tobacco. She reports that she drinks about 0.5 oz of alcohol per week. She reports that she does not use illicit drugs. Where does patient live at home. Can patient participate in ADLs? Yes.  Allergies  Allergen Reactions  . Prednisone Other (See Comments)    Causes stroke  . Cortisone Other (See Comments)    Causes stroke    Family History:  Family History  Problem Relation Age of Onset  . Diabetes type II    . Coronary artery disease    . Diabetes type II Mother   . Coronary artery disease Mother   . Stroke Father   . Cancer Brother   . Cancer Sister     stomach  . Cancer Sister     colon  . Diabetes type II Brother       Prior to Admission medications   Medication Sig Start Date End Date Taking? Authorizing Provider  Ascorbic Acid (VITAMIN C) 1000 MG tablet Take 1,000 mg by mouth daily.      Historical Provider, MD  aspirin 81 MG tablet Take 81 mg by mouth daily.      Historical Provider, MD  Calcium Citrate-Vitamin D (CITRACAL + D PO) Take 1 tablet by mouth 2 (two) times  daily.    Historical Provider, MD  DULoxetine (CYMBALTA) 60 MG capsule Take 60 mg by mouth daily.    Historical Provider, MD  HYDROcodone-acetaminophen (NORCO/VICODIN) 5-325 MG per tablet Take 1-2 tablets by mouth every 6 (six) hours as needed. 01/26/15   Ladona Mow, PA-C  insulin glargine (LANTUS) 100 UNIT/ML injection Inject 50 Units into the skin at bedtime.    Historical Provider, MD  insulin lispro (HUMALOG) 100 UNIT/ML injection Inject 30 Units into the skin 3 (three) times daily before meals.    Historical Provider, MD  lisinopril (PRINIVIL,ZESTRIL) 10 MG tablet Take 1 tablet (10 mg total) by mouth daily. 02/27/13   Drema Dallas, MD  loratadine (CLARITIN) 10 MG tablet Take 10 mg by mouth daily.    Historical Provider, MD  meloxicam (MOBIC) 15 MG tablet Take  15 mg by mouth daily.      Historical Provider, MD  pantoprazole (PROTONIX) 40 MG tablet Take 40 mg by mouth daily.      Historical Provider, MD  sertraline (ZOLOFT) 50 MG tablet Take 50 mg by mouth daily.    Geoffry Paradise, MD    Physical Exam: Filed Vitals:   04/19/15 1730 04/19/15 1734 04/19/15 1741 04/19/15 2015  BP:  171/71  163/69  Pulse:  89  100  Temp:  98.4 F (36.9 C)    TempSrc:  Oral    Resp:    24  Height:  5' (1.524 m)    Weight:  58.968 kg (130 lb)    SpO2: 97% 88% 100% 97%     General:  Moderately built and nourished.  Eyes: Anicteric no pallor.  ENT: No discharge from the ears eyes nose and mouth.  Neck: No mass felt.  Cardiovascular: S1-S2 heard.  Respiratory: No rhonchi or crepitations.  Abdomen: Soft nontender bowel sounds present.  Skin: No rash.  Musculoskeletal: Left lower extremity is externally rotated.  Psychiatric: Appears normal.  Neurologic: Alert awake oriented to time place and person. Moves all extremities.  Labs on Admission:  Basic Metabolic Panel:  Recent Labs Lab 04/19/15 1814  NA 138  K 3.8  CL 101  CO2 28  GLUCOSE 107*  BUN 20  CREATININE 0.88  CALCIUM 9.6   Liver Function Tests: No results for input(s): AST, ALT, ALKPHOS, BILITOT, PROT, ALBUMIN in the last 168 hours. No results for input(s): LIPASE, AMYLASE in the last 168 hours. No results for input(s): AMMONIA in the last 168 hours. CBC:  Recent Labs Lab 04/19/15 1814  WBC 16.6*  NEUTROABS 15.2*  HGB 11.8*  HCT 35.0*  MCV 93.3  PLT 209   Cardiac Enzymes: No results for input(s): CKTOTAL, CKMB, CKMBINDEX, TROPONINI in the last 168 hours.  BNP (last 3 results) No results for input(s): BNP in the last 8760 hours.  ProBNP (last 3 results) No results for input(s): PROBNP in the last 8760 hours.  CBG: No results for input(s): GLUCAP in the last 168 hours.  Radiological Exams on Admission: Dg Hip Unilat With Pelvis 2-3 Views Left  04/19/2015    ADDENDUM REPORT: 04/19/2015 20:41  ADDENDUM: CORRECTED REPORT:  Fracture is of the intertrochanteric left FEMUR.  Impression: Acute fracture inter trochanteric region of the left femur with  varus angulation.   Electronically Signed   By: Burman Nieves M.D.   On: 04/19/2015 20:41   04/19/2015   CLINICAL DATA:  Fall. Hip pain, external rotation comment shortening. Trip and fall injury landing on the left hip.  EXAM: DG HIP (  WITH OR WITHOUT PELVIS) 2-3V LEFT  COMPARISON:  01/26/2015  FINDINGS: Acute comminuted posttraumatic fracture of the inter trochanteric region of the left humerus with varus angulation of the fracture fragments and mild displacement of the lesser trochanteric fragment. No dislocation of the hip. Mild degenerative changes demonstrated in the right hip. Pelvis appears intact. SI joints and symphysis pubis are not displaced.  IMPRESSION: Acute fracture inter trochanteric region of the left humerus with varus angulation.  Electronically Signed: By: Burman Nieves M.D. On: 04/19/2015 18:26    EKG: Independently reviewed. Normal sinus rhythm with QTC of 500 ms.  Assessment/Plan Principal Problem:   Hip fracture, left Active Problems:   Hypertension   Closed left hip fracture   Diabetes mellitus type 2, uncontrolled   Chronic anemia   1. Left hip fracture status post mechanical fall - from medical standpoint of view patient is moderate risk for intermediate risk procedure. Patient will be kept nothing by mouth after midnight in preparation for left hip surgery. Patient will be on pain relief medications. Further recommendations per orthopedic surgery. 2. Diabetes mellitus type 2 - patient is usually on Lantus 50 units at bedtime since patient will remain nothing by mouth after midnight and have decreased the Lantus dose to 20 units. As per patient's daughter patient's blood sugar has been not well controlled and has been on large doses of pre-meal insulin. Once postoperative and  patient starts eating well patient's blood sugar has to be aggressively controlled and may need to restart her home dose of insulin. For now I have placed patient on moderate dose sliding scale. 3. Hypertension - on lisinopril. 4. Leukocytosis - probably reactionary. Patient is afebrile. UA and chest x-ray remains unremarkable. 5. History of CVA - on aspirin. Need to restart after surgery once okay with surgeon. 6. Recent left humerus and left patellar fracture managed conservatively. 7. History of diastolic dysfunction per 2-D echo done in 2014 which showed EF of 60-65% with grade 1 diastolic dysfunction. Patient appears compensated at this time. 8. Prolonged QTC in the EKG of 500 ms. Check electrolytes and avoid QTC prolonging medications.  I have reviewed patient's old charts of labs. I have personally reviewed patient's chest x-rays and EKG.   DVT Prophylaxis SCD for now.  Code Status: DO NOT RESUSCITATE.  Family Communication: Patient's daughter.  Disposition Plan: Admit to inpatient.    Marelyn Rouser N. [facility] Pager [phone].  If 7PM-7AM, please contact night-coverage [email or web address] Password [facility] 04/19/2015, 8:44 PM

## 2015-04-19 NOTE — Clinical Social Work Note (Signed)
Clinical Social Work Assessment  Patient Details  Name: Tracey Mason MRN: [id number] Date of Birth: August 06, 1946  Date of referral:  04/19/15               Reason for consult:   (Fall.)                Permission sought to share information with:   (None.) Permission granted to share information::  No  Name::        Agency::     Relationship::     Contact Information:     Housing/Transportation Living arrangements for the past 2 months:  Per note, patient stays at [facility] senior living on [address]. Source of Information:  Patient Patient Interpreter Needed:  None Criminal Activity/Legal Involvement Pertinent to Current Situation/Hospitalization:  No - Comment as needed Significant Relationships:  Adult Children (Patient informed CSW that her daughter Tracey Mason is her primary support. Patient states her daughter visits her daily .) Lives with:  Self Do you feel safe going back to the place where you live?  Yes Need for family participation in patient care:   (Daughter is patient's primary support.)  Care giving concerns:  There are no care giving concerns at this time. Per note. The patient stays at [facility] living on [address]. Patient informed CSW that she has a great support system, which consist of her daughter.  Patient states there daughter stays in [place]. According to patient, her daughter visits her daily.   Social Worker assessment / plan:  CSW met with patient at bedside. There was no family present.  Patient presents to [facility] due to C/o hip pain.  According to patient, she falls often. Patient states she cannot remember how many times she has fallen within the past 6 months. Patient informed CSW that she has bad arthritis in her legs.  Patient states that she has a great support system. Patient states her daughter visits her daily. Per note, patient reports she tripped off her bed, although she laid on the floor for 3 hours prior to home health nurse finding her,  she denies LOC, head or neck pain.  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  Patient/Family's Response to care:  Patient is appropriate at this time. Patient is aware that she will be admitted.   Patient/Family's Understanding of and Emotional Response to Diagnosis, Current Treatment, and Prognosis:  Patient is understanding at this time. Patient states that she does not have any questions for CSW.  Emotional Assessment Appearance:  Appears stated age Attitude/Demeanor/Rapport:   (Well Mannered.) Affect (typically observed):  Accepting, Appropriate Orientation:  Oriented to Self, Oriented to Place, Oriented to  Time, Oriented to Situation Alcohol / Substance use:  Not Applicable Psych involvement (Current and /or in the community):  No (Comment)  Discharge Needs  Concerns to be addressed:  No discharge needs identified Readmission within the last 30 days:  No Current discharge risk:  None Barriers to Discharge:  No Barriers Identified   Bernita Buffy, LCSW 04/19/2015, 11:03 PM

## 2015-04-20 ENCOUNTER — Inpatient Hospital Stay (HOSPITAL_COMMUNITY): Payer: Commercial Managed Care - HMO | Admitting: Anesthesiology

## 2015-04-20 ENCOUNTER — Encounter (HOSPITAL_COMMUNITY)
Admission: EM | Disposition: A | Discharge status: Skilled Nursing Facility | Payer: Self-pay | Source: Home / Self Care | Attending: Internal Medicine

## 2015-04-20 ENCOUNTER — Inpatient Hospital Stay (HOSPITAL_COMMUNITY): Payer: Commercial Managed Care - HMO

## 2015-04-20 DIAGNOSIS — E1165 Type 2 diabetes mellitus with hyperglycemia: Secondary | ICD-10-CM

## 2015-04-20 HISTORY — PX: FEMUR IM NAIL: SHX1597

## 2015-04-20 LAB — CBC WITH DIFFERENTIAL/PLATELET
BASOS ABS: 0 10*3/uL (ref 0.0–0.1)
BASOS PCT: 0 % (ref 0–1)
Eosinophils Absolute: 0 10*3/uL (ref 0.0–0.7)
Eosinophils Relative: 0 % (ref 0–5)
HEMATOCRIT: 31.5 % — AB (ref 36.0–46.0)
HEMOGLOBIN: 10.8 g/dL — AB (ref 12.0–15.0)
Lymphocytes Relative: 16 % (ref 12–46)
Lymphs Abs: 1.6 10*3/uL (ref 0.7–4.0)
MCH: 32.3 pg (ref 26.0–34.0)
MCHC: 34.3 g/dL (ref 30.0–36.0)
MCV: 94.3 fL (ref 78.0–100.0)
MONOS PCT: 9 % (ref 3–12)
Monocytes Absolute: 0.9 10*3/uL (ref 0.1–1.0)
NEUTROS ABS: 7.2 10*3/uL (ref 1.7–7.7)
NEUTROS PCT: 75 % (ref 43–77)
Platelets: 130 10*3/uL — ABNORMAL LOW (ref 150–400)
RBC: 3.34 MIL/uL — AB (ref 3.87–5.11)
RDW: 14.5 % (ref 11.5–15.5)
WBC: 9.8 10*3/uL (ref 4.0–10.5)

## 2015-04-20 LAB — COMPREHENSIVE METABOLIC PANEL
ALBUMIN: 3.7 g/dL (ref 3.5–5.0)
ALK PHOS: 102 U/L (ref 38–126)
ALT: 43 U/L (ref 14–54)
AST: 46 U/L — AB (ref 15–41)
Anion gap: 11 (ref 5–15)
BILIRUBIN TOTAL: 1.1 mg/dL (ref 0.3–1.2)
BUN: 22 mg/dL — AB (ref 6–20)
CALCIUM: 9.2 mg/dL (ref 8.9–10.3)
CO2: 24 mmol/L (ref 22–32)
Chloride: 103 mmol/L (ref 101–111)
Creatinine, Ser: 0.84 mg/dL (ref 0.44–1.00)
GFR calc Af Amer: >60 mL/min (ref >60)
GFR calc non Af Amer: >60 mL/min (ref >60)
GLUCOSE: 188 mg/dL — AB (ref 65–99)
Potassium: 4.8 mmol/L (ref 3.5–5.1)
Sodium: 138 mmol/L (ref 135–145)
TOTAL PROTEIN: 7.3 g/dL (ref 6.5–8.1)

## 2015-04-20 LAB — GLUCOSE, CAPILLARY
GLUCOSE-CAPILLARY: 159 mg/dL — AB (ref 65–99)
GLUCOSE-CAPILLARY: 133 mg/dL — AB (ref 65–99)
GLUCOSE-CAPILLARY: 167 mg/dL — AB (ref 65–99)
GLUCOSE-CAPILLARY: 242 mg/dL — AB (ref 65–99)
Glucose-Capillary: 151 mg/dL — ABNORMAL HIGH (ref 65–99)

## 2015-04-20 LAB — SURGICAL PCR SCREEN
MRSA, PCR: NEGATIVE
STAPHYLOCOCCUS AUREUS: POSITIVE — AB

## 2015-04-20 SURGERY — INSERTION, INTRAMEDULLARY ROD, FEMUR
Anesthesia: General | Site: Hip | Laterality: Left

## 2015-04-20 MED ORDER — 0.9 % SODIUM CHLORIDE (POUR BTL) OPTIME
TOPICAL | Status: DC | PRN
  Administered 2015-04-20: 1000 mL

## 2015-04-20 MED ORDER — ONDANSETRON HCL 4 MG/2ML IJ SOLN
INTRAMUSCULAR | Status: AC
  Filled 2015-04-20: qty 2

## 2015-04-20 MED ORDER — PHENOL 1.4 % MT LIQD: 1.0000 | OROMUCOSAL | Status: DC | PRN

## 2015-04-20 MED ORDER — ALUM & MAG HYDROXIDE-SIMETH 200-200-20 MG/5ML PO SUSP: 30.0000 mL | ORAL | Status: DC | PRN

## 2015-04-20 MED ORDER — FENTANYL CITRATE (PF) 100 MCG/2ML IJ SOLN: INTRAMUSCULAR | Status: DC | PRN

## 2015-04-20 MED ORDER — FENTANYL CITRATE (PF) 100 MCG/2ML IJ SOLN
INTRAMUSCULAR | Status: DC | PRN
Start: 2015-04-20 — End: 2015-04-20
  Administered 2015-04-20 (×2): 50 ug via INTRAVENOUS

## 2015-04-20 MED ORDER — POLYETHYLENE GLYCOL 3350 17 G PO PACK: 17.0000 g | PACK | Freq: Every day | ORAL | Status: DC | PRN

## 2015-04-20 MED ORDER — HYDROMORPHONE HCL 1 MG/ML IJ SOLN
INTRAMUSCULAR | Status: AC
  Filled 2015-04-20: qty 1

## 2015-04-20 MED ORDER — FENTANYL CITRATE (PF) 250 MCG/5ML IJ SOLN
INTRAMUSCULAR | Status: AC
  Filled 2015-04-20: qty 25

## 2015-04-20 MED ORDER — ASPIRIN EC 325 MG PO TBEC
325.0000 mg | DELAYED_RELEASE_TABLET | Freq: Two times a day (BID) | ORAL | Status: DC
  Administered 2015-04-21 – 2015-04-23 (×5): 325 mg via ORAL
  Filled 2015-04-20 (×6): qty 1

## 2015-04-20 MED ORDER — ACETAMINOPHEN 650 MG RE SUPP: 650.0000 mg | Freq: Four times a day (QID) | RECTAL | Status: DC | PRN

## 2015-04-20 MED ORDER — METHOCARBAMOL 500 MG PO TABS
500.0000 mg | ORAL_TABLET | Freq: Four times a day (QID) | ORAL | Status: DC | PRN
  Administered 2015-04-22 – 2015-04-23 (×2): 500 mg via ORAL
  Filled 2015-04-20 (×2): qty 1

## 2015-04-20 MED ORDER — ONDANSETRON HCL 4 MG PO TABS
4.0000 mg | ORAL_TABLET | Freq: Four times a day (QID) | ORAL | Status: DC | PRN
  Administered 2015-04-22: 4 mg via ORAL
  Filled 2015-04-20: qty 1

## 2015-04-20 MED ORDER — FLEET ENEMA 7-19 GM/118ML RE ENEM: 1.0000 | ENEMA | Freq: Once | RECTAL | Status: DC | PRN

## 2015-04-20 MED ORDER — LACTATED RINGERS IV SOLN: INTRAVENOUS | Status: DC

## 2015-04-20 MED ORDER — METHOCARBAMOL 1000 MG/10ML IJ SOLN
500.0000 mg | Freq: Four times a day (QID) | INTRAMUSCULAR | Status: DC | PRN
  Administered 2015-04-20 – 2015-04-21 (×3): 500 mg via INTRAVENOUS
  Filled 2015-04-20 (×6): qty 5

## 2015-04-20 MED ORDER — LIDOCAINE HCL (CARDIAC) 20 MG/ML IV SOLN
INTRAVENOUS | Status: AC
Start: 2015-04-20 — End: 2015-04-20
  Filled 2015-04-20: qty 5

## 2015-04-20 MED ORDER — CEFAZOLIN SODIUM-DEXTROSE 2-3 GM-% IV SOLR
INTRAVENOUS | Status: AC
  Filled 2015-04-20: qty 50

## 2015-04-20 MED ORDER — METOCLOPRAMIDE HCL 10 MG PO TABS: 5.0000 mg | ORAL_TABLET | Freq: Three times a day (TID) | ORAL | Status: DC | PRN

## 2015-04-20 MED ORDER — LABETALOL HCL 5 MG/ML IV SOLN
5.0000 mg | INTRAVENOUS | Status: DC | PRN
  Administered 2015-04-20 (×2): 5 mg via INTRAVENOUS

## 2015-04-20 MED ORDER — CEFAZOLIN SODIUM-DEXTROSE 2-3 GM-% IV SOLR
INTRAVENOUS | Status: DC | PRN
  Administered 2015-04-20: 2 g via INTRAVENOUS

## 2015-04-20 MED ORDER — DOCUSATE SODIUM 100 MG PO CAPS
100.0000 mg | ORAL_CAPSULE | Freq: Two times a day (BID) | ORAL | Status: DC
  Administered 2015-04-20 – 2015-04-23 (×6): 100 mg via ORAL

## 2015-04-20 MED ORDER — ONDANSETRON HCL 4 MG/2ML IJ SOLN
INTRAMUSCULAR | Status: DC | PRN
  Administered 2015-04-20: 4 mg via INTRAVENOUS

## 2015-04-20 MED ORDER — EPHEDRINE SULFATE 50 MG/ML IJ SOLN
INTRAMUSCULAR | Status: DC | PRN
  Administered 2015-04-20 (×2): 5 mg via INTRAVENOUS

## 2015-04-20 MED ORDER — PROPOFOL 10 MG/ML IV BOLUS
INTRAVENOUS | Status: AC
  Filled 2015-04-20: qty 20

## 2015-04-20 MED ORDER — LABETALOL HCL 5 MG/ML IV SOLN
INTRAVENOUS | Status: AC
  Filled 2015-04-20: qty 4

## 2015-04-20 MED ORDER — DEXTROSE 5 % IV SOLN
1.0000 g | INTRAVENOUS | Status: DC
  Administered 2015-04-20 – 2015-04-21 (×2): 1 g via INTRAVENOUS
  Filled 2015-04-20 (×2): qty 10

## 2015-04-20 MED ORDER — MENTHOL 3 MG MT LOZG: 1.0000 | LOZENGE | OROMUCOSAL | Status: DC | PRN

## 2015-04-20 MED ORDER — SUCCINYLCHOLINE CHLORIDE 20 MG/ML IJ SOLN
INTRAMUSCULAR | Status: DC | PRN
  Administered 2015-04-20: 80 mg via INTRAVENOUS

## 2015-04-20 MED ORDER — ACETAMINOPHEN 325 MG PO TABS
650.0000 mg | ORAL_TABLET | Freq: Four times a day (QID) | ORAL | Status: DC | PRN
  Administered 2015-04-23: 650 mg via ORAL
  Filled 2015-04-20: qty 2

## 2015-04-20 MED ORDER — SODIUM CHLORIDE 0.9 % IV SOLN
INTRAVENOUS | Status: DC
  Administered 2015-04-20 – 2015-04-21 (×2): via INTRAVENOUS

## 2015-04-20 MED ORDER — PROPOFOL 10 MG/ML IV BOLUS
INTRAVENOUS | Status: DC | PRN
  Administered 2015-04-20: 50 mg via INTRAVENOUS
  Administered 2015-04-20: 100 mg via INTRAVENOUS
  Administered 2015-04-20: 50 mg via INTRAVENOUS

## 2015-04-20 MED ORDER — LACTATED RINGERS IV SOLN
INTRAVENOUS | Status: DC | PRN
  Administered 2015-04-20: 16:00:00 via INTRAVENOUS

## 2015-04-20 MED ORDER — LIDOCAINE HCL (CARDIAC) 20 MG/ML IV SOLN
INTRAVENOUS | Status: DC | PRN
Start: 2015-04-20 — End: 2015-04-20
  Administered 2015-04-20: 40 mg via INTRAVENOUS

## 2015-04-20 MED ORDER — ONDANSETRON HCL 4 MG/2ML IJ SOLN
4.0000 mg | Freq: Four times a day (QID) | INTRAMUSCULAR | Status: DC | PRN
  Administered 2015-04-21: 4 mg via INTRAVENOUS
  Filled 2015-04-20: qty 2

## 2015-04-20 MED ORDER — CEFAZOLIN SODIUM 1-5 GM-% IV SOLN: 1.0000 g | Freq: Four times a day (QID) | INTRAVENOUS | Status: DC

## 2015-04-20 MED ORDER — HYDROMORPHONE HCL 1 MG/ML IJ SOLN
0.2500 mg | INTRAMUSCULAR | Status: DC | PRN
  Administered 2015-04-20 (×2): 0.5 mg via INTRAVENOUS

## 2015-04-20 MED ORDER — HYDROCODONE-ACETAMINOPHEN 5-325 MG PO TABS
1.0000 | ORAL_TABLET | Freq: Four times a day (QID) | ORAL | Status: DC | PRN
  Administered 2015-04-21: 1 via ORAL
  Administered 2015-04-21 (×2): 2 via ORAL
  Filled 2015-04-20 (×3): qty 2

## 2015-04-20 MED ORDER — LORATADINE 10 MG PO TABS: 10.0000 mg | ORAL_TABLET | Freq: Every day | ORAL | Status: DC

## 2015-04-20 MED ORDER — METOCLOPRAMIDE HCL 5 MG/ML IJ SOLN: 5.0000 mg | Freq: Three times a day (TID) | INTRAMUSCULAR | Status: DC | PRN

## 2015-04-20 MED ORDER — ROCURONIUM BROMIDE 100 MG/10ML IV SOLN
INTRAVENOUS | Status: AC
Start: 2015-04-20 — End: 2015-04-20
  Filled 2015-04-20: qty 1

## 2015-04-20 SURGICAL SUPPLY — 28 items
BIT DRILL CANN LG 4.3MM (BIT) IMPLANT
BNDG GAUZE ELAST 4 BULKY (GAUZE/BANDAGES/DRESSINGS) ×3 IMPLANT
COVER PERINEAL POST (MISCELLANEOUS) ×3 IMPLANT
DRAPE STERI IOBAN 125X83 (DRAPES) ×3 IMPLANT
DRILL BIT CANN LG 4.3MM (BIT) ×3
DRSG AQUACEL AG ADV 3.5X 4 (GAUZE/BANDAGES/DRESSINGS) ×6 IMPLANT
DRSG AQUACEL AG ADV 3.5X 6 (GAUZE/BANDAGES/DRESSINGS) ×3 IMPLANT
DURAPREP 26ML APPLICATOR (WOUND CARE) ×3 IMPLANT
ELECT REM PT RETURN 9FT ADLT (ELECTROSURGICAL) ×3
ELECTRODE REM PT RTRN 9FT ADLT (ELECTROSURGICAL) ×1 IMPLANT
GLOVE BIO SURGEON STRL SZ7.5 (GLOVE) ×2 IMPLANT
GLOVE BIOGEL PI IND STRL 7.5 (GLOVE) ×1 IMPLANT
GLOVE BIOGEL PI INDICATOR 7.5 (GLOVE) ×4
GLOVE ORTHO TXT STRL SZ7.5 (GLOVE) ×4 IMPLANT
GOWN STRL REUS W/TWL XL LVL3 (GOWN DISPOSABLE) ×4 IMPLANT
GUIDEPIN 3.2X17.5 THRD DISP (PIN) ×4 IMPLANT
KIT BASIN OR (CUSTOM PROCEDURE TRAY) ×3 IMPLANT
LIQUID BAND (GAUZE/BANDAGES/DRESSINGS) ×2 IMPLANT
MANIFOLD NEPTUNE II (INSTRUMENTS) ×3 IMPLANT
NAIL HIP FRACT 130D 9X180 (Orthopedic Implant) ×2 IMPLANT
PACK GENERAL/GYN (CUSTOM PROCEDURE TRAY) ×3 IMPLANT
POSITIONER SURGICAL ARM (MISCELLANEOUS) ×3 IMPLANT
SCREW BONE CORTICAL 5.0X36 (Screw) ×2 IMPLANT
SCREW LAG 10.5MMX105MM HFN (Screw) ×2 IMPLANT
SUT MNCRL AB 4-0 PS2 18 (SUTURE) ×2 IMPLANT
SUT VIC AB 1 CT1 36 (SUTURE) ×2 IMPLANT
SUT VIC AB 2-0 CT1 27 (SUTURE) ×3
SUT VIC AB 2-0 CT1 TAPERPNT 27 (SUTURE) IMPLANT

## 2015-04-20 NOTE — Consult Note (Signed)
Reason for Consult: Left proximal femur fracture, intertrochanteric hip fracture  Referring Physician: Cruzita Lederer, MD  Tracey Mason is an 69 y.o. female.  HPI: HPI: Tracey Mason is a 69 y.o. female with history of diabetes mellitus type 2, hypertension, previous stroke, diastolic dysfunction was brought to the ER after patient had a fall at her house. X-rays reveal a left hip fracture. Patient was recently discharged from rehabilitation last week after being conservatively managed for her left patellar and left humerus fracture sustained in June of this year. Orthopedic surgeon Dr.Ollin was consulted by ER physician and plans to have surgery in a.m. On my exam patient denies any chest pain or shortness of breath headache visual symptoms nausea vomiting abdominal pain or diarrhea. Patient states she was walking with help of a walker when she suddenly lost balance and fell. Denies having hit her head or loss consciousness or did not have any dizziness or palpitations.   Seen and evaluated, no other major extremity complaints other than left hip pain    Past Medical History  Diagnosis Date  . Depression   . Hypertension   . Acid reflux   . Diverticular disease   . Rheumatoid arthritis(714.0)   . Osteoarthritis   . Bell's palsy     Left  . Hypercholesterolemia   . Myocardial infarction     ' i have been told I did "  . Diabetes mellitus     insulin dependent  . Anemia   . H/O hiatal hernia   . Stroke     mild memory loss  . Hepatitis     Hepatitis B  . Abnormality of gait 09/26/2013  . Memory deficit 09/26/2013    Past Surgical History  Procedure Laterality Date  . Appendectomy    . Tonsillectomy    . Cholecystectomy    . Abdominal hysterectomy    . Fracture surgery    . Orif tibia fracture  01/15/2012    tib /fib fracture  . Cataract extraction Bilateral   . Esophageal dilation      esophageal stricture    Family History  Problem Relation Age of Onset  . Diabetes  type II    . Coronary artery disease    . Diabetes type II Mother   . Coronary artery disease Mother   . Stroke Father   . Cancer Brother   . Cancer Sister     stomach  . Cancer Sister     colon  . Diabetes type II Brother     Social History:  reports that she quit smoking about 20 years ago. Her smoking use included Cigarettes. She has never used smokeless tobacco. She reports that she drinks about 0.5 oz of alcohol per week. She reports that she does not use illicit drugs.  Allergies:  Allergies  Allergen Reactions  . Prednisone Other (See Comments)    Causes stroke  . Cortisone Other (See Comments)    Causes stroke    Medications:  I have reviewed the patient's current medications. Scheduled: . cefTRIAXone (ROCEPHIN)  IV  1 g Intravenous Q24H  . DULoxetine  60 mg Oral Daily  . insulin aspart  0-15 Units Subcutaneous TID WC  . insulin glargine  20 Units Subcutaneous QHS  . lisinopril  10 mg Oral Daily  . loratadine  10 mg Oral Daily  . pantoprazole  40 mg Oral Daily  . sertraline  50 mg Oral Daily    Results for orders placed or performed  during the hospital encounter of 04/19/15 (from the past 48 hour(s))  CBC with Differential     Status: Abnormal   Collection Time: 04/19/15  6:14 PM  Result Value Ref Range   WBC 16.6 (H) 4.0 - 10.5 K/uL   RBC 3.75 (L) 3.87 - 5.11 MIL/uL   Hemoglobin 11.8 (L) 12.0 - 15.0 g/dL   HCT 03.7 (L) 09.6 - 43.8 %   MCV 93.3 78.0 - 100.0 fL   MCH 31.5 26.0 - 34.0 pg   MCHC 33.7 30.0 - 36.0 g/dL   RDW 38.1 84.0 - 37.5 %   Platelets 209 150 - 400 K/uL   Neutrophils Relative % 91 (H) 43 - 77 %   Neutro Abs 15.2 (H) 1.7 - 7.7 K/uL   Lymphocytes Relative 4 (L) 12 - 46 %   Lymphs Abs 0.6 (L) 0.7 - 4.0 K/uL   Monocytes Relative 5 3 - 12 %   Monocytes Absolute 0.8 0.1 - 1.0 K/uL   Eosinophils Relative 0 0 - 5 %   Eosinophils Absolute 0.0 0.0 - 0.7 K/uL   Basophils Relative 0 0 - 1 %   Basophils Absolute 0.0 0.0 - 0.1 K/uL  Basic  metabolic panel     Status: Abnormal   Collection Time: 04/19/15  6:14 PM  Result Value Ref Range   Sodium 138 135 - 145 mmol/L   Potassium 3.8 3.5 - 5.1 mmol/L   Chloride 101 101 - 111 mmol/L   CO2 28 22 - 32 mmol/L   Glucose, Bld 107 (H) 65 - 99 mg/dL   BUN 20 6 - 20 mg/dL   Creatinine, Ser 4.36 0.44 - 1.00 mg/dL   Calcium 9.6 8.9 - 06.7 mg/dL   GFR calc non Af Amer >60 >60 mL/min   GFR calc Af Amer >60 >60 mL/min    Comment: (NOTE) The eGFR has been calculated using the CKD EPI equation. This calculation has not been validated in all clinical situations. eGFR's persistently <60 mL/min signify possible Chronic Kidney Disease.    Anion gap 9 5 - 15  Protime-INR     Status: None   Collection Time: 04/19/15  6:14 PM  Result Value Ref Range   Prothrombin Time 14.3 11.6 - 15.2 seconds   INR 1.09 0.00 - 1.49  Type and screen     Status: None   Collection Time: 04/19/15  6:15 PM  Result Value Ref Range   ABO/RH(D) O POS    Antibody Screen NEG    Sample Expiration 04/22/2015   ABO/Rh     Status: None   Collection Time: 04/19/15  6:15 PM  Result Value Ref Range   ABO/RH(D) O POS   Glucose, capillary     Status: Abnormal   Collection Time: 04/19/15  7:18 PM  Result Value Ref Range   Glucose-Capillary 117 (H) 65 - 99 mg/dL  Urinalysis, Routine w reflex microscopic (not at [facility])     Status: Abnormal   Collection Time: 04/19/15  8:20 PM  Result Value Ref Range   Color, Urine YELLOW YELLOW   APPearance CLOUDY (A) CLEAR   Specific Gravity, Urine 1.021 1.005 - 1.030   pH 6.0 5.0 - 8.0   Glucose, UA NEGATIVE NEGATIVE mg/dL   Hgb urine dipstick NEGATIVE NEGATIVE   Bilirubin Urine NEGATIVE NEGATIVE   Ketones, ur NEGATIVE NEGATIVE mg/dL   Protein, ur NEGATIVE NEGATIVE mg/dL   Urobilinogen, UA 0.2 0.0 - 1.0 mg/dL   Nitrite NEGATIVE NEGATIVE  Leukocytes, UA SMALL (A) NEGATIVE  Culture, Urine     Status: None (Preliminary result)   Collection Time: 04/19/15  8:20 PM  Result Value  Ref Range   Specimen Description URINE, CLEAN CATCH    Special Requests NONE    Culture      TOO YOUNG TO READ Performed at [facility]    Report Status PENDING   Urine microscopic-add on     Status: Abnormal   Collection Time: 04/19/15  8:20 PM  Result Value Ref Range   Squamous Epithelial / LPF MANY (A) RARE   WBC, UA 0-2 <3 WBC/hpf   Bacteria, UA MANY (A) RARE  Glucose, capillary     Status: Abnormal   Collection Time: 04/19/15 10:45 PM  Result Value Ref Range   Glucose-Capillary 235 (H) 65 - 99 mg/dL  CBC WITH DIFFERENTIAL     Status: Abnormal   Collection Time: 04/20/15  5:40 AM  Result Value Ref Range   WBC 9.8 4.0 - 10.5 K/uL   RBC 3.34 (L) 3.87 - 5.11 MIL/uL   Hemoglobin 10.8 (L) 12.0 - 15.0 g/dL   HCT 31.5 (L) 36.0 - 46.0 %   MCV 94.3 78.0 - 100.0 fL   MCH 32.3 26.0 - 34.0 pg   MCHC 34.3 30.0 - 36.0 g/dL   RDW 14.5 11.5 - 15.5 %   Platelets 130 (L) 150 - 400 K/uL   Neutrophils Relative % 75 43 - 77 %   Neutro Abs 7.2 1.7 - 7.7 K/uL   Lymphocytes Relative 16 12 - 46 %   Lymphs Abs 1.6 0.7 - 4.0 K/uL   Monocytes Relative 9 3 - 12 %   Monocytes Absolute 0.9 0.1 - 1.0 K/uL   Eosinophils Relative 0 0 - 5 %   Eosinophils Absolute 0.0 0.0 - 0.7 K/uL   Basophils Relative 0 0 - 1 %   Basophils Absolute 0.0 0.0 - 0.1 K/uL  Comprehensive metabolic panel     Status: Abnormal   Collection Time: 04/20/15  5:40 AM  Result Value Ref Range   Sodium 138 135 - 145 mmol/L    Comment: REPEATED TO VERIFY   Potassium 4.8 3.5 - 5.1 mmol/L    Comment: DELTA CHECK NOTED REPEATED TO VERIFY    Chloride 103 101 - 111 mmol/L    Comment: REPEATED TO VERIFY   CO2 24 22 - 32 mmol/L    Comment: REPEATED TO VERIFY   Glucose, Bld 188 (H) 65 - 99 mg/dL   BUN 22 (H) 6 - 20 mg/dL   Creatinine, Ser 0.84 0.44 - 1.00 mg/dL   Calcium 9.2 8.9 - 10.3 mg/dL    Comment: REPEATED TO VERIFY   Total Protein 7.3 6.5 - 8.1 g/dL   Albumin 3.7 3.5 - 5.0 g/dL   AST 46 (H) 15 - 41 U/L   ALT 43  14 - 54 U/L   Alkaline Phosphatase 102 38 - 126 U/L   Total Bilirubin 1.1 0.3 - 1.2 mg/dL   GFR calc non Af Amer >60 >60 mL/min   GFR calc Af Amer >60 >60 mL/min    Comment: (NOTE) The eGFR has been calculated using the CKD EPI equation. This calculation has not been validated in all clinical situations. eGFR's persistently <60 mL/min signify possible Chronic Kidney Disease.    Anion gap 11 5 - 15    Comment: REPEATED TO VERIFY    Dg Chest Port 1 View  04/19/2015   CLINICAL DATA:  Preop for left hip fracture.  No chest complaints.  EXAM: PORTABLE CHEST - 1 VIEW  COMPARISON:  12/09/2013  FINDINGS: Shallow inspiration. Normal heart size and pulmonary vascularity. No focal airspace disease or consolidation in the lungs. No blunting of costophrenic angles. No pneumothorax. Mediastinal contours appear intact. Fracture of the left humeral neck was not definitely present on previous studies but is likely old as there appears to be some loss of distinction of the fracture line and callus formation. If patient is symptomatic in the left shoulder, left shoulder series is suggested.  IMPRESSION: No evidence of active pulmonary disease. Left humeral neck fracture, probably old. If the patient is symptomatic in this area suggest left shoulder radiographs.   Electronically Signed   By: Lucienne Capers M.D.   On: 04/19/2015 20:52   Dg Hip Unilat With Pelvis 2-3 Views Left  04/19/2015   ADDENDUM REPORT: 04/19/2015 20:41  ADDENDUM: CORRECTED REPORT:  Fracture is of the intertrochanteric left FEMUR.  Impression: Acute fracture inter trochanteric region of the left femur with  varus angulation.   Electronically Signed   By: Lucienne Capers M.D.   On: 04/19/2015 20:41   04/19/2015   CLINICAL DATA:  Fall. Hip pain, external rotation comment shortening. Trip and fall injury landing on the left hip.  EXAM: DG HIP (WITH OR WITHOUT PELVIS) 2-3V LEFT  COMPARISON:  01/26/2015  FINDINGS: Acute comminuted posttraumatic  fracture of the inter trochanteric region of the left humerus with varus angulation of the fracture fragments and mild displacement of the lesser trochanteric fragment. No dislocation of the hip. Mild degenerative changes demonstrated in the right hip. Pelvis appears intact. SI joints and symphysis pubis are not displaced.  IMPRESSION: Acute fracture inter trochanteric region of the left humerus with varus angulation.  Electronically Signed: By: Lucienne Capers M.D. On: 04/19/2015 18:26    ROS  As per admitting teams evaluation, pertinences reviewed  Review of Systems: As presented in the history of presenting illness, rest negative.  Blood pressure 152/78, pulse 84, temperature 98.5 F (36.9 C), temperature source Oral, resp. rate 16, height 5' (1.524 m), weight 58.968 kg (130 lb), SpO2 100 %. Physical Exam  Awake alert General medical exam reviewed   Left lower extremity shortened and externally rotated  pain with movement NVI LLE No upper extremity pertinent findings No right lower extremity problem   Assessment/Plan: Comminuted left intertrochanteric proximal femur fracture  NPO Consent ordered for ORIF left hip To OR today  Pricilla Loveless 04/20/2015, 8:21 AM

## 2015-04-20 NOTE — Clinical Documentation Improvement (Signed)
Internal Medicine   Is it possible to further specify this patient's diastolic dysfunction:    Diastolic dysfunction only  Chronic diastolicCHF  Acute diastolic CHF  Acute on chronic diastolic CHF  Other  Clinically Undetermined   Supporting Information: *Per H&P: female with history of.....diastolic disfunction:* *Per H&P: History of diastolic dysfunction per 2-D echo done in 2014 which showed EF of 60-65% with grade 1 diastolic dysfunction"   Please exercise your independent, professional judgment when responding. A specific answer is not anticipated or expected.   Thank You,  Cruzita Lederer RN CDS Health Information Management [place]

## 2015-04-20 NOTE — Anesthesia Procedure Notes (Signed)
Procedure Name: Intubation Date/Time: 04/20/2015 4:27 PM Performed by: Thornell Mule Pre-anesthesia Checklist: Patient identified, Emergency Drugs available, Suction available and Patient being monitored Patient Re-evaluated:Patient Re-evaluated prior to inductionOxygen Delivery Method: Circle System Utilized Preoxygenation: Pre-oxygenation with 100% oxygen Intubation Type: IV induction Ventilation: Mask ventilation without difficulty Laryngoscope Size: Miller and 3 Grade View: Grade I Tube type: Oral Tube size: 7.0 mm Number of attempts: 1 Airway Equipment and Method: Stylet and Oral airway Placement Confirmation: ETT inserted through vocal cords under direct vision,  positive ETCO2 and breath sounds checked- equal and bilateral Secured at: 20 cm Tube secured with: Tape Dental Injury: Teeth and Oropharynx as per pre-operative assessment

## 2015-04-20 NOTE — Anesthesia Preprocedure Evaluation (Addendum)
Anesthesia Evaluation  Patient identified by MRN, date of birth, ID band Patient awake    Reviewed: Allergy & Precautions, H&P , NPO status , Patient's Chart, lab work & pertinent test results  Airway Mallampati: II  TM Distance: >3 FB Neck ROM: full    Dental no notable dental hx. (+) Dental Advisory Given, Teeth Intact   Pulmonary neg pulmonary ROS, former smoker,  breath sounds clear to auscultation  Pulmonary exam normal       Cardiovascular hypertension, Pt. on medications + Past MI Normal cardiovascular examRhythm:regular Rate:Normal  Diastolic dysfunction. syncope   Neuro/Psych Depression Memory loss CVA, Residual Symptoms negative neurological ROS  negative psych ROS   GI/Hepatic negative GI ROS, GERD-  Medicated and Controlled,(+) Hepatitis -, B  Endo/Other  diabetes, Well Controlled, Type 2, Insulin Dependent  Renal/GU negative Renal ROS  negative genitourinary   Musculoskeletal  (+) Arthritis -, Rheumatoid disorders,    Abdominal   Peds  Hematology negative hematology ROS (+) anemia , hgb 10.8   Anesthesia Other Findings   Reproductive/Obstetrics negative OB ROS                           Anesthesia Physical Anesthesia Plan  ASA: III  Anesthesia Plan: General   Post-op Pain Management:    Induction: Intravenous  Airway Management Planned: Oral ETT  Additional Equipment:   Intra-op Plan:   Post-operative Plan: Extubation in OR  Informed Consent: I have reviewed the patients History and Physical, chart, labs and discussed the procedure including the risks, benefits and alternatives for the proposed anesthesia with the patient or authorized representative who has indicated his/her understanding and acceptance.   Dental Advisory Given  Plan Discussed with: CRNA and Surgeon  Anesthesia Plan Comments:         Anesthesia Quick Evaluation

## 2015-04-20 NOTE — Brief Op Note (Signed)
04/19/2015 - 04/20/2015  4:14 PM  PATIENT:  Tracey Mason  69 y.o. female  PRE-OPERATIVE DIAGNOSIS:  Left Intertrochanteric fracture  POST-OPERATIVE DIAGNOSIS:  Comminuted left Intertrochanteric fracture  PROCEDURE:  Procedure(s): LEFT INTRAMEDULLARY (IM) NAIL FEMORAL  ORIF left proximal femur  SURGEON:  Surgeon(s) and Role:    * Durene Romans, MD - Primary  PHYSICIAN ASSISTANT: None  ANESTHESIA:   general  EBL:  Total I/O In: 148.2 [I.V.:98.2; IV Piggyback:50] Out: 500 [Urine:500]  BLOOD ADMINISTERED:none  DRAINS: none   LOCAL MEDICATIONS USED:  NONE  SPECIMEN:  No Specimen  DISPOSITION OF SPECIMEN:  N/A  COUNTS:  YES  TOURNIQUET:  * No tourniquets in log *  DICTATION: .Other Dictation: Dictation Number [id number]  PLAN OF CARE: Admit to inpatient   PATIENT DISPOSITION:  PACU - hemodynamically stable.   Delay start of Pharmacological VTE agent (>24hrs) due to surgical blood loss or risk of bleeding: no

## 2015-04-20 NOTE — Anesthesia Postprocedure Evaluation (Signed)
  Anesthesia Post-op Note  Patient: Tracey Mason  Procedure(s) Performed: Procedure(s) (LRB): LEFT HIP INTRAMEDULLARY NAIL FEMORAL  (Left)  Patient Location: PACU  Anesthesia Type: General  Level of Consciousness: awake and alert   Airway and Oxygen Therapy: Patient Spontanous Breathing  Post-op Pain: mild  Post-op Assessment: Post-op Vital signs reviewed, Patient's Cardiovascular Status Stable, Respiratory Function Stable, Patent Airway and No signs of Nausea or vomiting  Last Vitals:  Filed Vitals:   04/20/15 1844  BP: 144/59  Pulse: 89  Temp: 36.6 C  Resp: 16    Post-op Vital Signs: stable   Complications: No apparent anesthesia complications

## 2015-04-20 NOTE — Interval H&P Note (Signed)
History and Physical Interval Note:  04/20/2015 4:14 PM  Tracey Mason  has presented today for surgery, with the diagnosis of Left Intertrochanteric Fracture  The various methods of treatment have been discussed with the patient and family. After consideration of risks, benefits and other options for treatment, the patient has consented to  Procedure(s): LEFT INTRAMEDULLARY (IM) NAIL FEMORAL  BIOMET (Left) as a surgical intervention .  The patient's history has been reviewed, patient examined, no change in status, stable for surgery.  I have reviewed the patient's chart and labs.  Questions were answered to the patient's satisfaction.     Shelda Pal

## 2015-04-20 NOTE — Progress Notes (Signed)
Initial Nutrition Assessment  DOCUMENTATION CODES:   Not applicable  INTERVENTION:  None at this time.   NUTRITION DIAGNOSIS:   Inadequate oral intake related to inability to eat as evidenced by NPO status (d/t impending surgery.).    GOAL:   Patient will meet greater than or equal to 90% of their needs    MONITOR:   PO intake, Skin, I & O's, Weight trends, Labs  REASON FOR ASSESSMENT:   Consult Hip fracture protocol  ASSESSMENT:   Pt presents with broken leg h/o HTN, DM2, previous stroke, diastolic dysfunction. Consulted for hip fracture protocol. Pt exhibits no signs of malnutrition, or compromised nutritional status. Monitor PO intake post-surgery, labs, weight trends.    Diet Order:  Diet NPO time specified  Skin:  Reviewed, no issues  Last BM:  04/19/2015  Height:   Ht Readings from Last 1 Encounters:  04/19/15 5' (1.524 m)    Weight:   Wt Readings from Last 1 Encounters:  04/19/15 130 lb (58.968 kg)    Ideal Body Weight:  45.45 kg  BMI:  Body mass index is 25.39 kg/(m^2).   EDUCATION NEEDS:   No education needs identified at this time  Dionne Ano. Rosiland Sen, MS, RD LDN After Hours/Weekend Pager [phone]

## 2015-04-20 NOTE — H&P (View-Only) (Signed)
Reason for Consult: Left proximal femur fracture, intertrochanteric hip fracture  Referring Physician: Cruzita Lederer, MD  Tracey Mason is an 69 y.o. female.  HPI: HPI: Tracey Mason is a 69 y.o. female with history of diabetes mellitus type 2, hypertension, previous stroke, diastolic dysfunction was brought to the ER after patient had a fall at her house. X-rays reveal a left hip fracture. Patient was recently discharged from rehabilitation last week after being conservatively managed for her left patellar and left humerus fracture sustained in June of this year. Orthopedic surgeon Dr.Ollin was consulted by ER physician and plans to have surgery in a.m. On my exam patient denies any chest pain or shortness of breath headache visual symptoms nausea vomiting abdominal pain or diarrhea. Patient states she was walking with help of a walker when she suddenly lost balance and fell. Denies having hit her head or loss consciousness or did not have any dizziness or palpitations.   Seen and evaluated, no other major extremity complaints other than left hip pain    Past Medical History  Diagnosis Date  . Depression   . Hypertension   . Acid reflux   . Diverticular disease   . Rheumatoid arthritis(714.0)   . Osteoarthritis   . Bell's palsy     Left  . Hypercholesterolemia   . Myocardial infarction     ' i have been told I did "  . Diabetes mellitus     insulin dependent  . Anemia   . H/O hiatal hernia   . Stroke     mild memory loss  . Hepatitis     Hepatitis B  . Abnormality of gait 09/26/2013  . Memory deficit 09/26/2013    Past Surgical History  Procedure Laterality Date  . Appendectomy    . Tonsillectomy    . Cholecystectomy    . Abdominal hysterectomy    . Fracture surgery    . Orif tibia fracture  01/15/2012    tib /fib fracture  . Cataract extraction Bilateral   . Esophageal dilation      esophageal stricture    Family History  Problem Relation Age of Onset  . Diabetes  type II    . Coronary artery disease    . Diabetes type II Mother   . Coronary artery disease Mother   . Stroke Father   . Cancer Brother   . Cancer Sister     stomach  . Cancer Sister     colon  . Diabetes type II Brother     Social History:  reports that she quit smoking about 20 years ago. Her smoking use included Cigarettes. She has never used smokeless tobacco. She reports that she drinks about 0.5 oz of alcohol per week. She reports that she does not use illicit drugs.  Allergies:  Allergies  Allergen Reactions  . Prednisone Other (See Comments)    Causes stroke  . Cortisone Other (See Comments)    Causes stroke    Medications:  I have reviewed the patient's current medications. Scheduled: . cefTRIAXone (ROCEPHIN)  IV  1 g Intravenous Q24H  . DULoxetine  60 mg Oral Daily  . insulin aspart  0-15 Units Subcutaneous TID WC  . insulin glargine  20 Units Subcutaneous QHS  . lisinopril  10 mg Oral Daily  . loratadine  10 mg Oral Daily  . pantoprazole  40 mg Oral Daily  . sertraline  50 mg Oral Daily    Results for orders placed or performed  during the hospital encounter of 04/19/15 (from the past 48 hour(s))  CBC with Differential     Status: Abnormal   Collection Time: 04/19/15  6:14 PM  Result Value Ref Range   WBC 16.6 (H) 4.0 - 10.5 K/uL   RBC 3.75 (L) 3.87 - 5.11 MIL/uL   Hemoglobin 11.8 (L) 12.0 - 15.0 g/dL   HCT 33.5 (L) 48.6 - 18.6 %   MCV 93.3 78.0 - 100.0 fL   MCH 31.5 26.0 - 34.0 pg   MCHC 33.7 30.0 - 36.0 g/dL   RDW 64.1 46.1 - 37.9 %   Platelets 209 150 - 400 K/uL   Neutrophils Relative % 91 (H) 43 - 77 %   Neutro Abs 15.2 (H) 1.7 - 7.7 K/uL   Lymphocytes Relative 4 (L) 12 - 46 %   Lymphs Abs 0.6 (L) 0.7 - 4.0 K/uL   Monocytes Relative 5 3 - 12 %   Monocytes Absolute 0.8 0.1 - 1.0 K/uL   Eosinophils Relative 0 0 - 5 %   Eosinophils Absolute 0.0 0.0 - 0.7 K/uL   Basophils Relative 0 0 - 1 %   Basophils Absolute 0.0 0.0 - 0.1 K/uL  Basic  metabolic panel     Status: Abnormal   Collection Time: 04/19/15  6:14 PM  Result Value Ref Range   Sodium 138 135 - 145 mmol/L   Potassium 3.8 3.5 - 5.1 mmol/L   Chloride 101 101 - 111 mmol/L   CO2 28 22 - 32 mmol/L   Glucose, Bld 107 (H) 65 - 99 mg/dL   BUN 20 6 - 20 mg/dL   Creatinine, Ser 5.65 0.44 - 1.00 mg/dL   Calcium 9.6 8.9 - 96.3 mg/dL   GFR calc non Af Amer >60 >60 mL/min   GFR calc Af Amer >60 >60 mL/min    Comment: (NOTE) The eGFR has been calculated using the CKD EPI equation. This calculation has not been validated in all clinical situations. eGFR's persistently <60 mL/min signify possible Chronic Kidney Disease.    Anion gap 9 5 - 15  Protime-INR     Status: None   Collection Time: 04/19/15  6:14 PM  Result Value Ref Range   Prothrombin Time 14.3 11.6 - 15.2 seconds   INR 1.09 0.00 - 1.49  Type and screen     Status: None   Collection Time: 04/19/15  6:15 PM  Result Value Ref Range   ABO/RH(D) O POS    Antibody Screen NEG    Sample Expiration 04/22/2015   ABO/Rh     Status: None   Collection Time: 04/19/15  6:15 PM  Result Value Ref Range   ABO/RH(D) O POS   Glucose, capillary     Status: Abnormal   Collection Time: 04/19/15  7:18 PM  Result Value Ref Range   Glucose-Capillary 117 (H) 65 - 99 mg/dL  Urinalysis, Routine w reflex microscopic (not at [facility])     Status: Abnormal   Collection Time: 04/19/15  8:20 PM  Result Value Ref Range   Color, Urine YELLOW YELLOW   APPearance CLOUDY (A) CLEAR   Specific Gravity, Urine 1.021 1.005 - 1.030   pH 6.0 5.0 - 8.0   Glucose, UA NEGATIVE NEGATIVE mg/dL   Hgb urine dipstick NEGATIVE NEGATIVE   Bilirubin Urine NEGATIVE NEGATIVE   Ketones, ur NEGATIVE NEGATIVE mg/dL   Protein, ur NEGATIVE NEGATIVE mg/dL   Urobilinogen, UA 0.2 0.0 - 1.0 mg/dL   Nitrite NEGATIVE NEGATIVE  Leukocytes, UA SMALL (A) NEGATIVE  Culture, Urine     Status: None (Preliminary result)   Collection Time: 04/19/15  8:20 PM  Result Value  Ref Range   Specimen Description URINE, CLEAN CATCH    Special Requests NONE    Culture      TOO YOUNG TO READ Performed at [facility]    Report Status PENDING   Urine microscopic-add on     Status: Abnormal   Collection Time: 04/19/15  8:20 PM  Result Value Ref Range   Squamous Epithelial / LPF MANY (A) RARE   WBC, UA 0-2 <3 WBC/hpf   Bacteria, UA MANY (A) RARE  Glucose, capillary     Status: Abnormal   Collection Time: 04/19/15 10:45 PM  Result Value Ref Range   Glucose-Capillary 235 (H) 65 - 99 mg/dL  CBC WITH DIFFERENTIAL     Status: Abnormal   Collection Time: 04/20/15  5:40 AM  Result Value Ref Range   WBC 9.8 4.0 - 10.5 K/uL   RBC 3.34 (L) 3.87 - 5.11 MIL/uL   Hemoglobin 10.8 (L) 12.0 - 15.0 g/dL   HCT 31.5 (L) 36.0 - 46.0 %   MCV 94.3 78.0 - 100.0 fL   MCH 32.3 26.0 - 34.0 pg   MCHC 34.3 30.0 - 36.0 g/dL   RDW 14.5 11.5 - 15.5 %   Platelets 130 (L) 150 - 400 K/uL   Neutrophils Relative % 75 43 - 77 %   Neutro Abs 7.2 1.7 - 7.7 K/uL   Lymphocytes Relative 16 12 - 46 %   Lymphs Abs 1.6 0.7 - 4.0 K/uL   Monocytes Relative 9 3 - 12 %   Monocytes Absolute 0.9 0.1 - 1.0 K/uL   Eosinophils Relative 0 0 - 5 %   Eosinophils Absolute 0.0 0.0 - 0.7 K/uL   Basophils Relative 0 0 - 1 %   Basophils Absolute 0.0 0.0 - 0.1 K/uL  Comprehensive metabolic panel     Status: Abnormal   Collection Time: 04/20/15  5:40 AM  Result Value Ref Range   Sodium 138 135 - 145 mmol/L    Comment: REPEATED TO VERIFY   Potassium 4.8 3.5 - 5.1 mmol/L    Comment: DELTA CHECK NOTED REPEATED TO VERIFY    Chloride 103 101 - 111 mmol/L    Comment: REPEATED TO VERIFY   CO2 24 22 - 32 mmol/L    Comment: REPEATED TO VERIFY   Glucose, Bld 188 (H) 65 - 99 mg/dL   BUN 22 (H) 6 - 20 mg/dL   Creatinine, Ser 0.84 0.44 - 1.00 mg/dL   Calcium 9.2 8.9 - 10.3 mg/dL    Comment: REPEATED TO VERIFY   Total Protein 7.3 6.5 - 8.1 g/dL   Albumin 3.7 3.5 - 5.0 g/dL   AST 46 (H) 15 - 41 U/L   ALT 43  14 - 54 U/L   Alkaline Phosphatase 102 38 - 126 U/L   Total Bilirubin 1.1 0.3 - 1.2 mg/dL   GFR calc non Af Amer >60 >60 mL/min   GFR calc Af Amer >60 >60 mL/min    Comment: (NOTE) The eGFR has been calculated using the CKD EPI equation. This calculation has not been validated in all clinical situations. eGFR's persistently <60 mL/min signify possible Chronic Kidney Disease.    Anion gap 11 5 - 15    Comment: REPEATED TO VERIFY    Dg Chest Port 1 View  04/19/2015   CLINICAL DATA:  Preop for left hip fracture.  No chest complaints.  EXAM: PORTABLE CHEST - 1 VIEW  COMPARISON:  12/09/2013  FINDINGS: Shallow inspiration. Normal heart size and pulmonary vascularity. No focal airspace disease or consolidation in the lungs. No blunting of costophrenic angles. No pneumothorax. Mediastinal contours appear intact. Fracture of the left humeral neck was not definitely present on previous studies but is likely old as there appears to be some loss of distinction of the fracture line and callus formation. If patient is symptomatic in the left shoulder, left shoulder series is suggested.  IMPRESSION: No evidence of active pulmonary disease. Left humeral neck fracture, probably old. If the patient is symptomatic in this area suggest left shoulder radiographs.   Electronically Signed   By: Lucienne Capers M.D.   On: 04/19/2015 20:52   Dg Hip Unilat With Pelvis 2-3 Views Left  04/19/2015   ADDENDUM REPORT: 04/19/2015 20:41  ADDENDUM: CORRECTED REPORT:  Fracture is of the intertrochanteric left FEMUR.  Impression: Acute fracture inter trochanteric region of the left femur with  varus angulation.   Electronically Signed   By: Lucienne Capers M.D.   On: 04/19/2015 20:41   04/19/2015   CLINICAL DATA:  Fall. Hip pain, external rotation comment shortening. Trip and fall injury landing on the left hip.  EXAM: DG HIP (WITH OR WITHOUT PELVIS) 2-3V LEFT  COMPARISON:  01/26/2015  FINDINGS: Acute comminuted posttraumatic  fracture of the inter trochanteric region of the left humerus with varus angulation of the fracture fragments and mild displacement of the lesser trochanteric fragment. No dislocation of the hip. Mild degenerative changes demonstrated in the right hip. Pelvis appears intact. SI joints and symphysis pubis are not displaced.  IMPRESSION: Acute fracture inter trochanteric region of the left humerus with varus angulation.  Electronically Signed: By: Lucienne Capers M.D. On: 04/19/2015 18:26    ROS  As per admitting teams evaluation, pertinences reviewed  Review of Systems: As presented in the history of presenting illness, rest negative.  Blood pressure 152/78, pulse 84, temperature 98.5 F (36.9 C), temperature source Oral, resp. rate 16, height 5' (1.524 m), weight 58.968 kg (130 lb), SpO2 100 %. Physical Exam  Awake alert General medical exam reviewed   Left lower extremity shortened and externally rotated  pain with movement NVI LLE No upper extremity pertinent findings No right lower extremity problem   Assessment/Plan: Comminuted left intertrochanteric proximal femur fracture  NPO Consent ordered for ORIF left hip To OR today  Pricilla Loveless 04/20/2015, 8:21 AM

## 2015-04-20 NOTE — Progress Notes (Signed)
Labetalol given per Dr Leta Jungling

## 2015-04-20 NOTE — Progress Notes (Signed)
PROGRESS NOTE  Tracey Mason:[id number] DOB: Jul 09, 1946 DOA: 04/19/2015 PCP: Minda Meo, MD   HPI: Tracey Mason is a 69 y.o. female with history of diabetes mellitus type 2, hypertension, previous stroke, diastolic dysfunction was brought to the ER on 8/25 after patient had a fall at her house. X-rays revealed a left hip fracture and was admitted on medicine service with ortho consult.   Subjective / 24 H Interval events - endorses hip pain with movement this morning - no chest pain, shortness of breath, no abdominal pain, nausea or vomiting.   Assessment/Plan: Principal Problem:   Hip fracture, left Active Problems:   Hypertension   Closed left hip fracture   Diabetes mellitus type 2, uncontrolled   Chronic anemia    Left hip fracture status post mechanical fall  - from medical standpoint of view patient is moderate risk for intermediate risk procedure.  - NPO, ortho to evaluate, plan for OR today   Diabetes mellitus type 2  - patient is usually on Lantus 50 units at bedtime since patient will remain nothing by mouth after midnight and have decreased the Lantus dose to 20 units. - closely monitor post op, reinstate higher doses once she is eating  Hypertension  - on lisinopril.  Leukocytosis  - likely reactive, now normalized  Probable UTI - patient with significant dysuria prior to admission and UA borderline - Ceftriaxone, cultures pending  History of CVA  - on aspirin. Need to restart after surgery once okay with surgeon.  Recent left humerus and left patellar fracture  - managed conservatively.  Chronic diastolic dysfunction  - per 2-D echo done in 2014 which showed EF of 60-65% with grade 1 diastolic dysfunction.  - Patient appears compensated at this time.  Prolonged QTC in the EKG of 500 ms.  - avoid QTC prolonging medications.   Diet: Diet NPO time specified Fluids: None DVT Prophylaxis: SCD  Code Status: DNR Family  Communication: d/w daughter bedside  Disposition Plan: remain inpatient  Consultants:  Orthopedic surgery   Procedures:  None    Antibiotics Ceftriaxone 8/26 >>   Studies  Dg Chest Port 1 View  04/19/2015   CLINICAL DATA:  Preop for left hip fracture.  No chest complaints.  EXAM: PORTABLE CHEST - 1 VIEW  COMPARISON:  12/09/2013  FINDINGS: Shallow inspiration. Normal heart size and pulmonary vascularity. No focal airspace disease or consolidation in the lungs. No blunting of costophrenic angles. No pneumothorax. Mediastinal contours appear intact. Fracture of the left humeral neck was not definitely present on previous studies but is likely old as there appears to be some loss of distinction of the fracture line and callus formation. If patient is symptomatic in the left shoulder, left shoulder series is suggested.  IMPRESSION: No evidence of active pulmonary disease. Left humeral neck fracture, probably old. If the patient is symptomatic in this area suggest left shoulder radiographs.   Electronically Signed   By: Burman Nieves M.D.   On: 04/19/2015 20:52   Dg Hip Unilat With Pelvis 2-3 Views Left  04/19/2015   ADDENDUM REPORT: 04/19/2015 20:41  ADDENDUM: CORRECTED REPORT:  Fracture is of the intertrochanteric left FEMUR.  Impression: Acute fracture inter trochanteric region of the left femur with  varus angulation.   Electronically Signed   By: Burman Nieves M.D.   On: 04/19/2015 20:41   04/19/2015   CLINICAL DATA:  Fall. Hip pain, external rotation comment shortening. Trip and fall injury landing on the left  hip.  EXAM: DG HIP (WITH OR WITHOUT PELVIS) 2-3V LEFT  COMPARISON:  01/26/2015  FINDINGS: Acute comminuted posttraumatic fracture of the inter trochanteric region of the left humerus with varus angulation of the fracture fragments and mild displacement of the lesser trochanteric fragment. No dislocation of the hip. Mild degenerative changes demonstrated in the right hip. Pelvis  appears intact. SI joints and symphysis pubis are not displaced.  IMPRESSION: Acute fracture inter trochanteric region of the left humerus with varus angulation.  Electronically Signed: By: Burman Nieves M.D. On: 04/19/2015 18:26    Objective  Filed Vitals:   04/19/15 2320 04/20/15 0013 04/20/15 0135 04/20/15 0551  BP: 146/61 134/67 128/65 152/78  Pulse: 105 101 94 84  Temp: 99 F (37.2 C) 100.6 F (38.1 C) 98.1 F (36.7 C) 98.5 F (36.9 C)  TempSrc: Oral Axillary Oral Oral  Resp: 16 16 15 16   Height:      Weight:      SpO2: 98% 98% 99% 100%    Intake/Output Summary (Last 24 hours) at 04/20/15 1143 Last data filed at 04/20/15 1000  Gross per 24 hour  Intake    130 ml  Output    375 ml  Net   -245 ml   Filed Weights   04/19/15 1734  Weight: 58.968 kg (130 lb)    Exam:  GENERAL: NAD  HEENT: head NCAT, no scleral icterus. Pupils round and reactive. Mucous membranes are moist.  NECK: Supple.   LUNGS: Clear to auscultation. No wheezing or crackles  HEART: Regular rate and rhythm without murmur. 2+ pulses, no JVD, no peripheral edema  ABDOMEN: Soft, nontender, and nondistended. Positive bowel sounds.   EXTREMITIES: Without any cyanosis, clubbing, rash, lesions or edema.  NEUROLOGIC: Alert and oriented x3. Strength 5/5 in all 4.  Data Reviewed: Basic Metabolic Panel:  Recent Labs Lab 04/19/15 1814 04/20/15 0540  NA 138 138  K 3.8 4.8  CL 101 103  CO2 28 24  GLUCOSE 107* 188*  BUN 20 22*  CREATININE 0.88 0.84  CALCIUM 9.6 9.2   Liver Function Tests:  Recent Labs Lab 04/20/15 0540  AST 46*  ALT 43  ALKPHOS 102  BILITOT 1.1  PROT 7.3  ALBUMIN 3.7   CBC:  Recent Labs Lab 04/19/15 1814 04/20/15 0540  WBC 16.6* 9.8  NEUTROABS 15.2* 7.2  HGB 11.8* 10.8*  HCT 35.0* 31.5*  MCV 93.3 94.3  PLT 209 130*   CBG:  Recent Labs Lab 04/19/15 1918 04/19/15 2245 04/20/15 0835  GLUCAP 117* 235* 159*    Recent Results (from the past 240  hour(s))  Culture, Urine     Status: None (Preliminary result)   Collection Time: 04/19/15  8:20 PM  Result Value Ref Range Status   Specimen Description URINE, CLEAN CATCH  Final   Special Requests NONE  Final   Culture   Final    TOO YOUNG TO READ Performed at [facility]    Report Status PENDING  Incomplete     Scheduled Meds: . cefTRIAXone (ROCEPHIN)  IV  1 g Intravenous Q24H  . DULoxetine  60 mg Oral Daily  . insulin aspart  0-15 Units Subcutaneous TID WC  . insulin glargine  20 Units Subcutaneous QHS  . lisinopril  10 mg Oral Daily  . loratadine  10 mg Oral Daily  . pantoprazole  40 mg Oral Daily  . sertraline  50 mg Oral Daily   Continuous Infusions: . sodium chloride 10 mL/hr at  04/19/15 2258    Pamella Pert, MD [facility] Pager [phone]. If 7 PM - 7 AM, please contact night-coverage at [email or web address], password [facility] 04/20/2015, 11:43 AM  LOS: 1 day

## 2015-04-20 NOTE — Progress Notes (Signed)
CSW met with pt / daughter this am to assist with d/c planning. Pt will have hip surgery today. Pt / daughter feel [facility] Rehab may be needed at d/c. Pt lives in [facility] community. Pt has recently been d/c from [facility] where she was receiving rehab. CSW has initiated SNF search and bed offers are pending. Pt has approximately 40 days left of [employer] medicare coverage. CSW will continue to follow to assist with d/c planning.  Werner Lean LCSW [phone]

## 2015-04-20 NOTE — Clinical Social Work Placement (Signed)
   CLINICAL SOCIAL WORK PLACEMENT  NOTE  Date:  04/20/2015  Patient Details  Name: Tracey Mason MRN: [id number] Date of Birth: 26-May-1946  Clinical Social Work is seeking post-discharge placement for this patient at the Skilled  Nursing Facility level of care (*CSW will initial, date and re-position this form in  chart as items are completed):  Yes   Patient/family provided with [facility] Clinical Social Work Department's list of facilities offering this level of care within the geographic area requested by the patient (or if unable, by the patient's family).  Yes   Patient/family informed of their freedom to choose among providers that offer the needed level of care, that participate in Medicare, Medicaid or managed care program needed by the patient, have an available bed and are willing to accept the patient.  Yes   Patient/family informed of [facility]'s ownership interest in [facility] and [facility], as well as of the fact that they are under no obligation to receive care at these facilities.  PASRR submitted to EDS on       PASRR number received on       Existing PASRR number confirmed on 04/20/15     FL2 transmitted to all facilities in geographic area requested by pt/family on 04/20/15     FL2 transmitted to all facilities within larger geographic area on       Patient informed that his/her managed care company has contracts with or will negotiate with certain facilities, including the following:            Patient/family informed of bed offers received.  Patient chooses bed at       Physician recommends and patient chooses bed at      Patient to be transferred to   on  .  Patient to be transferred to facility by       Patient family notified on   of transfer.  Name of family member notified:        PHYSICIAN       Additional Comment:    _______________________________________________ Royetta Asal, [place] [phone] 04/20/2015, 2:37  PM

## 2015-04-20 NOTE — Transfer of Care (Signed)
Immediate Anesthesia Transfer of Care Note  Patient: Tracey Mason  Procedure(s) Performed: Procedure(s): LEFT HIP INTRAMEDULLARY NAIL FEMORAL  (Left)  Patient Location: PACU  Anesthesia Type:General  Level of Consciousness: awake, alert  and oriented  Airway & Oxygen Therapy: Patient Spontanous Breathing and Patient connected to face mask oxygen  Post-op Assessment: Report given to RN and Post -op Vital signs reviewed and stable  Post vital signs: Reviewed and stable  Last Vitals:  Filed Vitals:   04/20/15 1541  BP: 157/66  Pulse: 86  Temp: 36.8 C  Resp: 16    Complications: No apparent anesthesia complications

## 2015-04-21 DIAGNOSIS — I10 Essential (primary) hypertension: Secondary | ICD-10-CM

## 2015-04-21 DIAGNOSIS — N3000 Acute cystitis without hematuria: Secondary | ICD-10-CM

## 2015-04-21 DIAGNOSIS — D62 Acute posthemorrhagic anemia: Secondary | ICD-10-CM

## 2015-04-21 LAB — URINE CULTURE: Culture: >=100000

## 2015-04-21 LAB — BASIC METABOLIC PANEL
Anion gap: 7 (ref 5–15)
BUN: 18 mg/dL (ref 6–20)
CALCIUM: 8.7 mg/dL — AB (ref 8.9–10.3)
CO2: 28 mmol/L (ref 22–32)
CREATININE: 0.77 mg/dL (ref 0.44–1.00)
Chloride: 103 mmol/L (ref 101–111)
GFR calc Af Amer: >60 mL/min (ref >60)
GLUCOSE: 191 mg/dL — AB (ref 65–99)
POTASSIUM: 4.2 mmol/L (ref 3.5–5.1)
Sodium: 138 mmol/L (ref 135–145)

## 2015-04-21 LAB — CBC
HCT: 26.6 % — ABNORMAL LOW (ref 36.0–46.0)
Hemoglobin: 8.9 g/dL — ABNORMAL LOW (ref 12.0–15.0)
MCH: 32 pg (ref 26.0–34.0)
MCHC: 33.5 g/dL (ref 30.0–36.0)
MCV: 95.7 fL (ref 78.0–100.0)
PLATELETS: 164 10*3/uL (ref 150–400)
RBC: 2.78 MIL/uL — ABNORMAL LOW (ref 3.87–5.11)
RDW: 14.4 % (ref 11.5–15.5)
WBC: 9.2 10*3/uL (ref 4.0–10.5)

## 2015-04-21 LAB — GLUCOSE, CAPILLARY
GLUCOSE-CAPILLARY: 156 mg/dL — AB (ref 65–99)
GLUCOSE-CAPILLARY: 129 mg/dL — AB (ref 65–99)
Glucose-Capillary: 177 mg/dL — ABNORMAL HIGH (ref 65–99)
Glucose-Capillary: 183 mg/dL — ABNORMAL HIGH (ref 65–99)
Glucose-Capillary: 236 mg/dL — ABNORMAL HIGH (ref 65–99)

## 2015-04-21 MED ORDER — INSULIN GLARGINE 100 UNIT/ML ~~LOC~~ SOLN
30.0000 [IU] | Freq: Every day | SUBCUTANEOUS | Status: DC
  Administered 2015-04-21: 30 [IU] via SUBCUTANEOUS
  Administered 2015-04-22: 15 [IU] via SUBCUTANEOUS
  Filled 2015-04-21 (×3): qty 0.3

## 2015-04-21 MED ORDER — CEPHALEXIN 500 MG PO CAPS
500.0000 mg | ORAL_CAPSULE | Freq: Three times a day (TID) | ORAL | Status: DC
  Administered 2015-04-22 – 2015-04-23 (×5): 500 mg via ORAL
  Filled 2015-04-21 (×7): qty 1

## 2015-04-21 NOTE — Progress Notes (Signed)
   Subjective: 1 Day Post-Op Procedure(s) (LRB): LEFT HIP INTRAMEDULLARY NAIL FEMORAL  (Left)  Pt c/o moderate pain in the left hip this morning Pain with any movement of the leg or moving in bed Denies any new symptoms or issues Patient reports pain as moderate.  Objective:   VITALS:   Filed Vitals:   04/21/15 0500  BP: 137/54  Pulse: 104  Temp: 100.3 F (37.9 C)  Resp: 19    Left hip incisions healing well nv intact distally No rashes or edema  LABS  Recent Labs  04/19/15 1814 04/20/15 0540 04/21/15 0435  HGB 11.8* 10.8* 8.9*  HCT 35.0* 31.5* 26.6*  WBC 16.6* 9.8 9.2  PLT 209 130* 164     Recent Labs  04/19/15 1814 04/20/15 0540 04/21/15 0435  NA 138 138 138  K 3.8 4.8 4.2  BUN 20 22* 18  CREATININE 0.88 0.84 0.77  GLUCOSE 107* 188* 191*     Assessment/Plan: 1 Day Post-Op Procedure(s) (LRB): LEFT HIP INTRAMEDULLARY NAIL FEMORAL  (Left) Currently hemeglobin 8.9 but will watch for further drop D/c planning - suspect to SNF  PT/OT as able Pulmonary toilet Pain management   Alphonsa Overall, MPAS, PA-C  04/21/2015, 7:45 AM

## 2015-04-21 NOTE — Progress Notes (Signed)
Occupational Therapy Evaluation Patient Details Name: Tracey Mason MRN: [id number] DOB: Jan 28, 1946 Today's Date: 04/21/2015    History of Present Illness 69 yo female adm with hip fx, s/p IM nail L hip   Clinical Impression   PTA, pt recently moved to Independent Living apt (4 days) at [facility] after rehab at [facility]. Daughter assisting with ADL as needed and pt mod I with mobility @ w/c level. Pt will benefit from rehab at SNF to maximize functional level of independence. Will follow acutely to address established goals. Daughter had many questions regarding long term care and finances.     Follow Up Recommendations  SNF;Supervision/Assistance - 24 hour    Equipment Recommendations  None recommended by OT    Recommendations for Other Services       Precautions / Restrictions Precautions Precautions: Fall Restrictions Weight Bearing Restrictions: Yes LLE Weight Bearing: Partial weight bearing LLE Partial Weight Bearing Percentage or Pounds: 50%      Mobility Bed Mobility Overal bed mobility: +2 for physical assistance;Needs Assistance             General bed mobility comments: unable to tolerate any bed mobility due to pain  Transfers                 General transfer comment: unable to assess. Will need +2`    Balance Overall balance assessment: History of Falls (pt with posterior lean . Easily loses balance. multiple fall)                                          ADL Overall ADL's : Needs assistance/impaired     Grooming: Set up;Bed level   Upper Body Bathing: Set up;Bed level   Lower Body Bathing: Maximal assistance;Bed level   Upper Body Dressing : Minimal assistance;Bed level   Lower Body Dressing: Maximal assistance;Bed level               Functional mobility during ADLs:  (unable to assess at this time due to pain) General ADL Comments: limited eval due to pain. Seen earlier by PT and was only able to sit EOB. Long  discussion with daughter after visit. Daughter asking about realistic expectations of returning to independent living after SNF. Recomended to take advantage of rehab and meet with SNF therapists early in rehab process to discuss her conerns regarding her mother's recurrent falls and her mother's apparent decrease in judgement/insight/awareness/safety. Pt states she wants to be independent.                      Pertinent Vitals/Pain Pain Assessment: 0-10 Pain Score: 9  Pain Location: L hip Pain Descriptors / Indicators: Grimacing;Aching;Shooting     Hand Dominance     Extremity/Trunk Assessment Upper Extremity Assessment Upper Extremity Assessment: Overall WFL for tasks assessed   Lower Extremity Assessment Lower Extremity Assessment: Defer to PT evaluation       Communication     Cognition Arousal/Alertness: Awake/alert Behavior During Therapy: WFL for tasks assessed/performed Overall Cognitive Status: History of cognitive impairments - at baseline       Memory: Decreased short-term memory  Daughter states that her mother "makes poor choices" and thinks she "can do more than she can". Pt with apparent deficits regarding insight/judgement and awareness. These factors increase her risk for falls.  General Comments       Exercises       Shoulder Instructions      Home Living Family/patient expects to be discharged to:: Skilled nursing facility Living Arrangements: Alone                           Home Equipment: Wheelchair - manual;Bedside commode;Walker - 2 wheels   Additional Comments: pt was at [name] x 2 mos; has been at [facility] for only a few days; functioning from w/c level      Prior Functioning/Environment Level of Independence: Needs assistance  Gait / Transfers Assistance Needed: assist with tub transfers; I bed to w/c transfers; non-amb ADL's / Homemaking Assistance Needed: assist with laundry        OT  Diagnosis: Generalized weakness;Acute pain   OT Problem List: Decreased strength;Decreased range of motion;Decreased activity tolerance;Impaired balance (sitting and/or standing);Decreased safety awareness;Decreased knowledge of use of DME or AE;Decreased knowledge of precautions;Pain   OT Treatment/Interventions: Self-care/ADL training;Therapeutic exercise;DME and/or AE instruction;Therapeutic activities;Patient/family education    OT Goals(Current goals can be found in the care plan section) Acute Rehab OT Goals Patient Stated Goal: to return to independent living OT Goal Formulation: With patient/family Time For Goal Achievement: 05/05/15 Potential to Achieve Goals: Good ADL Goals Pt Will Perform Grooming: with set-up;sitting;with supervision (EOB) Pt Will Perform Upper Body Bathing: with set-up;sitting;with supervision (EOB) Pt Will Transfer to Toilet: with +2 assist;with min assist;bedside commode;stand pivot transfer Pt Will Perform Toileting - Clothing Manipulation and hygiene: with min assist;sitting/lateral leans Additional ADL Goal #1: bed mobility for ADL with +2 min A   OT Frequency: Min 2X/week   Barriers to D/C:            Co-evaluation              End of Session Nurse Communication: Mobility status  Activity Tolerance: Patient limited by pain Patient left: in bed;in CPM;with family/visitor present   Time: 3354-5625 OT Time Calculation (min): 13 min Charges:  OT General Charges $OT Visit: 1 Procedure OT Evaluation $Initial OT Evaluation Tier I: 1 Procedure G-Codes:    Mohogany Toppins,HILLARY 05/06/15, 1:09 PM   Luisa Dago, OTR/L  [phone] 05/06/2015

## 2015-04-21 NOTE — Care Management Note (Signed)
Case Management Note  Patient Details  Name: Tracey Mason MRN: [id number] Date of Birth: Jun 28, 1946  Subjective/Objective:                  ORIF left proximal femur  Action/Plan: Discharged to SNF  Expected Discharge Date:  04/23/15               Expected Discharge Plan:  Skilled Nursing Facility  In-House Referral:     Discharge planning Services  CM Consult  Post Acute Care Choice:    Choice offered to:  NA  DME Arranged:  N/A DME Agency:  NA  HH Arranged:  NA HH Agency:     Status of Service:  Completed, signed off  Medicare Important Message Given:    Date Medicare IM Given:    Medicare IM give by:    Date Additional Medicare IM Given:    Additional Medicare Important Message give by:     If discussed at Long Length of Stay Meetings, dates discussed:    Additional Comments:  CM spoke with patient and family at the bedside. Patient lives in an independent living apartment. Family cannot stay with her at discharge. Patient plans to discharge to a SNF.  Antony Haste, RN 04/21/2015, 11:19 AM

## 2015-04-21 NOTE — Progress Notes (Signed)
PROGRESS NOTE  Tracey Mason GUR:[id number] DOB: [birth year] DOA: 04/19/2015 PCP: Minda Meo, MD   HPI: Tracey Mason is a 69 y.o. female with history of diabetes mellitus type 2, hypertension, previous stroke, diastolic dysfunction was brought to the ER on 8/25 after patient had a fall at her house. X-rays revealed a left hip fracture and was admitted on medicine service with ortho consult.   Subjective / 24 H Interval events - underwent surgery yesterday, doing well post op - no chest pain, shortness of breath, no abdominal pain, nausea or vomiting.   Assessment/Plan: Principal Problem:   Hip fracture, left Active Problems:   Hypertension   Closed left hip fracture   Diabetes mellitus type 2, uncontrolled   Chronic anemia    Left hip fracture status post mechanical fall  - orthopedic surgery following, she is s/p LEFT INTRAMEDULLARY (IM) NAIL FEMORAL on 8/26 by Dr. Charlann Boxer - PT today  - prophylaxis per ortho  Diabetes mellitus type 2  - patient is usually on Lantus 50 units at bedtime - received 20 U last night, fasting CBG 191, increase to 30 U. She is eating better  Hypertension  - on lisinopril - stable, BP today 130/50, continue to monitor  ABLA - expected post op, Hb 8.9 today from 10.8, no need for transfusions, continue to monitor  Leukocytosis  - likely reactive, now normalized  UTI - patient with significant dysuria prior to admission and UA borderline - Ceftriaxone, cultures with E coli, narrow antibiotics to Keflex today based on sensitivities   History of CVA  - on aspirin, this was restarted by ortho  Recent left humerus and left patellar fracture  - managed conservatively.  Chronic diastolic dysfunction  - per 2-D echo done in 2014 which showed EF of 60-65% with grade 1 diastolic dysfunction.  - Patient appears compensated at this time.  Prolonged QTC in the EKG of 500 ms.  - avoid QTC prolonging medications.   Diet: Diet clear  liquid Room service appropriate?: Yes; Fluid consistency:: Thin Fluids: None DVT Prophylaxis: SCD  Code Status: DNR Family Communication: no family bedside today Disposition Plan: remain inpatient, PT to see, likely SNF 2-3 days  Consultants:  Orthopedic surgery   Procedures:  None    Antibiotics Ceftriaxone 8/26 >>   Studies  Dg Chest Port 1 View  04/19/2015   CLINICAL DATA:  Preop for left hip fracture.  No chest complaints.  EXAM: PORTABLE CHEST - 1 VIEW  COMPARISON:  12/09/2013  FINDINGS: Shallow inspiration. Normal heart size and pulmonary vascularity. No focal airspace disease or consolidation in the lungs. No blunting of costophrenic angles. No pneumothorax. Mediastinal contours appear intact. Fracture of the left humeral neck was not definitely present on previous studies but is likely old as there appears to be some loss of distinction of the fracture line and callus formation. If patient is symptomatic in the left shoulder, left shoulder series is suggested.  IMPRESSION: No evidence of active pulmonary disease. Left humeral neck fracture, probably old. If the patient is symptomatic in this area suggest left shoulder radiographs.   Electronically Signed   By: Burman Nieves M.D.   On: 04/19/2015 20:52   Dg C-arm 1-60 Min-no Report  04/20/2015   CLINICAL DATA: left hip intramedullary nail   C-ARM 1-60 MINUTES  Fluoroscopy was utilized by the requesting physician.  No radiographic  interpretation.    Dg Hip Operative Unilat With Pelvis Left  04/20/2015   CLINICAL DATA:  Operative imaging during the ORIF of an intertrochanteric left proximal femur fracture.  EXAM: DG C-ARM 1-60 MIN - NRPT [facility]; OPERATIVE LEFT HIP WITH PELVIS  COMPARISON:  04/19/2015  FLUOROSCOPY TIME:  If the device does not provide the exposure index:  Fluoroscopy Time:  0 minutes and 54 seconds  Number of Acquired Images:  3  FINDINGS: Three images show placement of an intra medullary rod supporting a  compression screw, reducing the major fracture components into near anatomic alignment. There is no residual angulation.  Orthopedic hardware is well-seated. There is no acute fracture or evidence of an operative complication.  IMPRESSION: Well aligned major fracture fragments following ORIF of the displaced comminuted intertrochanteric left proximal femur fracture.   Electronically Signed   By: Amie Portland M.D.   On: 04/20/2015 17:51   Dg Hip Unilat With Pelvis 2-3 Views Left  04/19/2015   ADDENDUM REPORT: 04/19/2015 20:41  ADDENDUM: CORRECTED REPORT:  Fracture is of the intertrochanteric left FEMUR.  Impression: Acute fracture inter trochanteric region of the left femur with  varus angulation.   Electronically Signed   By: Burman Nieves M.D.   On: 04/19/2015 20:41   04/19/2015   CLINICAL DATA:  Fall. Hip pain, external rotation comment shortening. Trip and fall injury landing on the left hip.  EXAM: DG HIP (WITH OR WITHOUT PELVIS) 2-3V LEFT  COMPARISON:  01/26/2015  FINDINGS: Acute comminuted posttraumatic fracture of the inter trochanteric region of the left humerus with varus angulation of the fracture fragments and mild displacement of the lesser trochanteric fragment. No dislocation of the hip. Mild degenerative changes demonstrated in the right hip. Pelvis appears intact. SI joints and symphysis pubis are not displaced.  IMPRESSION: Acute fracture inter trochanteric region of the left humerus with varus angulation.  Electronically Signed: By: Burman Nieves M.D. On: 04/19/2015 18:26    Objective  Filed Vitals:   04/21/15 0201 04/21/15 0500 04/21/15 1003 04/21/15 1124  BP: 130/51 137/54 118/46 130/50  Pulse: 98 104 101   Temp: 99 F (37.2 C) 100.3 F (37.9 C) 98.7 F (37.1 C)   TempSrc: Oral Oral Oral   Resp: 20 19 16    Height:      Weight:      SpO2: 98% 97% 98%     Intake/Output Summary (Last 24 hours) at 04/21/15 1128 Last data filed at 04/21/15 1004  Gross per 24 hour    Intake 2048.17 ml  Output   1370 ml  Net 678.17 ml   Filed Weights   04/19/15 1734  Weight: 58.968 kg (130 lb)    Exam:  GENERAL: NAD  HEENT: head NCAT, no scleral icterus. Pupils round and reactive. Mucous membranes are moist.  NECK: Supple.   LUNGS: Clear to auscultation. No wheezing or crackles  HEART: Regular rate and rhythm without murmur. 2+ pulses, no JVD, no peripheral edema  ABDOMEN: Soft, nontender, and nondistended. Positive bowel sounds.   EXTREMITIES: Without any cyanosis, clubbing, rash, lesions or edema.  NEUROLOGIC: Alert and oriented x3. Strength 5/5 in all 4.  Data Reviewed: Basic Metabolic Panel:  Recent Labs Lab 04/19/15 1814 04/20/15 0540 04/21/15 0435  NA 138 138 138  K 3.8 4.8 4.2  CL 101 103 103  CO2 28 24 28   GLUCOSE 107* 188* 191*  BUN 20 22* 18  CREATININE 0.88 0.84 0.77  CALCIUM 9.6 9.2 8.7*   Liver Function Tests:  Recent Labs Lab 04/20/15 0540  AST 46*  ALT 43  ALKPHOS 102  BILITOT 1.1  PROT 7.3  ALBUMIN 3.7   CBC:  Recent Labs Lab 04/19/15 1814 04/20/15 0540 04/21/15 0435  WBC 16.6* 9.8 9.2  NEUTROABS 15.2* 7.2  --   HGB 11.8* 10.8* 8.9*  HCT 35.0* 31.5* 26.6*  MCV 93.3 94.3 95.7  PLT 209 130* 164   CBG:  Recent Labs Lab 04/20/15 1313 04/20/15 1537 04/20/15 1751 04/20/15 2218 04/21/15 0800  GLUCAP 133* 151* 167* 242* 156*    Recent Results (from the past 240 hour(s))  Culture, Urine     Status: None   Collection Time: 04/19/15  8:20 PM  Result Value Ref Range Status   Specimen Description URINE, CLEAN CATCH  Final   Special Requests NONE  Final   Culture   Final    >=100,000 COLONIES/mL ESCHERICHIA COLI Performed at [facility]    Report Status 04/21/2015 FINAL  Final   Organism ID, Bacteria ESCHERICHIA COLI  Final      Susceptibility   Escherichia coli - MIC*    AMPICILLIN <=2 SENSITIVE Sensitive     CEFAZOLIN <=4 SENSITIVE Sensitive     CEFTRIAXONE <=1 SENSITIVE Sensitive      CIPROFLOXACIN <=0.25 SENSITIVE Sensitive     GENTAMICIN <=1 SENSITIVE Sensitive     IMIPENEM <=0.25 SENSITIVE Sensitive     NITROFURANTOIN <=16 SENSITIVE Sensitive     TRIMETH/SULFA <=20 SENSITIVE Sensitive     AMPICILLIN/SULBACTAM <=2 SENSITIVE Sensitive     PIP/TAZO <=4 SENSITIVE Sensitive     * >=100,000 COLONIES/mL ESCHERICHIA COLI  Surgical pcr screen     Status: Abnormal   Collection Time: 04/20/15 10:14 AM  Result Value Ref Range Status   MRSA, PCR NEGATIVE NEGATIVE Final   Staphylococcus aureus POSITIVE (A) NEGATIVE Final    Comment:        The Xpert SA Assay (FDA approved for NASAL specimens in patients over 31 years of age), is one component of a comprehensive surveillance program.  Test performance has been validated by [facility] for patients greater than or equal to 80 year old. It is not intended to diagnose infection nor to guide or monitor treatment.      Scheduled Meds: . aspirin EC  325 mg Oral BID  . cefTRIAXone (ROCEPHIN)  IV  1 g Intravenous Q24H  . docusate sodium  100 mg Oral BID  . DULoxetine  60 mg Oral Daily  . insulin aspart  0-15 Units Subcutaneous TID WC  . insulin glargine  20 Units Subcutaneous QHS  . lisinopril  10 mg Oral Daily  . loratadine  10 mg Oral Daily  . pantoprazole  40 mg Oral Daily  . sertraline  50 mg Oral Daily   Continuous Infusions: . sodium chloride Stopped (04/20/15 1549)  . sodium chloride 50 mL/hr at 04/20/15 2000    Pamella Pert, MD [facility] Pager [phone]. If 7 PM - 7 AM, please contact night-coverage at [email or web address], password [email or web address] 04/21/2015, 11:28 AM  LOS: 2 days

## 2015-04-21 NOTE — Op Note (Signed)
NAMEMarland Kitchen  REEM, FLEURY NO.:  [id number]  MEDICAL RECORD NO.:  [id number]  LOCATION:  1613                         FACILITY:  [facility]  PHYSICIAN:  Madlyn Frankel. Charlann Boxer, M.D.  DATE OF BIRTH:  Mar 20, 1946  DATE OF PROCEDURE:  04/20/2015 DATE OF DISCHARGE:                              OPERATIVE REPORT   PREOPERATIVE DIAGNOSIS:  Comminuted left intertrochanteric femur fracture.  POSTOPERATIVE DIAGNOSIS:  Comminuted left intertrochanteric femur fracture.  PROCEDURE:  Open reduction and internal fixation of left intertrochanteric femur fracture utilizing a Biomet Affixus nail 9 x 180 mm with a 130 degree lag screw.  SURGEON:  Madlyn Frankel. Charlann Boxer, M.D.  ASSISTANT:  Surgical team.  ANESTHESIA:  General.  SPECIMENS:  None.  COMPLICATION:  None.  BLOOD LOSS:  Minimal.  INDICATIONS FOR PROCEDURE:  Ms. Hentges is a 69 year old female, who unfortunately while using a walker stumbled and fell, lost her balance, fell on her left side.  She had immediate onset of pain and was brought to the emergency room.  Radiographs revealed a comminuted intertrochanteric femur fracture.  Per orthopedic protocol, she was admitted to the Hospitalist Service.  She was stabilized and prepared for surgery.  Risks and benefits of surgery were discussed with she and her family.  Consent was obtained for benefit of pain relief and fracture management.  PROCEDURE IN DETAIL:  The patient was brought to operative theater. Once adequate anesthesia, preoperative antibiotics, Ancef administered, in addition to Rocephin which had already been started for urinary tract infection, she was positioned supine on the OSI Hana table.  Right unaffected extremity was flexed and abducted out of the way with bony prominences padded, particularly over the peroneal nerve.  Her left foot was placed in a traction boot.  Once she was positioned appropriately with bony prominences padded. Traction and internal  rotation was applied.  Fluoroscopic imaging was utilized to confirm reduction in AP and lateral planes.  Once this was done, the fluoroscopy was backed out.  The left lower extremity was then prepped and draped in a sterile fashion.  Time-out was performed identifying the patient, planned procedure, and extremity.  Fluoroscopy was brought back to the field, and landmarks were identified.  An incision was made in the proximal tip of the trochanter lateral.  Sharp dissection was carried down through the gluteal fascia. A guidewire was then inserted into the tip of the trochanter into the shaft of the femur.  The proximal femur was then drilled, and the 9 mm x 180 mm nail was then passed by hand to its appropriate depth.  At this point, using the insertion jig, the guidewire was inserted into the center of the femoral head in AP and lateral planes.  I then measured and selected a 105 mm lag screw.  This was then passed after drilling and then compressed, medialized in the shaft of the fracture site.  Following this, a distal interlock was placed.  The jig was removed. Final radiographs were obtained in the AP and lateral planes.  The wounds were irrigated with normal saline solution.  The proximal wound was closed in layers.  The remainder of the wound was closed with 2-0 Vicryl, running 4-0 Monocryl.  The hip was then cleaned, dried, and dressed sterilely using Dermabond and Aquacel dressings.  She was then brought to the recovery room in stable condition tolerating the procedure well.  We will have her be partial weightbearing, 4-6 weeks and until bur and union is identified.     Madlyn Frankel Charlann Boxer, M.D.     MDO/MEDQ  D:  04/20/2015  T:  04/21/2015  Job:  [id number]

## 2015-04-21 NOTE — Evaluation (Signed)
Physical Therapy Evaluation Patient Details Name: Tracey Mason MRN: [id number] DOB: 10/28/1945 Today's Date: 04/21/2015   History of Present Illness  69 yo female adm with hip fx, s/p IM nail L hip; PMHx:  HTN, DM, depression, CVA  Clinical Impression  Pt admitted with above diagnosis. Pt currently with functional limitations due to the deficits listed below (see PT Problem List).  Pt will benefit from skilled PT to increase their independence and safety with mobility to allow discharge to the venue listed below.   Pt is very limited at time of eval, she is rigid in all extremities and resistant to imposed movement, will continue to follow but appears to have limited rehab potential at this point; she is PWB and will need to begin to use RW for stand pivot transfers (pt was at w/c level prior to this fx)     Follow Up Recommendations Supervision/Assistance - 24 hour    Equipment Recommendations  None recommended by PT    Recommendations for Other Services       Precautions / Restrictions Precautions Precautions: Fall Restrictions Weight Bearing Restrictions: Yes LLE Weight Bearing: Partial weight bearing LLE Partial Weight Bearing Percentage or Pounds: 50%      Mobility  Bed Mobility Overal bed mobility: +2 for physical assistance;Needs Assistance Bed Mobility: Supine to Sit;Sit to Supine     Supine to sit: +2 for physical assistance;Total assist Sit to supine: +2 for physical assistance;Total assist   General bed mobility comments: pt equal to 10% or less; assist  with trunk, LEs, bed pad used to scoot laterally and in supine   Transfers                 General transfer comment: NT--pt requiring +2 to sit EOB, pain 10/10  Ambulation/Gait             General Gait Details: non amb prior to adm  Stairs            Wheelchair Mobility    Modified Rankin (Stroke Patients Only)       Balance Overall balance assessment: History of Falls;Needs  assistance Sitting-balance support: Bilateral upper extremity supported;Feet supported Sitting balance-Leahy Scale: Zero Sitting balance - Comments: pt requires +2 assist to safely maintain static sit on EOB; Postural control: Posterior lean                                   Pertinent Vitals/Pain Pain Assessment: Faces Pain Score: 9  Faces Pain Scale: Hurts worst Pain Location: L hip Pain Descriptors / Indicators: Grimacing;Guarding Pain Intervention(s): Limited activity within patient's tolerance;Monitored during session;Repositioned    Home Living Family/patient expects to be discharged to:: Skilled nursing facility Living Arrangements: Alone             Home Equipment: Wheelchair - manual;Bedside commode;Walker - 2 wheels Additional Comments: pt was at [name] x 2 mos; has been at [facility] for only a few days; functioning from w/c level    Prior Function Level of Independence: Needs assistance   Gait / Transfers Assistance Needed: assist with tub transfers; I bed to w/c transfers; non-amb  ADL's / Homemaking Assistance Needed: assist with laundry per dtr        Hand Dominance        Extremity/Trunk Assessment   Upper Extremity Assessment: Defer to OT evaluation;Overall [facility] for tasks assessed (WFL per OT; pt moves UEs very slowly and  with limited AROM during PT eval, unable to self assist  with UEs for bed mobility)           Lower Extremity Assessment: RLE deficits/detail RLE Deficits / Details: pt resistant to imposed movement, PROM/AAROM knee to 30 degrees       Communication      Cognition Arousal/Alertness: Awake/alert Behavior During Therapy: WFL for tasks assessed/performed Overall Cognitive Status: History of cognitive impairments - at baseline       Memory: Decreased short-term memory              General Comments      Exercises        Assessment/Plan    PT Assessment Patient needs continued PT services   PT Diagnosis Acute pain;Generalized weakness   PT Problem List Decreased strength;Decreased range of motion;Decreased activity tolerance;Decreased mobility;Decreased balance  PT Treatment Interventions DME instruction;Functional mobility training;Therapeutic activities;Patient/family education;Therapeutic exercise;Balance training   PT Goals (Current goals can be found in the Care Plan section) Acute Rehab PT Goals Patient Stated Goal: to return to independent living PT Goal Formulation: With patient/family Time For Goal Achievement: 05/05/15 Potential to Achieve Goals: Poor    Frequency Min 3X/week   Barriers to discharge        Co-evaluation               End of Session   Activity Tolerance: Patient limited by pain Patient left: in bed;with call bell/phone within reach;with bed alarm set;with family/visitor present Nurse Communication: Mobility status         Time: 7846-9629 PT Time Calculation (min) (ACUTE ONLY): 33 min   Charges:   PT Evaluation $Initial PT Evaluation Tier I: 1 Procedure PT Treatments $Therapeutic Activity: 8-22 mins   PT G Codes:        Mason,Tracey Langston 2015-04-25, 1:53 PM

## 2015-04-22 DIAGNOSIS — F068 Other specified mental disorders due to known physiological condition: Secondary | ICD-10-CM

## 2015-04-22 LAB — PREPARE RBC (CROSSMATCH)

## 2015-04-22 LAB — BASIC METABOLIC PANEL
ANION GAP: 8 (ref 5–15)
BUN: 16 mg/dL (ref 6–20)
CALCIUM: 8.5 mg/dL — AB (ref 8.9–10.3)
CHLORIDE: 103 mmol/L (ref 101–111)
CO2: 25 mmol/L (ref 22–32)
Creatinine, Ser: 0.83 mg/dL (ref 0.44–1.00)
GFR calc non Af Amer: >60 mL/min (ref >60)
Glucose, Bld: 123 mg/dL — ABNORMAL HIGH (ref 65–99)
POTASSIUM: 4.5 mmol/L (ref 3.5–5.1)
Sodium: 136 mmol/L (ref 135–145)

## 2015-04-22 LAB — CBC
HEMATOCRIT: 23.7 % — AB (ref 36.0–46.0)
HEMOGLOBIN: 7.9 g/dL — AB (ref 12.0–15.0)
MCH: 31.7 pg (ref 26.0–34.0)
MCHC: 33.3 g/dL (ref 30.0–36.0)
MCV: 95.2 fL (ref 78.0–100.0)
Platelets: 153 10*3/uL (ref 150–400)
RBC: 2.49 MIL/uL — AB (ref 3.87–5.11)
RDW: 13.9 % (ref 11.5–15.5)
WBC: 8.3 10*3/uL (ref 4.0–10.5)

## 2015-04-22 LAB — GLUCOSE, CAPILLARY
Glucose-Capillary: 117 mg/dL — ABNORMAL HIGH (ref 65–99)
Glucose-Capillary: 159 mg/dL — ABNORMAL HIGH (ref 65–99)
Glucose-Capillary: 149 mg/dL — ABNORMAL HIGH (ref 65–99)
Glucose-Capillary: 206 mg/dL — ABNORMAL HIGH (ref 65–99)

## 2015-04-22 MED ORDER — HYDROCODONE-ACETAMINOPHEN 5-325 MG PO TABS
1.0000 | ORAL_TABLET | Freq: Three times a day (TID) | ORAL | Status: DC | PRN
  Administered 2015-04-22 – 2015-04-23 (×4): 1 via ORAL
  Filled 2015-04-22 (×4): qty 1

## 2015-04-22 MED ORDER — SODIUM CHLORIDE 0.9 % IV SOLN: Freq: Once | INTRAVENOUS | Status: DC

## 2015-04-22 NOTE — Care Management Important Message (Signed)
Important Message  Patient Details  Name: Tracey Mason MRN: [id number] Date of Birth: September 24, 1945   Medicare Important Message Given:  Yes-second notification given    Antony Haste, RN 04/22/2015, 11:25 AM

## 2015-04-22 NOTE — Clinical Social Work Placement (Signed)
   CLINICAL SOCIAL WORK PLACEMENT  NOTE  Date:  04/22/2015  Patient Details  Name: Tracey Mason MRN: [id number] Date of Birth: 1946/04/23  Clinical Social Work is seeking post-discharge placement for this patient at the Skilled  Nursing Facility level of care (*CSW will initial, date and re-position this form in  chart as items are completed):  Yes   Patient/family provided with [place] Clinical Social Work Department's list of facilities offering this level of care within the geographic area requested by the patient (or if unable, by the patient's family).  Yes   Patient/family informed of their freedom to choose among providers that offer the needed level of care, that participate in Medicare, Medicaid or managed care program needed by the patient, have an available bed and are willing to accept the patient.  Yes   Patient/family informed of [facility] ownership interest in [facility] and [facility], as well as of the fact that they are under no obligation to receive care at these facilities.  PASRR submitted to EDS on       PASRR number received on       Existing PASRR number confirmed on 04/20/15     FL2 transmitted to all facilities in geographic area requested by pt/family on 04/20/15     FL2 transmitted to all facilities within larger geographic area on       Patient informed that his/her managed care company has contracts with or will negotiate with certain facilities, including the following:            Patient/family informed of bed offers received.  Patient chooses bed at     [facility]   Physician recommends and patient chooses bed at      Patient to be transferred to  [facility]  on  .  Patient to be transferred to facility by       Patient family notified on   of transfer.  Name of family member notified:        PHYSICIAN       Additional Comment:    _______________________________________________ Annetta Maw, LCSW 04/22/2015,  12:47 PM

## 2015-04-22 NOTE — Progress Notes (Signed)
PROGRESS NOTE  Tracey Mason WKG:[id number] DOB: [birth year] DOA: 04/19/2015 PCP: Minda Meo, MD   HPI: Tracey Mason is a 69 y.o. female with history of diabetes mellitus type 2, hypertension, previous stroke, diastolic dysfunction was brought to the ER on 8/25 after patient had a fall at her house. X-rays revealed a left hip fracture and was admitted on medicine service with ortho consult.   Subjective / 24 H Interval events - mild confusion this morning - denies pain when still  Assessment/Plan: Principal Problem:   Hip fracture, left Active Problems:   Hypertension   Closed left hip fracture   Diabetes mellitus type 2, uncontrolled   Chronic anemia    Left hip fracture status post mechanical fall  - orthopedic surgery following, she is s/p LEFT INTRAMEDULLARY (IM) NAIL FEMORAL on 8/26 by Dr. Charlann Boxer - ongoing PT - prophylaxis per ortho  Confusion - mild this morning, per Epic she has some memory loss documented in the past, may have mild underlying cognitive impairment, will minimize narcotics  Diabetes mellitus type 2  - patient is usually on Lantus 50 units at bedtime - received 30 U last night, fasting CBG 123, continue. Doesn't eat well yet.  Hypertension  - on lisinopril - stable, BP today 131/51, continue to monitor  ABLA - expected post op, Hb 7.9 today, transfuse 1U pRBC  Leukocytosis  - likely reactive, now normalized  UTI - patient with significant dysuria prior to admission and UA borderline - Ceftriaxone, cultures with E coli, continue Keflex   History of CVA  - on aspirin, this was restarted by ortho  Recent left humerus and left patellar fracture  - managed conservatively.  Chronic diastolic dysfunction  - per 2-D echo done in 2014 which showed EF of 60-65% with grade 1 diastolic dysfunction.  - Patient appears compensated at this time.  Prolonged QTC in the EKG of 500 ms.  - avoid QTC prolonging medications.   Diet: Diet  Carb Modified Fluid consistency:: Thin; Room service appropriate?: Yes Fluids: None DVT Prophylaxis: SCD  Code Status: DNR Family Communication: no family bedside today Disposition Plan: remain inpatient, PT to see, likely SNF 2-3 days  Consultants:  Orthopedic surgery   Procedures:  None    Antibiotics Ceftriaxone 8/26 >>   Studies  Dg C-arm 1-60 Min-no Report  04/20/2015   CLINICAL DATA: left hip intramedullary nail   C-ARM 1-60 MINUTES  Fluoroscopy was utilized by the requesting physician.  No radiographic  interpretation.    Dg Hip Operative Unilat With Pelvis Left  04/20/2015   CLINICAL DATA:  Operative imaging during the ORIF of an intertrochanteric left proximal femur fracture.  EXAM: DG C-ARM 1-60 MIN - NRPT [facility]; OPERATIVE LEFT HIP WITH PELVIS  COMPARISON:  04/19/2015  FLUOROSCOPY TIME:  If the device does not provide the exposure index:  Fluoroscopy Time:  0 minutes and 54 seconds  Number of Acquired Images:  3  FINDINGS: Three images show placement of an intra medullary rod supporting a compression screw, reducing the major fracture components into near anatomic alignment. There is no residual angulation.  Orthopedic hardware is well-seated. There is no acute fracture or evidence of an operative complication.  IMPRESSION: Well aligned major fracture fragments following ORIF of the displaced comminuted intertrochanteric left proximal femur fracture.   Electronically Signed   By: Amie Portland M.D.   On: 04/20/2015 17:51    Objective  Filed Vitals:   04/21/15 1124 04/21/15 1402 04/21/15 2200 04/22/15  0602  BP: 130/50 125/51 118/54 141/54  Pulse:  107 104 111  Temp:  99.2 F (37.3 C) 98.9 F (37.2 C) 100.3 F (37.9 C)  TempSrc:  Oral Oral Oral  Resp:  16 17 17   Height:      Weight:      SpO2:  96% 93% 93%    Intake/Output Summary (Last 24 hours) at 04/22/15 0703 Last data filed at 04/22/15 0603  Gross per 24 hour  Intake   1740 ml  Output    700 ml  Net    1040 ml   Filed Weights   04/19/15 1734  Weight: 58.968 kg (130 lb)    Exam:  GENERAL: NAD  HEENT: head NCAT, no scleral icterus. Pupils round and reactive. Mucous membranes are moist.  NECK: Supple.   LUNGS: Clear to auscultation. No wheezing or crackles  HEART: Regular rate and rhythm without murmur. 2+ pulses, no JVD, no peripheral edema  ABDOMEN: Soft, nontender, and nondistended. Positive bowel sounds.   NEUROLOGIC: Alert and oriented x 1-2. Strength 5/5 in all 4.  Data Reviewed: Basic Metabolic Panel:  Recent Labs Lab 04/19/15 1814 04/20/15 0540 04/21/15 0435 04/22/15 0508  NA 138 138 138 136  K 3.8 4.8 4.2 4.5  CL 101 103 103 103  CO2 28 24 28 25   GLUCOSE 107* 188* 191* 123*  BUN 20 22* 18 16  CREATININE 0.88 0.84 0.77 0.83  CALCIUM 9.6 9.2 8.7* 8.5*   Liver Function Tests:  Recent Labs Lab 04/20/15 0540  AST 46*  ALT 43  ALKPHOS 102  BILITOT 1.1  PROT 7.3  ALBUMIN 3.7   CBC:  Recent Labs Lab 04/19/15 1814 04/20/15 0540 04/21/15 0435 04/22/15 0508  WBC 16.6* 9.8 9.2 8.3  NEUTROABS 15.2* 7.2  --   --   HGB 11.8* 10.8* 8.9* 7.9*  HCT 35.0* 31.5* 26.6* 23.7*  MCV 93.3 94.3 95.7 95.2  PLT 209 130* 164 153   CBG:  Recent Labs Lab 04/21/15 0800 04/21/15 1150 04/21/15 1333 04/21/15 1734 04/21/15 2127  GLUCAP 156* 177* 183* 236* 129*    Recent Results (from the past 240 hour(s))  Culture, Urine     Status: None   Collection Time: 04/19/15  8:20 PM  Result Value Ref Range Status   Specimen Description URINE, CLEAN CATCH  Final   Special Requests NONE  Final   Culture   Final    >=100,000 COLONIES/mL ESCHERICHIA COLI Performed at [facility]    Report Status 04/21/2015 FINAL  Final   Organism ID, Bacteria ESCHERICHIA COLI  Final      Susceptibility   Escherichia coli - MIC*    AMPICILLIN <=2 SENSITIVE Sensitive     CEFAZOLIN <=4 SENSITIVE Sensitive     CEFTRIAXONE <=1 SENSITIVE Sensitive     CIPROFLOXACIN <=0.25  SENSITIVE Sensitive     GENTAMICIN <=1 SENSITIVE Sensitive     IMIPENEM <=0.25 SENSITIVE Sensitive     NITROFURANTOIN <=16 SENSITIVE Sensitive     TRIMETH/SULFA <=20 SENSITIVE Sensitive     AMPICILLIN/SULBACTAM <=2 SENSITIVE Sensitive     PIP/TAZO <=4 SENSITIVE Sensitive     * >=100,000 COLONIES/mL ESCHERICHIA COLI  Surgical pcr screen     Status: Abnormal   Collection Time: 04/20/15 10:14 AM  Result Value Ref Range Status   MRSA, PCR NEGATIVE NEGATIVE Final   Staphylococcus aureus POSITIVE (A) NEGATIVE Final    Comment:        The Xpert SA Assay (FDA  approved for NASAL specimens in patients over 70 years of age), is one component of a comprehensive surveillance program.  Test performance has been validated by [facility] for patients greater than or equal to 69 year old. It is not intended to diagnose infection nor to guide or monitor treatment.      Scheduled Meds: . sodium chloride   Intravenous Once  . aspirin EC  325 mg Oral BID  . cephALEXin  500 mg Oral 3 times per day  . docusate sodium  100 mg Oral BID  . DULoxetine  60 mg Oral Daily  . insulin aspart  0-15 Units Subcutaneous TID WC  . insulin glargine  30 Units Subcutaneous QHS  . lisinopril  10 mg Oral Daily  . loratadine  10 mg Oral Daily  . pantoprazole  40 mg Oral Daily  . sertraline  50 mg Oral Daily   Continuous Infusions: . sodium chloride Stopped (04/20/15 1549)  . sodium chloride 50 mL/hr at 04/21/15 1958    Pamella Pert, MD [facility] Pager [phone]. If 7 PM - 7 AM, please contact night-coverage at [email or web address], password [facility] 04/22/2015, 7:03 AM  LOS: 3 days

## 2015-04-22 NOTE — Clinical Social Work Note (Signed)
CSW met with pt and her daughter to discuss SNF bed options.  CSW presented pt and her daughter with facilities that have stated they can accommodate pt for her rehab needs  Pt and her daughter stated that they would like [facility] for rehab as pt was there before and it will be familiar to her.  CSW sent recent clinicals and note to [facility] letting them know that pt has chosen them for her rehab needs  .Dede Query, LCSW [facility] Clinical Social Worker - Weekend Coverage cell #: [phone]

## 2015-04-22 NOTE — Progress Notes (Signed)
Subjective: 2 Days Post-Op Procedure(s) (LRB): LEFT HIP INTRAMEDULLARY NAIL FEMORAL  (Left)  Patient reports pain as mild to moderate.  Patient is a little confused this morning, but states that she is hungry and wants to eat a little.  Nurse states that she didn't eat much yesterday.  No BM, but admits to flatulence.    Objective:   VITALS:  Temp:  [98.7 F (37.1 C)-100.3 F (37.9 C)] 100.3 F (37.9 C) (08/28 0602) Pulse Rate:  [101-111] 111 (08/28 0602) Resp:  [16-17] 17 (08/28 0602) BP: (118-141)/(46-54) 141/54 mmHg (08/28 0602) SpO2:  [93 %-98 %] 93 % (08/28 0602)  General: WDWN patient in NAD. Psych:  Appropriate mood and affect. Neuro:  A&O x 3, Moving all extremities, sensation intact to light touch HEENT:  EOMs intact Chest:  Even non-labored respirations Skin:  Dressing C/D/I Extremities: warm/dry, mild diffuse edema, no erythmea.  No lymphadenopathy. Pulses: Popliteus 2+ MSK:  ROM: Full ankle DF/PF, MMT:  Patient can perform quad set, (-) [facility]    LABS  Recent Labs  04/19/15 1814 04/20/15 0540 04/21/15 0435 04/22/15 0508  HGB 11.8* 10.8* 8.9* 7.9*  WBC 16.6* 9.8 9.2 8.3  PLT 209 130* 164 153    Recent Labs  04/21/15 0435 04/22/15 0508  NA 138 136  K 4.2 4.5  CL 103 103  CO2 28 25  BUN 18 16  CREATININE 0.77 0.83  GLUCOSE 191* 123*    Recent Labs  04/19/15 1814  INR 1.09     Assessment/Plan: 2 Days Post-Op Procedure(s) (LRB): LEFT HIP INTRAMEDULLARY NAIL FEMORAL  (Left)  Up with therapy  D/C planning - suspect SNF Hgb at 7.9.  Continued monitoring for drop in Hgb or change in symptoms. Continued pain management via medicine  Alfredo Martinez, PA-C, ATC [facility] Orthopaedics Office:  [phone]

## 2015-04-23 ENCOUNTER — Encounter (HOSPITAL_COMMUNITY): Payer: Self-pay | Admitting: Orthopedic Surgery

## 2015-04-23 DIAGNOSIS — S72002D Fracture of unspecified part of neck of left femur, subsequent encounter for closed fracture with routine healing: Secondary | ICD-10-CM

## 2015-04-23 LAB — TYPE AND SCREEN
ABO/RH(D): O POS
Antibody Screen: NEGATIVE
UNIT DIVISION: 0
Unit division: 0

## 2015-04-23 LAB — CBC
HEMATOCRIT: 29.2 % — AB (ref 36.0–46.0)
HEMOGLOBIN: 9.8 g/dL — AB (ref 12.0–15.0)
MCH: 31.1 pg (ref 26.0–34.0)
MCHC: 33.6 g/dL (ref 30.0–36.0)
MCV: 92.7 fL (ref 78.0–100.0)
Platelets: 170 10*3/uL (ref 150–400)
RBC: 3.15 MIL/uL — AB (ref 3.87–5.11)
RDW: 13.7 % (ref 11.5–15.5)
WBC: 8.7 10*3/uL (ref 4.0–10.5)

## 2015-04-23 LAB — GLUCOSE, CAPILLARY
Glucose-Capillary: 216 mg/dL — ABNORMAL HIGH (ref 65–99)
Glucose-Capillary: 177 mg/dL — ABNORMAL HIGH (ref 65–99)

## 2015-04-23 MED ORDER — ASPIRIN 325 MG PO TBEC: 325.0000 mg | DELAYED_RELEASE_TABLET | Freq: Two times a day (BID) | ORAL | Status: AC

## 2015-04-23 MED ORDER — BISACODYL 10 MG RE SUPP
10.0000 mg | Freq: Once | RECTAL | Status: AC
  Administered 2015-04-23: 10 mg via RECTAL
  Filled 2015-04-23: qty 1

## 2015-04-23 MED ORDER — HYDROCODONE-ACETAMINOPHEN 5-325 MG PO TABS
1.0000 | ORAL_TABLET | Freq: Four times a day (QID) | ORAL | Status: DC | PRN
Start: 2015-04-23 — End: 2016-06-10

## 2015-04-23 MED ORDER — CEPHALEXIN 500 MG PO CAPS: 500.0000 mg | ORAL_CAPSULE | Freq: Three times a day (TID) | ORAL | Status: DC

## 2015-04-23 NOTE — Clinical Social Work Placement (Signed)
   CLINICAL SOCIAL WORK PLACEMENT  NOTE  Date:  04/23/2015  Patient Details  Name: Tracey Mason MRN: [id number] Date of Birth: [birth year]  Clinical Social Work is seeking post-discharge placement for this patient at the Skilled  Nursing Facility level of care (*CSW will initial, date and re-position this form in  chart as items are completed):  Yes   Patient/family provided with [place] Clinical Social Work Department's list of facilities offering this level of care within the geographic area requested by the patient (or if unable, by the patient's family).  Yes   Patient/family informed of their freedom to choose among providers that offer the needed level of care, that participate in Medicare, Medicaid or managed care program needed by the patient, have an available bed and are willing to accept the patient.  Yes   Patient/family informed of [facility]'s ownership interest in [facility] and [facility], as well as of the fact that they are under no obligation to receive care at these facilities.  PASRR submitted to EDS on       PASRR number received on       Existing PASRR number confirmed on 04/20/15     FL2 transmitted to all facilities in geographic area requested by pt/family on 04/20/15     FL2 transmitted to all facilities within larger geographic area on       Patient informed that his/her managed care company has contracts with or will negotiate with certain facilities, including the following:        Yes   Patient/family informed of bed offers received.  Patient chooses bed at [facility]     Physician recommends and patient chooses bed at      Patient to be transferred to [facility] on 04/23/15.  Patient to be transferred to facility by PTAR     Patient family notified on 04/23/15 of transfer.  Name of family member notified:  DAUGHTER     PHYSICIAN       Additional Comment: Pt /daughter are in agreement with  d/c to [facility]  today. [facility] transport required. Pt / daughter are aware out of pocket costs may be associated with [facility] transport. [employer] medicare provided prior authorization for SNF placement. NSG reviewed d/c summary, scripts, avs. Scripts included in d/c packet. D/c Summary sent to SNF for review prior to d/c.   _______________________________________________ Royetta Asal, LCSW  [phone] 04/23/2015, 3:15 PM

## 2015-04-23 NOTE — Progress Notes (Signed)
Occupational Therapy Treatment Patient Details Name: SAHMYA ARAI MRN: [id number] DOB: 1946/06/07 Today's Date: 04/23/2015    History of present illness 69 yo female adm with hip fx, s/p IM nail L hip; PMHx:  HTN, DM, depression, CVA   OT comments  Pt sat EOB but screamed in pain and said she couldn't do anymore thisday  Follow Up Recommendations  SNF;Supervision/Assistance - 24 hour              Mobility Bed Mobility Overal bed mobility: Needs Assistance Bed Mobility: Supine to Sit     Supine to sit: Max assist Sit to supine: Max assist      Transfers                      Balance                                   ADL Overall ADL's : Needs assistance/impaired                                       General ADL Comments: Pt sat EOB and performed incentive spirometer but needed to return to supine due to pain      Vision                     Perception     Praxis      Cognition   Behavior During Therapy: [facility] for tasks assessed/performed Overall Cognitive Status: Within Functional Limits for tasks assessed                       Extremity/Trunk Assessment               Exercises     Shoulder Instructions       General Comments      Pertinent Vitals/ Pain       Pain Assessment: 0-10 Pain Score: 8  Pain Descriptors / Indicators: Sore;Guarding;Grimacing;Discomfort Pain Intervention(s): Limited activity within patient's tolerance;Monitored during session;Repositioned;Patient requesting pain meds-RN notified  Home Living                                          Prior Functioning/Environment              Frequency       Progress Toward Goals  OT Goals(current goals can now be found in the care plan section)        Plan Discharge plan remains appropriate    Co-evaluation                 End of Session     Activity Tolerance Patient limited by  pain   Patient Left in bed;with call bell/phone within reach;with bed alarm set;with family/visitor present   Nurse Communication Mobility status        Time: 1101-1118 OT Time Calculation (min): 17 min  Charges: OT General Charges $OT Visit: 1 Procedure OT Treatments $Self Care/Home Management : 8-22 mins  Luca Dyar, [place] 04/23/2015, 12:38 PM

## 2015-04-23 NOTE — Progress Notes (Signed)
FL2 and DNR in chart for MD signature. Pt has SNF bed at [facility] today if stable for d/c. CSW will follow to assist with d/c planning.  Cori Razor LCSW [phone]

## 2015-04-23 NOTE — Discharge Summary (Signed)
Physician Discharge Summary  Tracey Mason:[id number] DOB: Jun 15, 1946 DOA: 04/19/2015  PCP: Minda Meo, MD  Admit date: 04/19/2015 Discharge date: 04/23/2015  Time spent: > 35 minutes  Recommendations for Outpatient Follow-up:  1. Follow up with Dr. Charlann Boxer in 2 weeks 2. Follow up with Dr. Jacky Kindle in 1-2 months 3. Continue Keflex for UTI for 4 additional days   Discharge Diagnoses:  Principal Problem:   Hip fracture, left Active Problems:   Hypertension   Closed left hip fracture   Diabetes mellitus type 2, uncontrolled   Chronic anemia  Discharge Condition: stable  Diet recommendation: diabetic  Filed Weights   04/19/15 1734  Weight: 58.968 kg (130 lb)   History of present illness:  Tracey Mason is a 69 y.o. female with history of diabetes mellitus type 2, hypertension, previous stroke, diastolic dysfunction was brought to the ER after patient had a fall [facility]. X-rays reveal a left hip fracture. Patient was recently discharged from rehabilitation last week after being conservatively managed for Mason left patellar and left humerus fracture sustained in June of this year. Orthopedic surgeon Dr.Ollin was consulted by ER physician and plans to have surgery in a.m. On my exam patient denies any chest pain or shortness of breath headache visual symptoms nausea vomiting abdominal pain or diarrhea. Patient states she was walking with help of a walker when she suddenly lost balance and fell. Denies having hit Mason head or loss consciousness or did not have any dizziness or palpitations.   Hospital Course:  Left hip fracture status post mechanical fall - orthopedic surgery was consulted and patient underwent LEFT INTRAMEDULLARY (IM) NAIL FEMORAL on 8/26 by Dr. Charlann Boxer. She did well post op, worked with PT and she will be discharged to SNF. DVT prophylaxis as an outpatient per orthopedic surgery.  Intermittent Confusion - per daughter this is chronic, has been going on for  about a year, stable.  Diabetes mellitus type 2 - resume [facility] home regimen  Hypertension - resume [facility] home regimen  ABLA - expected post op, Mason hemoglobin was as low as 7.9 today, improved to 9/8 after only 1 U pRBC. Repeat CBC in a week after discharge Leukocytosis - likely reactive, now normalized UTI - patient with significant dysuria prior to admission and UA borderline, she was initially started on Ceftriaxone, cultures speciated E coli, and Mason antibiotics were narrowed to Keflex per sensitivities. Continue 4 additional days after d/c  History of CVA - on aspirin, this was restarted by ortho Recent left humerus and left patellar fracture - managed conservatively. Chronic diastolic dysfunction - per 2-D echo done in 2014 which showed EF of 60-65% with grade 1 diastolic dysfunction. Patient appears compensated at this time. Prolonged QTC in the EKG of 500 ms - avoid QTC prolonging medications.  Procedures:  IM nail fixation 8/26   Consultations:  Orthopedic surgery   Discharge Exam: Filed Vitals:   04/22/15 1400 04/22/15 1505 04/22/15 2122 04/23/15 0622  BP: 115/68 129/57 146/64 146/64  Pulse: 105 91 103 89  Temp:  98.7 F (37.1 C) 99.9 F (37.7 C) 98.5 F (36.9 C)  TempSrc: Oral Oral Axillary Oral  Resp: 18 18 16 16   Height:      Weight:      SpO2: 96% 94% 93% 94%   General: NAD Cardiovascular: RRR Respiratory: CTA biL  Discharge Instructions     Medication List    STOP taking these medications  aspirin 81 MG tablet  Replaced by:  aspirin 325 MG EC tablet      TAKE these medications        aspirin 325 MG EC tablet  Take 1 tablet (325 mg total) by mouth 2 (two) times daily.     cephALEXin 500 MG capsule  Commonly known as:  KEFLEX  Take 1 capsule (500 mg total) by mouth every 8 (eight) hours. For 4 additional days     cetirizine 10 MG tablet  Commonly known as:  ZYRTEC  Take 10 mg by mouth daily.     CITRACAL + D PO  Take 1 tablet by mouth 2  (two) times daily.     DULoxetine 60 MG capsule  Commonly known as:  CYMBALTA  Take 60 mg by mouth daily.     HYDROcodone-acetaminophen 5-325 MG per tablet  Commonly known as:  NORCO/VICODIN  Take 1 tablet by mouth every 6 (six) hours as needed.     insulin glargine 100 UNIT/ML injection  Commonly known as:  LANTUS  Inject 50 Units into the skin at bedtime.     insulin lispro 100 UNIT/ML injection  Commonly known as:  HUMALOG  Inject 30 Units into the skin 3 (three) times daily before meals.     lisinopril 10 MG tablet  Commonly known as:  PRINIVIL,ZESTRIL  Take 1 tablet (10 mg total) by mouth daily.     meloxicam 15 MG tablet  Commonly known as:  MOBIC  Take 15 mg by mouth daily.     pantoprazole 40 MG tablet  Commonly known as:  PROTONIX  Take 40 mg by mouth daily.     sertraline 50 MG tablet  Commonly known as:  ZOLOFT  Take 50 mg by mouth daily.     vitamin C 1000 MG tablet  Take 1,000 mg by mouth daily.         The results of significant diagnostics from this hospitalization (including imaging, microbiology, ancillary and laboratory) are listed below for reference.    Significant Diagnostic Studies: Dg Chest Port 1 View  04/19/2015   CLINICAL DATA:  Preop for left hip fracture.  No chest complaints.  EXAM: PORTABLE CHEST - 1 VIEW  COMPARISON:  12/09/2013  FINDINGS: Shallow inspiration. Normal heart size and pulmonary vascularity. No focal airspace disease or consolidation in the lungs. No blunting of costophrenic angles. No pneumothorax. Mediastinal contours appear intact. Fracture of the left humeral neck was not definitely present on previous studies but is likely old as there appears to be some loss of distinction of the fracture line and callus formation. If patient is symptomatic in the left shoulder, left shoulder series is suggested.  IMPRESSION: No evidence of active pulmonary disease. Left humeral neck fracture, probably old. If the patient is symptomatic in  this area suggest left shoulder radiographs.   Electronically Signed   By: Burman Nieves M.D.   On: 04/19/2015 20:52   Dg C-arm 1-60 Min-no Report  04/20/2015   CLINICAL DATA: left hip intramedullary nail   C-ARM 1-60 MINUTES  Fluoroscopy was utilized by the requesting physician.  No radiographic  interpretation.    Dg Hip Operative Unilat With Pelvis Left  04/20/2015   CLINICAL DATA:  Operative imaging during the ORIF of an intertrochanteric left proximal femur fracture.  EXAM: DG C-ARM 1-60 MIN - NRPT [facility]; OPERATIVE LEFT HIP WITH PELVIS  COMPARISON:  04/19/2015  FLUOROSCOPY TIME:  If the device does not provide the exposure index:  Fluoroscopy Time:  0 minutes and 54 seconds  Number of Acquired Images:  3  FINDINGS: Three images show placement of an intra medullary rod supporting a compression screw, reducing the major fracture components into near anatomic alignment. There is no residual angulation.  Orthopedic hardware is well-seated. There is no acute fracture or evidence of an operative complication.  IMPRESSION: Well aligned major fracture fragments following ORIF of the displaced comminuted intertrochanteric left proximal femur fracture.   Electronically Signed   By: Amie Portland M.D.   On: 04/20/2015 17:51   Dg Hip Unilat With Pelvis 2-3 Views Left  04/19/2015   ADDENDUM REPORT: 04/19/2015 20:41  ADDENDUM: CORRECTED REPORT:  Fracture is of the intertrochanteric left FEMUR.  Impression: Acute fracture inter trochanteric region of the left femur with  varus angulation.   Electronically Signed   By: Burman Nieves M.D.   On: 04/19/2015 20:41   04/19/2015   CLINICAL DATA:  Fall. Hip pain, external rotation comment shortening. Trip and fall injury landing on the left hip.  EXAM: DG HIP (WITH OR WITHOUT PELVIS) 2-3V LEFT  COMPARISON:  01/26/2015  FINDINGS: Acute comminuted posttraumatic fracture of the inter trochanteric region of the left humerus with varus angulation of the fracture fragments  and mild displacement of the lesser trochanteric fragment. No dislocation of the hip. Mild degenerative changes demonstrated in the right hip. Pelvis appears intact. SI joints and symphysis pubis are not displaced.  IMPRESSION: Acute fracture inter trochanteric region of the left humerus with varus angulation.  Electronically Signed: By: Burman Nieves M.D. On: 04/19/2015 18:26    Microbiology: Recent Results (from the past 240 hour(s))  Culture, Urine     Status: None   Collection Time: 04/19/15  8:20 PM  Result Value Ref Range Status   Specimen Description URINE, CLEAN CATCH  Final   Special Requests NONE  Final   Culture   Final    >=100,000 COLONIES/mL ESCHERICHIA COLI Performed at [facility]    Report Status 04/21/2015 FINAL  Final   Organism ID, Bacteria ESCHERICHIA COLI  Final      Susceptibility   Escherichia coli - MIC*    AMPICILLIN <=2 SENSITIVE Sensitive     CEFAZOLIN <=4 SENSITIVE Sensitive     CEFTRIAXONE <=1 SENSITIVE Sensitive     CIPROFLOXACIN <=0.25 SENSITIVE Sensitive     GENTAMICIN <=1 SENSITIVE Sensitive     IMIPENEM <=0.25 SENSITIVE Sensitive     NITROFURANTOIN <=16 SENSITIVE Sensitive     TRIMETH/SULFA <=20 SENSITIVE Sensitive     AMPICILLIN/SULBACTAM <=2 SENSITIVE Sensitive     PIP/TAZO <=4 SENSITIVE Sensitive     * >=100,000 COLONIES/mL ESCHERICHIA COLI  Surgical pcr screen     Status: Abnormal   Collection Time: 04/20/15 10:14 AM  Result Value Ref Range Status   MRSA, PCR NEGATIVE NEGATIVE Final   Staphylococcus aureus POSITIVE (A) NEGATIVE Final    Comment:        The Xpert SA Assay (FDA approved for NASAL specimens in patients over 65 years of age), is one component of a comprehensive surveillance program.  Test performance has been validated by [facility] for patients greater than or equal to 44 year old. It is not intended to diagnose infection nor to guide or monitor treatment.      Labs: Basic Metabolic Panel:  Recent  Labs Lab 04/19/15 1814 04/20/15 0540 04/21/15 0435 04/22/15 0508  NA 138 138 138 136  K 3.8 4.8 4.2 4.5  CL  101 103 103 103  CO2 28 24 28 25   GLUCOSE 107* 188* 191* 123*  BUN 20 22* 18 16  CREATININE 0.88 0.84 0.77 0.83  CALCIUM 9.6 9.2 8.7* 8.5*   Liver Function Tests:  Recent Labs Lab 04/20/15 0540  AST 46*  ALT 43  ALKPHOS 102  BILITOT 1.1  PROT 7.3  ALBUMIN 3.7   CBC:  Recent Labs Lab 04/19/15 1814 04/20/15 0540 04/21/15 0435 04/22/15 0508 04/23/15 0458  WBC 16.6* 9.8 9.2 8.3 8.7  NEUTROABS 15.2* 7.2  --   --   --   HGB 11.8* 10.8* 8.9* 7.9* 9.8*  HCT 35.0* 31.5* 26.6* 23.7* 29.2*  MCV 93.3 94.3 95.7 95.2 92.7  PLT 209 130* 164 153 170   CBG:  Recent Labs Lab 04/22/15 0748 04/22/15 1236 04/22/15 1731 04/22/15 2211 04/23/15 0715  GLUCAP 117* 159* 149* 206* 216*      Signed:  Kees Idrovo  [facility] 04/23/2015, 10:55 AM

## 2016-06-09 ENCOUNTER — Encounter (HOSPITAL_COMMUNITY): Payer: Self-pay | Admitting: *Deleted

## 2016-06-09 ENCOUNTER — Other Ambulatory Visit: Payer: Self-pay | Admitting: Urology

## 2016-06-10 ENCOUNTER — Encounter (HOSPITAL_COMMUNITY): Payer: Self-pay | Admitting: *Deleted

## 2016-06-18 ENCOUNTER — Encounter (HOSPITAL_COMMUNITY): Payer: Self-pay | Admitting: *Deleted

## 2016-06-19 NOTE — Progress Notes (Signed)
Called [name] and spoke with nurse, Lupita Leash who confirmed that patient takes Cymbalta, claritin and Protonix in am.  Nurse at [name] also confirmed that patient takes Novolog Insulin 3 times daily with meals, patient is a type II diabetic, and patient takes Levemir 33 units at breakfast and supper daily .  Paged diabetic coordinator to verify preop instructions to be faxed to [name] to state: Novolog Insulin - Take usual doses day before surgery.  Take 1/2 of correction dose am of surgery if glucose > 220.  Levemir Insulin: day before surgery take 100% of am dose and 50 % of pm dose which is 16 units.  Levemir Insulin : take 16 units of am dose morning of surgery.   Faxed to [facility] and received confirmation of preop instructions for surgery on 06/30/2016.

## 2016-06-19 NOTE — Progress Notes (Signed)
Spoke with Dr Desmond Lope ( anesthesia ) and he stated that Same Day Surgery patients should follow the same instructions and guidelines as the patients who come in for preop appointments regarding Diabetic Preop instructions and Protocol.

## 2016-06-20 NOTE — Progress Notes (Signed)
Called [name] and Tracey Mason took a message to give to nurse that I needed to know they have received preop instructions.  With my call back number of [phone] Monday- Friday 9-5.

## 2016-06-20 NOTE — Progress Notes (Signed)
Called daughter, Amparo Bristol and left voice mail message regarding date and time of surgery along with arrival time and left my call back number to verify she has received message of [phone].

## 2016-06-23 NOTE — Progress Notes (Signed)
Called [name] and spoke to Gertie Gowda, Licensed conveyancer who states that the nurse yesterday, Vilma Meckel, LPN had received the pre-op surgical instructions for surgery on 06/30/16 and they understood the instructions. I informed her that if the nurse today taking care of the patient had any questions about the instructions ,to call me back at [phone] til 5 pm. Gertie Gowda verbalized understanding.

## 2016-06-23 NOTE — Progress Notes (Signed)
Tracey Mason, daughter of patient called back and informed her of that her mother needs to report to Short Stay at [facility] on 06/30/2016 at 0930 am. Daughter verbalized understanding.

## 2016-06-30 ENCOUNTER — Encounter (HOSPITAL_COMMUNITY): Payer: Self-pay | Admitting: Certified Registered Nurse Anesthetist

## 2016-06-30 ENCOUNTER — Ambulatory Visit (HOSPITAL_COMMUNITY): Payer: Commercial Managed Care - HMO | Admitting: Certified Registered Nurse Anesthetist

## 2016-06-30 ENCOUNTER — Ambulatory Visit (HOSPITAL_COMMUNITY)
Admission: RE | Admit: 2016-06-30 | Discharge: 2016-06-30 | Disposition: A | Discharge status: Home / Self Care | Payer: Commercial Managed Care - HMO | Source: Ambulatory Visit | Attending: Urology | Admitting: Urology

## 2016-06-30 ENCOUNTER — Encounter (HOSPITAL_COMMUNITY)
Admission: RE | Disposition: A | Discharge status: Home / Self Care | Payer: Self-pay | Source: Ambulatory Visit | Attending: Urology

## 2016-06-30 DIAGNOSIS — E119 Type 2 diabetes mellitus without complications: Secondary | ICD-10-CM | POA: Diagnosis not present

## 2016-06-30 DIAGNOSIS — Z800 Family history of malignant neoplasm of digestive organs: Secondary | ICD-10-CM | POA: Insufficient documentation

## 2016-06-30 DIAGNOSIS — Z8673 Personal history of transient ischemic attack (TIA), and cerebral infarction without residual deficits: Secondary | ICD-10-CM | POA: Insufficient documentation

## 2016-06-30 DIAGNOSIS — Z87891 Personal history of nicotine dependence: Secondary | ICD-10-CM | POA: Insufficient documentation

## 2016-06-30 DIAGNOSIS — I252 Old myocardial infarction: Secondary | ICD-10-CM | POA: Insufficient documentation

## 2016-06-30 DIAGNOSIS — Z833 Family history of diabetes mellitus: Secondary | ICD-10-CM | POA: Insufficient documentation

## 2016-06-30 DIAGNOSIS — Z823 Family history of stroke: Secondary | ICD-10-CM | POA: Diagnosis not present

## 2016-06-30 DIAGNOSIS — N3030 Trigonitis without hematuria: Secondary | ICD-10-CM | POA: Diagnosis not present

## 2016-06-30 DIAGNOSIS — Z8249 Family history of ischemic heart disease and other diseases of the circulatory system: Secondary | ICD-10-CM | POA: Diagnosis not present

## 2016-06-30 DIAGNOSIS — E7800 Pure hypercholesterolemia, unspecified: Secondary | ICD-10-CM | POA: Insufficient documentation

## 2016-06-30 DIAGNOSIS — F329 Major depressive disorder, single episode, unspecified: Secondary | ICD-10-CM | POA: Diagnosis not present

## 2016-06-30 DIAGNOSIS — K219 Gastro-esophageal reflux disease without esophagitis: Secondary | ICD-10-CM | POA: Insufficient documentation

## 2016-06-30 DIAGNOSIS — Z888 Allergy status to other drugs, medicaments and biological substances status: Secondary | ICD-10-CM | POA: Insufficient documentation

## 2016-06-30 DIAGNOSIS — I110 Hypertensive heart disease with heart failure: Secondary | ICD-10-CM | POA: Insufficient documentation

## 2016-06-30 DIAGNOSIS — N329 Bladder disorder, unspecified: Secondary | ICD-10-CM | POA: Diagnosis present

## 2016-06-30 DIAGNOSIS — I5032 Chronic diastolic (congestive) heart failure: Secondary | ICD-10-CM | POA: Diagnosis not present

## 2016-06-30 DIAGNOSIS — Z794 Long term (current) use of insulin: Secondary | ICD-10-CM | POA: Diagnosis not present

## 2016-06-30 DIAGNOSIS — M069 Rheumatoid arthritis, unspecified: Secondary | ICD-10-CM | POA: Insufficient documentation

## 2016-06-30 DIAGNOSIS — Z79899 Other long term (current) drug therapy: Secondary | ICD-10-CM | POA: Diagnosis not present

## 2016-06-30 DIAGNOSIS — Z7982 Long term (current) use of aspirin: Secondary | ICD-10-CM | POA: Insufficient documentation

## 2016-06-30 HISTORY — DX: Low back pain: M54.5

## 2016-06-30 HISTORY — DX: Cognitive communication deficit: R41.841

## 2016-06-30 HISTORY — DX: History of falling: Z91.81

## 2016-06-30 HISTORY — DX: Unspecified lack of coordination: R27.9

## 2016-06-30 HISTORY — PX: CYSTOSCOPY W/ RETROGRADES: SHX1426

## 2016-06-30 HISTORY — DX: Urinary tract infection, site not specified: N39.0

## 2016-06-30 HISTORY — PX: CYSTOSCOPY WITH BIOPSY: SHX5122

## 2016-06-30 HISTORY — PX: CYSTOSCOPY WITH FULGERATION: SHX6638

## 2016-06-30 HISTORY — DX: Low back pain, unspecified: M54.50

## 2016-06-30 HISTORY — DX: Heart failure, unspecified: I50.9

## 2016-06-30 HISTORY — DX: Age-related osteoporosis without current pathological fracture: M81.0

## 2016-06-30 LAB — CBC
HCT: 37.4 % (ref 36.0–46.0)
Hemoglobin: 12.8 g/dL (ref 12.0–15.0)
MCH: 33 pg (ref 26.0–34.0)
MCHC: 34.2 g/dL (ref 30.0–36.0)
MCV: 96.4 fL (ref 78.0–100.0)
PLATELETS: 298 10*3/uL (ref 150–400)
RBC: 3.88 MIL/uL (ref 3.87–5.11)
RDW: 12.6 % (ref 11.5–15.5)
WBC: 9.4 10*3/uL (ref 4.0–10.5)

## 2016-06-30 LAB — GLUCOSE, CAPILLARY
GLUCOSE-CAPILLARY: 161 mg/dL — AB (ref 65–99)
GLUCOSE-CAPILLARY: 163 mg/dL — AB (ref 65–99)
Glucose-Capillary: 176 mg/dL — ABNORMAL HIGH (ref 65–99)

## 2016-06-30 LAB — BASIC METABOLIC PANEL
Anion gap: 8 (ref 5–15)
BUN: 19 mg/dL (ref 6–20)
CALCIUM: 9.2 mg/dL (ref 8.9–10.3)
CO2: 26 mmol/L (ref 22–32)
CREATININE: 1.34 mg/dL — AB (ref 0.44–1.00)
Chloride: 101 mmol/L (ref 101–111)
GFR calc Af Amer: 45 mL/min — ABNORMAL LOW (ref >60)
GFR, EST NON AFRICAN AMERICAN: 39 mL/min — AB (ref >60)
Glucose, Bld: 170 mg/dL — ABNORMAL HIGH (ref 65–99)
Potassium: 4.3 mmol/L (ref 3.5–5.1)
SODIUM: 135 mmol/L (ref 135–145)

## 2016-06-30 SURGERY — CYSTOSCOPY, WITH BIOPSY
Anesthesia: General

## 2016-06-30 MED ORDER — CEFAZOLIN SODIUM-DEXTROSE 2-4 GM/100ML-% IV SOLN
2.0000 g | INTRAVENOUS | Status: AC
  Administered 2016-06-30: 2 g via INTRAVENOUS
  Filled 2016-06-30: qty 100

## 2016-06-30 MED ORDER — MIDAZOLAM HCL 2 MG/2ML IJ SOLN
INTRAMUSCULAR | Status: AC
  Filled 2016-06-30: qty 2

## 2016-06-30 MED ORDER — LACTATED RINGERS IV SOLN: INTRAVENOUS | Status: DC

## 2016-06-30 MED ORDER — FENTANYL CITRATE (PF) 100 MCG/2ML IJ SOLN
INTRAMUSCULAR | Status: DC | PRN
  Administered 2016-06-30 (×2): 50 ug via INTRAVENOUS

## 2016-06-30 MED ORDER — STERILE WATER FOR IRRIGATION IR SOLN
Status: DC | PRN
  Administered 2016-06-30: 1000 mL

## 2016-06-30 MED ORDER — MEPERIDINE HCL 50 MG/ML IJ SOLN: 6.2500 mg | INTRAMUSCULAR | Status: DC | PRN

## 2016-06-30 MED ORDER — IOHEXOL 300 MG/ML  SOLN
INTRAMUSCULAR | Status: DC | PRN
  Administered 2016-06-30: 12 mL

## 2016-06-30 MED ORDER — PROPOFOL 10 MG/ML IV BOLUS
INTRAVENOUS | Status: DC | PRN
  Administered 2016-06-30: 120 mg via INTRAVENOUS
  Administered 2016-06-30: 40 mg via INTRAVENOUS

## 2016-06-30 MED ORDER — CEFAZOLIN SODIUM-DEXTROSE 2-4 GM/100ML-% IV SOLN
INTRAVENOUS | Status: AC
  Filled 2016-06-30: qty 100

## 2016-06-30 MED ORDER — LIDOCAINE HCL (CARDIAC) 20 MG/ML IV SOLN
INTRAVENOUS | Status: DC | PRN
  Administered 2016-06-30: 80 mg via INTRAVENOUS

## 2016-06-30 MED ORDER — TRAMADOL HCL 50 MG PO TABS: 50.0000 mg | ORAL_TABLET | Freq: Four times a day (QID) | ORAL | 0 refills | Status: AC | PRN

## 2016-06-30 MED ORDER — FENTANYL CITRATE (PF) 100 MCG/2ML IJ SOLN: 25.0000 ug | INTRAMUSCULAR | Status: DC | PRN

## 2016-06-30 MED ORDER — LIDOCAINE 2% (20 MG/ML) 5 ML SYRINGE
INTRAMUSCULAR | Status: AC
  Filled 2016-06-30: qty 5

## 2016-06-30 MED ORDER — MIDAZOLAM HCL 5 MG/5ML IJ SOLN
INTRAMUSCULAR | Status: DC | PRN
  Administered 2016-06-30 (×2): 0.5 mg via INTRAVENOUS

## 2016-06-30 MED ORDER — MIDAZOLAM HCL 2 MG/2ML IJ SOLN: 0.5000 mg | Freq: Once | INTRAMUSCULAR | Status: DC | PRN

## 2016-06-30 MED ORDER — ONDANSETRON HCL 4 MG/2ML IJ SOLN
INTRAMUSCULAR | Status: DC | PRN
  Administered 2016-06-30 (×2): 2 mg via INTRAVENOUS

## 2016-06-30 MED ORDER — ACETAMINOPHEN 10 MG/ML IV SOLN
INTRAVENOUS | Status: AC
  Filled 2016-06-30: qty 100

## 2016-06-30 MED ORDER — PHENYLEPHRINE 40 MCG/ML (10ML) SYRINGE FOR IV PUSH (FOR BLOOD PRESSURE SUPPORT)
PREFILLED_SYRINGE | INTRAVENOUS | Status: AC
  Filled 2016-06-30: qty 10

## 2016-06-30 MED ORDER — PROMETHAZINE HCL 25 MG/ML IJ SOLN: 6.2500 mg | INTRAMUSCULAR | Status: DC | PRN

## 2016-06-30 MED ORDER — PROPOFOL 10 MG/ML IV BOLUS
INTRAVENOUS | Status: AC
  Filled 2016-06-30: qty 20

## 2016-06-30 MED ORDER — LACTATED RINGERS IV SOLN
INTRAVENOUS | Status: DC | PRN
  Administered 2016-06-30 (×2): via INTRAVENOUS

## 2016-06-30 MED ORDER — PHENYLEPHRINE HCL 10 MG/ML IJ SOLN
INTRAMUSCULAR | Status: DC | PRN
  Administered 2016-06-30: 40 ug via INTRAVENOUS
  Administered 2016-06-30: 80 ug via INTRAVENOUS

## 2016-06-30 MED ORDER — ACETAMINOPHEN 10 MG/ML IV SOLN
INTRAVENOUS | Status: DC | PRN
Start: 2016-06-30 — End: 2016-06-30
  Administered 2016-06-30: 1000 mg via INTRAVENOUS

## 2016-06-30 MED ORDER — ONDANSETRON HCL 4 MG/2ML IJ SOLN
INTRAMUSCULAR | Status: AC
  Filled 2016-06-30: qty 2

## 2016-06-30 MED ORDER — LACTATED RINGERS IV SOLN
INTRAVENOUS | Status: DC
  Administered 2016-06-30: 1000 mL via INTRAVENOUS

## 2016-06-30 MED ORDER — DEXAMETHASONE SODIUM PHOSPHATE 10 MG/ML IJ SOLN
INTRAMUSCULAR | Status: AC
  Filled 2016-06-30: qty 1

## 2016-06-30 MED ORDER — FENTANYL CITRATE (PF) 100 MCG/2ML IJ SOLN
INTRAMUSCULAR | Status: AC
  Filled 2016-06-30: qty 2

## 2016-06-30 SURGICAL SUPPLY — 19 items
BAG URINE DRAINAGE (UROLOGICAL SUPPLIES) ×2 IMPLANT
BAG URO CATCHER STRL LF (MISCELLANEOUS) ×4 IMPLANT
CATH FOLEY 2WAY SLVR  5CC 16FR (CATHETERS) ×2
CATH FOLEY 2WAY SLVR 5CC 16FR (CATHETERS) IMPLANT
ELECT REM PT RETURN 9FT ADLT (ELECTROSURGICAL) ×4
ELECTRODE REM PT RTRN 9FT ADLT (ELECTROSURGICAL) ×2 IMPLANT
EVACUATOR MICROVAS BLADDER (UROLOGICAL SUPPLIES) IMPLANT
GLOVE BIOGEL M 8.0 STRL (GLOVE) ×4 IMPLANT
GOWN STRL REUS W/TWL LRG LVL3 (GOWN DISPOSABLE) ×4 IMPLANT
GOWN STRL REUS W/TWL XL LVL3 (GOWN DISPOSABLE) ×4 IMPLANT
LOOP CUT BIPOLAR 24F LRG (ELECTROSURGICAL) IMPLANT
MANIFOLD NEPTUNE II (INSTRUMENTS) ×4 IMPLANT
PACK CYSTO (CUSTOM PROCEDURE TRAY) ×4 IMPLANT
SET ASPIRATION TUBING (TUBING) IMPLANT
SYRINGE 10CC LL (SYRINGE) ×2 IMPLANT
SYRINGE IRR TOOMEY STRL 70CC (SYRINGE) IMPLANT
TUBING CONNECTING 10 (TUBING) ×3 IMPLANT
TUBING CONNECTING 10' (TUBING) ×1
TUBING INSUFFLATION 10FT LAP (TUBING) IMPLANT

## 2016-06-30 NOTE — Anesthesia Preprocedure Evaluation (Signed)
Anesthesia Evaluation  Patient identified by MRN, date of birth, ID band Patient awake    Reviewed: Allergy & Precautions, NPO status , Patient's Chart, lab work & pertinent test results  History of Anesthesia Complications Negative for: history of anesthetic complications  Airway Mallampati: II  TM Distance: >3 FB Neck ROM: Full    Dental  (+) Dental Advisory Given   Pulmonary former smoker,    breath sounds clear to auscultation       Cardiovascular hypertension, Pt. on medications (-) angina+ Past MI   Rhythm:Regular Rate:Normal  '14 ECHO: EF 60-65%, valves OK   Neuro/Psych CVA (L weakness), Residual Symptoms    GI/Hepatic GERD  Medicated and Controlled,(+) Hepatitis -  Endo/Other  diabetes (glu 176), Insulin Dependent  Renal/GU negative Renal ROS     Musculoskeletal  (+) Arthritis , Rheumatoid disorders,    Abdominal   Peds  Hematology   Anesthesia Other Findings   Reproductive/Obstetrics                             Anesthesia Physical Anesthesia Plan  ASA: III  Anesthesia Plan: General   Post-op Pain Management:    Induction: Intravenous  Airway Management Planned: LMA  Additional Equipment:   Intra-op Plan:   Post-operative Plan:   Informed Consent: I have reviewed the patients History and Physical, chart, labs and discussed the procedure including the risks, benefits and alternatives for the proposed anesthesia with the patient or authorized representative who has indicated his/her understanding and acceptance.   Dental advisory given  Plan Discussed with: CRNA and Surgeon  Anesthesia Plan Comments: (Plan routine monitors, GA- LMA OK)        Anesthesia Quick Evaluation

## 2016-06-30 NOTE — Transfer of Care (Signed)
Immediate Anesthesia Transfer of Care Note  Patient: Tracey Mason  Procedure(s) Performed: Procedure(s): CYSTOSCOPY WITH BIOPSY AND URINE CYTOLOGY (N/A) CYSTOSCOPY WITH FULGERATION (N/A) CYSTOSCOPY WITH RETROGRADE PYELOGRAM (Bilateral)  Patient Location: PACU  Anesthesia Type:General  Level of Consciousness: awake, oriented, patient cooperative, lethargic and responds to stimulation  Airway & Oxygen Therapy: Patient Spontanous Breathing and Patient connected to face mask oxygen  Post-op Assessment: Report given to RN, Post -op Vital signs reviewed and stable and Patient moving all extremities  Post vital signs: Reviewed and stable  Last Vitals:  Vitals:   06/30/16 1110  BP: 127/64  Pulse: 99  Resp: 18  Temp: 36.4 C    Last Pain:  Vitals:   06/30/16 1110  TempSrc: Oral      Patients Stated Pain Goal: 4 (06/30/16 1204)  Complications: No apparent anesthesia complications

## 2016-06-30 NOTE — Anesthesia Postprocedure Evaluation (Signed)
Anesthesia Post Note  Patient: Tracey Mason  Procedure(s) Performed: Procedure(s) (LRB): CYSTOSCOPY WITH BIOPSY AND URINE CYTOLOGY (N/A) CYSTOSCOPY WITH FULGERATION (N/A) CYSTOSCOPY WITH RETROGRADE PYELOGRAM (Bilateral)  Patient location during evaluation: PACU Anesthesia Type: General Level of consciousness: awake and alert, oriented and patient cooperative Pain management: pain level controlled Vital Signs Assessment: post-procedure vital signs reviewed and stable Respiratory status: spontaneous breathing, nonlabored ventilation and respiratory function stable Cardiovascular status: blood pressure returned to baseline and stable Postop Assessment: no signs of nausea or vomiting Anesthetic complications: no    Last Vitals:  Vitals:   06/30/16 1523 06/30/16 1620  BP: (!) 100/55 (!) 98/56  Pulse: (!) 106 (!) 106  Resp:  20  Temp: 36.9 C 36.7 C    Last Pain:  Vitals:   06/30/16 1620  TempSrc: Oral  PainSc:                  Caydence Koenig,E. Toris Laverdiere

## 2016-06-30 NOTE — Brief Op Note (Signed)
06/30/2016  1:43 PM  PATIENT:  Mervin Kung  70 y.o. female  PRE-OPERATIVE DIAGNOSIS:  TRIGONE BLADDER LESION  POST-OPERATIVE DIAGNOSIS:  TRIGONE BLADDER LESION  PROCEDURE:  Procedure(s): CYSTOSCOPY WITH BIOPSY AND URINE CYTOLOGY (N/A) CYSTOSCOPY WITH FULGERATION (N/A) CYSTOSCOPY WITH RETROGRADE PYELOGRAM (Bilateral)  SURGEON:  Surgeon(s) and Role:    * Malen Gauze, MD - Primary  PHYSICIAN ASSISTANT:   ASSISTANTS: none   ANESTHESIA:   general  EBL:  No intake/output data recorded.  BLOOD ADMINISTERED:none  DRAINS: Urinary Catheter (Foley)   LOCAL MEDICATIONS USED:  NONE  SPECIMEN:  Source of Specimen:  posterior bladder wall x 3  DISPOSITION OF SPECIMEN:  PATHOLOGY  COUNTS:  YES  TOURNIQUET:  * No tourniquets in log *  DICTATION: .Note written in EPIC  PLAN OF CARE: Discharge to home after PACU  PATIENT DISPOSITION:  PACU - hemodynamically stable.   Delay start of Pharmacological VTE agent (>24hrs) due to surgical blood loss or risk of bleeding: not applicable

## 2016-06-30 NOTE — Discharge Instructions (Signed)
Foley Catheter Care, Adult A Foley catheter is a soft, flexible tube. This tube is placed into your bladder to drain pee (urine). If you go home with this catheter in place, follow the instructions below. TAKING CARE OF THE CATHETER 1. Wash your hands with soap and water. 2. Put soap and water on a clean washcloth.  Clean the skin where the tube goes into your body.  Clean away from the tube site.  Never wipe toward the tube.  Clean the area using a circular motion.  Remove all the soap. Pat the area dry with a clean towel. For males, reposition the skin that covers the end of the penis (foreskin). 3. Attach the tube to your leg with tape or a leg strap. Do not stretch the tube tight. If you are using tape, remove any stickiness left behind by past tape you used. 4. Keep the drainage bag below your hips. Keep it off the floor. 5. Check your tube during the day. Make sure it is working and draining. Make sure the tube does not curl, twist, or bend. 6. Do not pull on the tube or try to take it out. TAKING CARE OF THE DRAINAGE BAGS You will have a large overnight drainage bag and a small leg bag. You may wear the overnight bag any time. Never wear the small bag at night. Follow the directions below. Emptying the Drainage Bag Empty your drainage bag when it is  - full or at least 2-3 times a day. 1. Wash your hands with soap and water. 2. Keep the drainage bag below your hips. 3. Hold the dirty bag over the toilet or clean container. 4. Open the pour spout at the bottom of the bag. Empty the pee into the toilet or container. Do not let the pour spout touch anything. 5. Clean the pour spout with a gauze pad or cotton ball that has rubbing alcohol on it. 6. Close the pour spout. 7. Attach the bag to your leg with tape or a leg strap. 8. Wash your hands well. Changing the Drainage Bag Change your bag once a month or sooner if it starts to smell or look dirty.  1. Wash your hands with soap  and water. 2. Pinch the rubber tube so that pee does not spill out. 3. Disconnect the catheter tube from the drainage tube at the connection valve. Do not let the tubes touch anything. 4. Clean the end of the catheter tube with an alcohol wipe. Clean the end of a the drainage tube with a different alcohol wipe. 5. Connect the catheter tube to the drainage tube of the clean drainage bag. 6. Attach the new bag to the leg with tape or a leg strap. Avoid attaching the new bag too tightly. 7. Wash your hands well. Cleaning the Drainage Bag 1. Wash your hands with soap and water. 2. Wash the bag in warm, soapy water. 3. Rinse the bag with warm water. 4. Fill the bag with a mixture of white vinegar and water (1 cup vinegar to 1 quart warm water [.2 liter vinegar to 1 liter warm water]). Close the bag and soak it for 30 minutes in the solution. 5. Rinse the bag with warm water. 6. Hang the bag to dry with the pour spout open and hanging downward. 7. Store the clean bag (once it is dry) in a clean plastic bag. 8. Wash your hands well. PREVENT INFECTION  Wash your hands before and after touching your tube.  Take showers every day. Wash the skin where the tube enters your body. Do not take baths. Replace wet leg straps with dry ones, if this applies.  Do not use powders, sprays, or lotions on the genital area. Only use creams, lotions, or ointments as told by your doctor.  For females, wipe from front to back after going to the bathroom.  Drink enough fluids to keep your pee clear or pale yellow unless you are told not to have too much fluid (fluid restriction).  Do not let the drainage bag or tubing touch or lie on the floor.  Wear cotton underwear to keep the area dry. GET HELP IF:  Your pee is cloudy or smells unusually bad.  Your tube becomes clogged.  You are not draining pee into the bag or your bladder feels full.  Your tube starts to leak. GET HELP RIGHT AWAY IF:  You have  pain, puffiness (swelling), redness, or yellowish-white fluid (pus) where the tube enters the body.  You have pain in the belly (abdomen), legs, lower back, or bladder.  You have a fever.  You see blood fill the tube, or your pee is pink or red.  You feel sick to your stomach (nauseous), throw up (vomit), or have chills.  Your tube gets pulled out. MAKE SURE YOU:   Understand these instructions.  Will watch your condition.  Will get help right away if you are not doing well or get worse.   This information is not intended to replace advice given to you by your health care provider. Make sure you discuss any questions you have with your health care provider.   Document Released: 12/06/2012 Document Revised: 09/01/2014 Document Reviewed: 12/06/2012 Elsevier Interactive Patient Education 2016 [employer]   General Anesthesia, Adult, Care After Refer to this sheet in the next few weeks. These instructions provide you with information on caring for yourself after your procedure. Your health care provider may also give you more specific instructions. Your treatment has been planned according to current medical practices, but problems sometimes occur. Call your health care provider if you have any problems or questions after your procedure. WHAT TO EXPECT AFTER THE PROCEDURE After the procedure, it is typical to experience:  Sleepiness.  Nausea and vomiting. HOME CARE INSTRUCTIONS  For the first 24 hours after general anesthesia:  Have a responsible person with you.  Do not drive a car. If you are alone, do not take public transportation.  Do not drink alcohol.  Do not take medicine that has not been prescribed by your health care provider.  Do not sign important papers or make important decisions.  You may resume a normal diet and activities as directed by your health care provider.  Change bandages (dressings) as directed.  If you have questions or problems that seem  related to general anesthesia, call the hospital and ask for the anesthetist or anesthesiologist on call. SEEK MEDICAL CARE IF:  You have nausea and vomiting that continue the day after anesthesia.  You develop a rash. SEEK IMMEDIATE MEDICAL CARE IF:   You have difficulty breathing.  You have chest pain.  You have any allergic problems.   This information is not intended to replace advice given to you by your health care provider. Make sure you discuss any questions you have with your health care provider.   Document Released: 11/17/2000 Document Revised: 09/01/2014 Document Reviewed: 12/10/2011 Elsevier Interactive Patient Education [employer]

## 2016-06-30 NOTE — Anesthesia Procedure Notes (Signed)
Procedure Name: LMA Insertion Date/Time: 06/30/2016 2:03 PM Performed by: Jairo Ben Pre-anesthesia Checklist: Patient identified, Emergency Drugs available, Suction available, Patient being monitored and Timeout performed Patient Re-evaluated:Patient Re-evaluated prior to inductionOxygen Delivery Method: Circle system utilized Preoxygenation: Pre-oxygenation with 100% oxygen Intubation Type: IV induction Ventilation: Mask ventilation without difficulty LMA: LMA inserted LMA Size: 3.0 Number of attempts: 1 Placement Confirmation: positive ETCO2 and breath sounds checked- equal and bilateral Secured at: 21 cm Tube secured with: Tape Dental Injury: Teeth and Oropharynx as per pre-operative assessment

## 2016-06-30 NOTE — Progress Notes (Signed)
Report called to Teresita (nurse at [facility]).

## 2016-06-30 NOTE — H&P (Signed)
Urology Admission H&P  Chief Complaint: dysuria  History of Present Illness: Tracey Mason is a 70yo with dysuria who was found to have bladder lesions on office cystoscopy  Past Medical History:  Diagnosis Date  . Abnormality of gait 09/26/2013  . Acid reflux   . Anemia    iron deficiency   . Bell's palsy    Left  . CHF (congestive heart failure) (HCC)    chronic diastolic   . Cognitive communication deficit   . Depression   . Diabetes mellitus    insulin dependent, type II  . Diverticular disease   . H/O hiatal hernia   . Hepatitis    Hepatitis B  . Hx of falling   . Hypercholesterolemia   . Hypertension   . Lack of coordination   . Low back pain   . Memory deficit 09/26/2013  . Myocardial infarction    ' i have been told I did "  . Osteoarthritis   . Osteoporosis   . Rheumatoid arthritis(714.0)   . Stroke (HCC)    mild memory loss  . Urinary tract infection    hx of    Past Surgical History:  Procedure Laterality Date  . ABDOMINAL HYSTERECTOMY    . APPENDECTOMY    . CATARACT EXTRACTION Bilateral   . CHOLECYSTECTOMY    . ESOPHAGEAL DILATION     esophageal stricture  . FEMUR IM NAIL Left 04/20/2015   Procedure: LEFT HIP INTRAMEDULLARY NAIL FEMORAL ;  Surgeon: Durene Romans, MD;  Location: [facility];  Service: Orthopedics;  Laterality: Left;  . FRACTURE SURGERY    . ORIF TIBIA FRACTURE  01/15/2012   tib /fib fracture  . TONSILLECTOMY      Home Medications:  Prescriptions Prior to Admission  Medication Sig Dispense Refill Last Dose  . ARTIFICIAL TEAR OINTMENT OP Place 1 application into the left eye at bedtime.   06/29/2016 at Unknown time  . Ascorbic Acid (VITAMIN C) 1000 MG tablet Take 1,000 mg by mouth daily.     06/29/2016 at Unknown time  . aspirin EC 325 MG EC tablet Take 1 tablet (325 mg total) by mouth 2 (two) times daily. 30 tablet 0 06/29/2016 at Unknown time  . calcitonin, salmon, (MIACALCIN/FORTICAL) 200 UNIT/ACT nasal spray Place 1 spray into alternate  nostrils daily.   06/29/2016 at Unknown time  . Calcium Citrate-Vitamin D (CITRUS CALCIUM/VITAMIN D) 200-250 MG-UNIT TABS Take 1 tablet by mouth 2 (two) times daily.   06/29/2016 at Unknown time  . DULoxetine (CYMBALTA) 60 MG capsule Take 60 mg by mouth daily.   06/29/2016 at Unknown time  . insulin aspart (NOVOLOG) 100 UNIT/ML injection Inject 24 Units into the skin 3 (three) times daily with meals.   06/29/2016 at Unknown time  . insulin detemir (LEVEMIR) 100 UNIT/ML injection Inject 33 Units into the skin 2 (two) times daily.   06/30/2016 at Unknown time  . lisinopril (PRINIVIL,ZESTRIL) 10 MG tablet Take 1 tablet (10 mg total) by mouth daily. 90 tablet 0 06/29/2016 at Unknown time  . loratadine (CLARITIN) 10 MG tablet Take 10 mg by mouth daily.   06/29/2016 at Unknown time  . metoCLOPramide (REGLAN) 5 MG tablet Take 5 mg by mouth 3 (three) times daily before meals.   06/29/2016 at Unknown time  . ondansetron (ZOFRAN) 4 MG tablet Take 4 mg by mouth every 6 (six) hours as needed for nausea.     . pantoprazole (PROTONIX) 40 MG tablet Take 40 mg by mouth 2 (  two) times daily.    06/29/2016 at Unknown time  . phenol (CHLORASEPTIC) 1.4 % LIQD Use as directed 2 sprays in the mouth or throat every 4 (four) hours as needed for throat irritation / pain.     . polyethylene glycol (MIRALAX / GLYCOLAX) packet Take 17 g by mouth 3 (three) times daily.     . polyvinyl alcohol (LIQUIFILM TEARS) 1.4 % ophthalmic solution Place 1 drop into both eyes 4 (four) times daily as needed for dry eyes.     Marland Kitchen sennosides-docusate sodium (SENOKOT-S) 8.6-50 MG tablet Take 1 tablet by mouth 2 (two) times daily.     . traMADol (ULTRAM) 50 MG tablet Take 50 mg by mouth every 6 (six) hours as needed (for pain).     Marland Kitchen trimethoprim (TRIMPEX) 100 MG tablet Take 100 mg by mouth daily.     . Vitamin D, Ergocalciferol, (DRISDOL) 50000 units CAPS capsule Take 50,000 Units by mouth 2 (two) times a week.   06/29/2016 at Unknown time   Allergies:   Allergies  Allergen Reactions  . Prednisone Other (See Comments)    Causes stroke  . Cortisone Other (See Comments)    Causes stroke    Family History  Problem Relation Age of Onset  . Diabetes type II Mother   . Coronary artery disease Mother   . Stroke Father   . Cancer Sister     stomach  . Cancer Sister     colon  . Cancer Brother   . Diabetes type II Brother   . Diabetes type II    . Coronary artery disease     Social History:  reports that she quit smoking about 21 years ago. Her smoking use included Cigarettes. She has never used smokeless tobacco. She reports that she drinks about 0.5 oz of alcohol per week . She reports that she does not use drugs.  Review of Systems  All other systems reviewed and are negative.   Physical Exam:  Vital signs in last 24 hours: Temp:  [97.5 F (36.4 C)] 97.5 F (36.4 C) (11/06 1110) Pulse Rate:  [99] 99 (11/06 1110) Resp:  [18] 18 (11/06 1110) BP: (127)/(64) 127/64 (11/06 1110) SpO2:  [100 %] 100 % (11/06 1110) Weight:  [59 kg (130 lb)] 59 kg (130 lb) (11/06 1110) Physical Exam  Constitutional: She is oriented to person, place, and time. She appears well-developed and well-nourished.  HENT:  Head: Normocephalic and atraumatic.  Eyes: EOM are normal. Pupils are equal, round, and reactive to light.  Neck: Normal range of motion. No thyromegaly present.  Cardiovascular: Normal rate and regular rhythm.   Respiratory: Effort normal. No respiratory distress.  GI: Soft. She exhibits no distension.  Musculoskeletal: Normal range of motion. She exhibits no edema.  Neurological: She is alert and oriented to person, place, and time.  Skin: Skin is warm and dry.  Psychiatric: She has a normal mood and affect. Her behavior is normal. Judgment and thought content normal.    Laboratory Data:  Results for orders placed or performed during the hospital encounter of 06/30/16 (from the past 24 hour(s))  Basic metabolic panel     Status:  Abnormal   Collection Time: 06/30/16 11:06 AM  Result Value Ref Range   Sodium 135 135 - 145 mmol/L   Potassium 4.3 3.5 - 5.1 mmol/L   Chloride 101 101 - 111 mmol/L   CO2 26 22 - 32 mmol/L   Glucose, Bld 170 (H) 65 - 99  mg/dL   BUN 19 6 - 20 mg/dL   Creatinine, Ser 3.87 (H) 0.44 - 1.00 mg/dL   Calcium 9.2 8.9 - 56.4 mg/dL   GFR calc non Af Amer 39 (L) >60 mL/min   GFR calc Af Amer 45 (L) >60 mL/min   Anion gap 8 5 - 15  CBC     Status: None   Collection Time: 06/30/16 11:06 AM  Result Value Ref Range   WBC 9.4 4.0 - 10.5 K/uL   RBC 3.88 3.87 - 5.11 MIL/uL   Hemoglobin 12.8 12.0 - 15.0 g/dL   HCT 33.2 95.1 - 88.4 %   MCV 96.4 78.0 - 100.0 fL   MCH 33.0 26.0 - 34.0 pg   MCHC 34.2 30.0 - 36.0 g/dL   RDW 16.6 06.3 - 01.6 %   Platelets 298 150 - 400 K/uL  Glucose, capillary     Status: Abnormal   Collection Time: 06/30/16 11:27 AM  Result Value Ref Range   Glucose-Capillary 176 (H) 65 - 99 mg/dL   No results found for this or any previous visit (from the past 240 hour(s)). Creatinine:  Recent Labs  06/30/16 1106  CREATININE 1.34*   Baseline Creatinine: 1.3  Impression/Assessment:  70yo with bladder lesions  Plan:  The risks/benefits/alternatives to bladder biopsy was explained to the patient and she understands and wishes to proceed with surgery  Tracey Mason 06/30/2016, 12:48 PM

## 2016-07-01 LAB — HEMOGLOBIN A1C
Hgb A1c MFr Bld: 7.8 % — ABNORMAL HIGH (ref 4.8–5.6)
Mean Plasma Glucose: 177 mg/dL

## 2016-07-14 NOTE — Op Note (Signed)
Preoperative diagnosis: bladder lesions  Postoperative diagnosis: Same  Procedure: 1 cystoscopy 2. bilateral retrograde pyelography 3. Intraoperative fluoroscopy, under one hour, with interpretation 4. Bladder biopsy with fulgeration  Attending: Wilkie Aye  Anesthesia: General  Estimated blood loss: Minimal  Drains: 20 French foley  Specimens:  Bladder biopsies x 3  Antibiotics: ancef  Findings:  Ureteral orifices in normal anatomic location. No hydronephrosis or filling defects in either collecting system. Numerous erythematous lesions involving the trigone and posterior wall  Indications: Patient is a 70 year old female with a history of bladder lesions found on office cystoscopy. After discussing treatment options, they decided proceed with bladder biopsy.  Procedure her in detail: The patient was brought to the operating room and a brief timeout was done to ensure correct patient, correct procedure, correct site. General anesthesia was administered patient was placed in dorsal lithotomy position. Their genitalia was then prepped and draped in usual sterile fashion. A rigid 22 French cystoscope was passed in the urethra and the bladder. Bladder was inspected and we noted trigone and posterior wall erythematous lesions. the ureteral orifices were in the normal orthotopic locations. a 6 french ureteral catheter was then instilled into the left ureteral orifice. a gentle retrograde was obtained and findings noted above. We then turned our attention to the right side. a 6 french ureteral catheter was then instilled into the right ureteral orifice. a gentle retrograde was obtained and findings noted above. We then removed the cystoscope and placed a resectoscope into the bladder. We proceeded to obtain multiple biopsies from the posterior wall where there were areas of erythema. Hemostasis was then obtained with a bugbee. the bladder was then drained, a 20 French foley was  placed and this concluded the procedure which was well tolerated by patient.  Complications: None  Condition: Stable, extubated, transferred to PACU  Plan: Patient will be discharged home and will followup in 5 days for voiding trial

## 2017-04-29 ENCOUNTER — Encounter: Payer: Self-pay | Admitting: *Deleted

## 2017-04-30 ENCOUNTER — Ambulatory Visit (INDEPENDENT_AMBULATORY_CARE_PROVIDER_SITE_OTHER): Payer: Medicaid Other | Admitting: Neurology

## 2017-04-30 ENCOUNTER — Encounter: Payer: Self-pay | Admitting: Neurology

## 2017-04-30 VITALS — BP 104/61 | HR 110 | Ht 60.0 in

## 2017-04-30 DIAGNOSIS — F0391 Unspecified dementia with behavioral disturbance: Secondary | ICD-10-CM

## 2017-04-30 DIAGNOSIS — R269 Unspecified abnormalities of gait and mobility: Secondary | ICD-10-CM

## 2017-04-30 NOTE — Progress Notes (Signed)
Reason for visit: Parkinsonism  Referring physician: Dr. Manuella Ghazi TENAE [name] is a 71 y.o. female  History of present illness:  Tracey Mason is a 71 year old right-handed white female who was seen previously in February 2015 through this office for a gait disorder. MRI of the brain that was done at that time showed mild to moderate small vessel changes, MRI of the cervical spine did not show evidence of spinal cord impingement. The patient has had some progression of her ability to ambulate, she has had multiple falls involving fractures of both legs and both hips. The patient has had a fractured the left shoulder, she has diffuse degenerative arthritis and she will be seen by a rheumatologist in the near future. The patient has not been ambulatory since 2016, she is wheelchair-bound. She has had a progressive decline in her memory. At some point, she has been started on Reglan taking 5 mg 3 times daily. The patient has ongoing problems with delusional thinking, she believes that rats are inside of her body and are eating her. She may occasionally have hallucinations as well. She does not consistently recognize family members. She may have periods of agitation. The patient is sent to this office for further evaluation.  Past Medical History:  Diagnosis Date  . Abnormality of gait 09/26/2013  . Acid reflux   . Anemia    iron deficiency   . Bell's palsy    Left  . CHF (congestive heart failure) (HCC)    chronic diastolic   . Cognitive communication deficit   . Depression   . Diabetes mellitus    insulin dependent, type II  . Diverticular disease   . H/O hiatal hernia   . Hepatitis    Hepatitis B  . Hx of falling   . Hypercholesterolemia   . Hypertension   . Lack of coordination   . Low back pain   . Memory deficit 09/26/2013  . Myocardial infarction [facility])    ' i have been told I did "  . Osteoarthritis   . Osteoporosis   . Rheumatoid arthritis(714.0)   . Stroke (HCC)    mild memory loss  . Urinary tract infection    hx of     Past Surgical History:  Procedure Laterality Date  . ABDOMINAL HYSTERECTOMY    . APPENDECTOMY    . CATARACT EXTRACTION Bilateral   . CHOLECYSTECTOMY    . CYSTOSCOPY W/ RETROGRADES Bilateral 06/30/2016   Procedure: CYSTOSCOPY WITH RETROGRADE PYELOGRAM;  Surgeon: Malen Gauze, MD;  Location: [facility];  Service: Urology;  Laterality: Bilateral;  . CYSTOSCOPY WITH BIOPSY N/A 06/30/2016   Procedure: CYSTOSCOPY WITH BIOPSY AND URINE CYTOLOGY;  Surgeon: Malen Gauze, MD;  Location: [facility];  Service: Urology;  Laterality: N/A;  . CYSTOSCOPY WITH FULGERATION N/A 06/30/2016   Procedure: CYSTOSCOPY WITH FULGERATION;  Surgeon: Malen Gauze, MD;  Location: [facility];  Service: Urology;  Laterality: N/A;  . ESOPHAGEAL DILATION     esophageal stricture  . FEMUR IM NAIL Left 04/20/2015   Procedure: LEFT HIP INTRAMEDULLARY NAIL FEMORAL ;  Surgeon: Durene Romans, MD;  Location: [facility];  Service: Orthopedics;  Laterality: Left;  . FRACTURE SURGERY    . ORIF TIBIA FRACTURE  01/15/2012   tib /fib fracture  . TONSILLECTOMY      Family History  Problem Relation Age of Onset  . Diabetes type II Mother   . Coronary artery disease Mother   . Stroke Father   .  Cancer Sister        stomach  . Cancer Sister        colon  . Cancer Brother   . Diabetes type II Brother   . Diabetes type II Unknown   . Coronary artery disease Unknown     Social history:  reports that she quit smoking about 22 years ago. Her smoking use included Cigarettes. She has never used smokeless tobacco. She reports that she drinks about 0.5 oz of alcohol per week . She reports that she does not use drugs.  Medications:  Prior to Admission medications   Medication Sig Start Date End Date Taking? Authorizing Provider  ARTIFICIAL TEAR OINTMENT OP Place 1 application into the left eye at bedtime.    [provider]  aspirin EC 325 MG EC tablet Take 1 tablet  (325 mg total) by mouth 2 (two) times daily. 04/23/15   Leatha Gilding, MD  calcitonin, salmon, (MIACALCIN/FORTICAL) 200 UNIT/ACT nasal spray Place 1 spray into alternate nostrils daily.    [provider]  DULoxetine (CYMBALTA) 60 MG capsule Take 60 mg by mouth daily.    [provider]  fluticasone (FLONASE) 50 MCG/ACT nasal spray Place into both nostrils daily.    [provider]  gabapentin (NEURONTIN) 100 MG capsule Take 200 mg by mouth every morning. 2 capsules at bedtime also    [provider]  insulin detemir (LEVEMIR) 100 UNIT/ML injection Inject 33 Units into the skin 2 (two) times daily.    [provider]  insulin lispro (HUMALOG KWIKPEN) 100 UNIT/ML KiwkPen Inject 24 Units into the skin 3 (three) times daily.    [provider]  lisinopril (PRINIVIL,ZESTRIL) 10 MG tablet Take 1 tablet (10 mg total) by mouth daily. 02/27/13   Drema Dallas, MD  loratadine (CLARITIN) 10 MG tablet Take 10 mg by mouth daily.    [provider]  metoCLOPramide (REGLAN) 5 MG tablet Take 5 mg by mouth 3 (three) times daily before meals.    [provider]  mirabegron ER (MYRBETRIQ) 50 MG TB24 tablet Take 50 mg by mouth daily.    [provider]  ondansetron (ZOFRAN) 4 MG tablet Take 4 mg by mouth every 6 (six) hours as needed for nausea.    [provider]  pantoprazole (PROTONIX) 40 MG tablet Take 40 mg by mouth 2 (two) times daily.     [provider]  phenol (CHLORASEPTIC) 1.4 % LIQD Use as directed 2 sprays in the mouth or throat every 4 (four) hours as needed for throat irritation / pain.    [provider]  polyethylene glycol (MIRALAX / GLYCOLAX) packet Take 17 g by mouth 3 (three) times daily.    [provider]  polyvinyl alcohol (LIQUIFILM TEARS) 1.4 % ophthalmic solution Place 1 drop into both eyes 4 (four) times daily as needed for dry eyes.    [provider]    sennosides-docusate sodium (SENOKOT-S) 8.6-50 MG tablet Take 1 tablet by mouth 2 (two) times daily.    [provider]  traMADol (ULTRAM) 50 MG tablet Take 1 tablet (50 mg total) by mouth every 6 (six) hours as needed (for pain). Patient taking differently: Take 50 mg by mouth 2 (two) times daily.  06/30/16   McKenzie, Mardene Celeste, MD  trimethoprim (TRIMPEX) 100 MG tablet Take 100 mg by mouth daily.    [provider]  Vitamin D, Ergocalciferol, (DRISDOL) 50000 units CAPS capsule Take 50,000 Units by  mouth 2 (two) times a week.    [provider]  Rayburn Ma Oil (ARTIFICIAL TEARS) OINT ophthalmic ointment Place 1 application into the left eye at bedtime.    [provider]      Allergies  Allergen Reactions  . Prednisone Other (See Comments)    Causes stroke  . Cortisone Other (See Comments)    Causes stroke    ROS:  Out of a complete 14 system review of symptoms, the patient complains only of the following symptoms, and all other reviewed systems are negative.  Decreased activity and appetite, fatigue Choking Leg swelling Excessive thirst Constipation, incontinence of the bowels Painful urination, incontinence of bladder, frequency of urination, urinary urgency Walking difficulty Memory loss, dizziness, numbness, speech difficulty, weakness, tremors Agitation, confusion, decreased concentration, depression, anxiety, hallucinations  Blood pressure 104/61, pulse (!) 110, height 5' (1.524 m), SpO2 96 %.  Physical Exam  General: The patient is alert and cooperative at the time of the examination.  Eyes: Pupils are equal, round, and reactive to light. Discs are flat bilaterally.  Neck: The neck is supple, no carotid bruits are noted.  Respiratory: The respiratory examination is clear.  Cardiovascular: The cardiovascular examination reveals a regular rate and rhythm, no obvious murmurs or rubs are noted.  Skin: Extremities are  without significant edema.  Neurologic Exam  Mental status: The patient is alert and oriented x 2 at the time of the examination (not oriented to date). The Mini-Mental Status Examination done today shows a total score of 11/30.  Cranial nerves: Facial symmetry is present. There is good sensation of the face to pinprick and soft touch bilaterally. The strength of the facial muscles and the muscles to head turning and shoulder shrug are normal bilaterally. Speech is well enunciated, no aphasia or dysarthria is noted. Extraocular movements are full, with except that there is some incomplete superior deviation of the eyes. Visual fields are full. The tongue is midline, and the patient has symmetric elevation of the soft palate. No obvious hearing deficits are noted. Masking of the face is seen.  Motor: The motor testing reveals 5 over 5 strength of all 4 extremities. Good symmetric motor tone is noted throughout.  Sensory: Sensory testing is intact to pinprick, soft touch and vibration sensation on all 4 extremities. No evidence of extinction is noted.  Coordination: Cerebellar testing reveals good finger-nose-finger bilaterally. The patient does have some apraxia with the use of the extremities, she has difficulty performing heel-to-shin bilaterally.  Gait and station: Gait could not be tested, the patient is wheelchair-bound. Unable to straighten the right leg at the knee, remains flexed at about 90.  Reflexes: Deep tendon reflexes are symmetric, but are depressed bilaterally. Toes are equivocal bilaterally.   Assessment/Plan:  1. Progressive dementing illness  2. Parkinsonism, on Reglan  3. Severe gait disorder  At this point, the patient has a very significant dementia with delusional thinking and agitation, she also has a severe gait disorder, she has not been ambulatory since 2016. The patient is on Reglan, this may produce a secondary parkinsonism. The patient has a flexion  contracture of the right knee. I am not clear that any further workup is likely to result in any treatment modality that would result in any significant functional improvement with this patient. Stopping the Reglan if possible could be done to help improve some of the features of parkinsonism. Ativan can be added at 0.5 mg up to 3 times a day if needed for  agitation. The patient has delusional thinking which is very difficult to control with medications, however. The patient will follow-up through this office if needed.  Marlan Palau MD 04/30/2017 3:12 PM  [facility] [address] [place], [place] [address]  Phone [phone] Fax [phone]

## 2017-08-09 IMAGING — CR DG HIP (WITH OR WITHOUT PELVIS) 2-3V*L*
3 series · 3 of 3 positions shown · non-contrast
Comparison: 01/26/2015

ADDENDUM:
CORRECTED REPORT:

Fracture is of the intertrochanteric left FEMUR.
CLINICAL DATA: Fall. Hip pain, external rotation comment
shortening. Trip and fall injury landing on the left hip.
EXAM:
DG HIP (WITH OR WITHOUT PELVIS) 2-3V LEFT

[x pelvis]
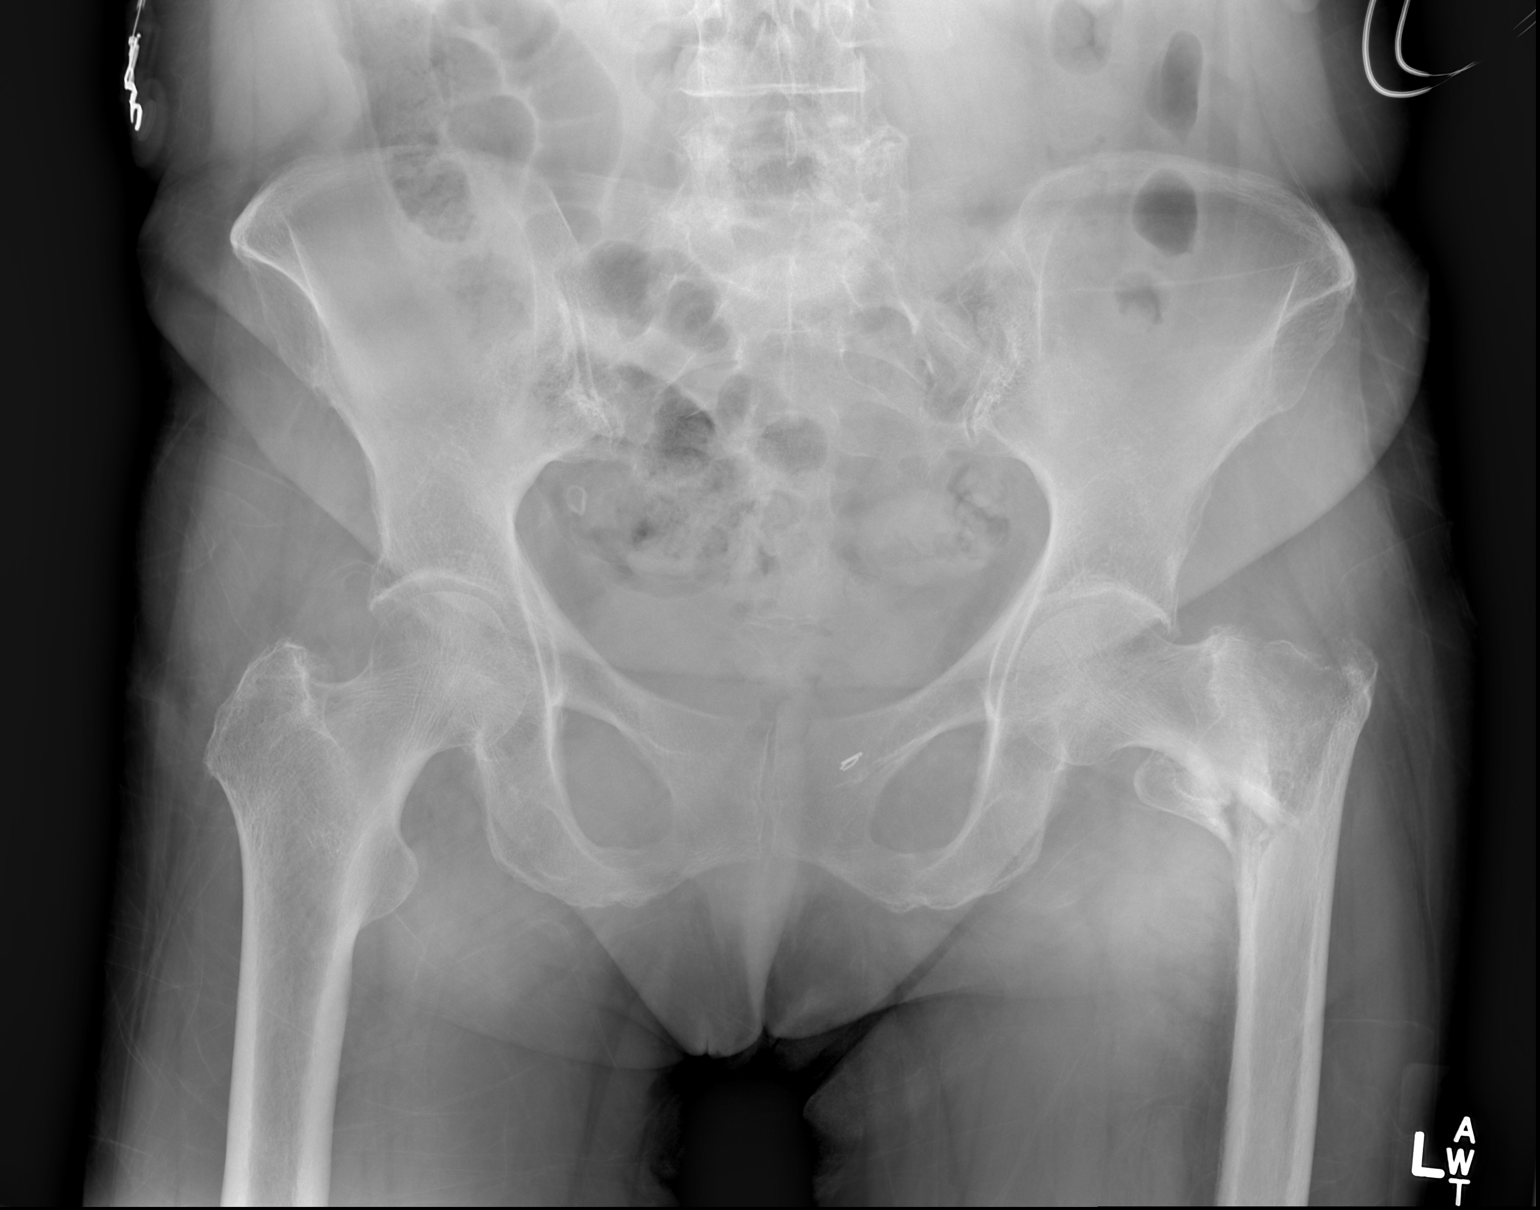

[x hip ap left]
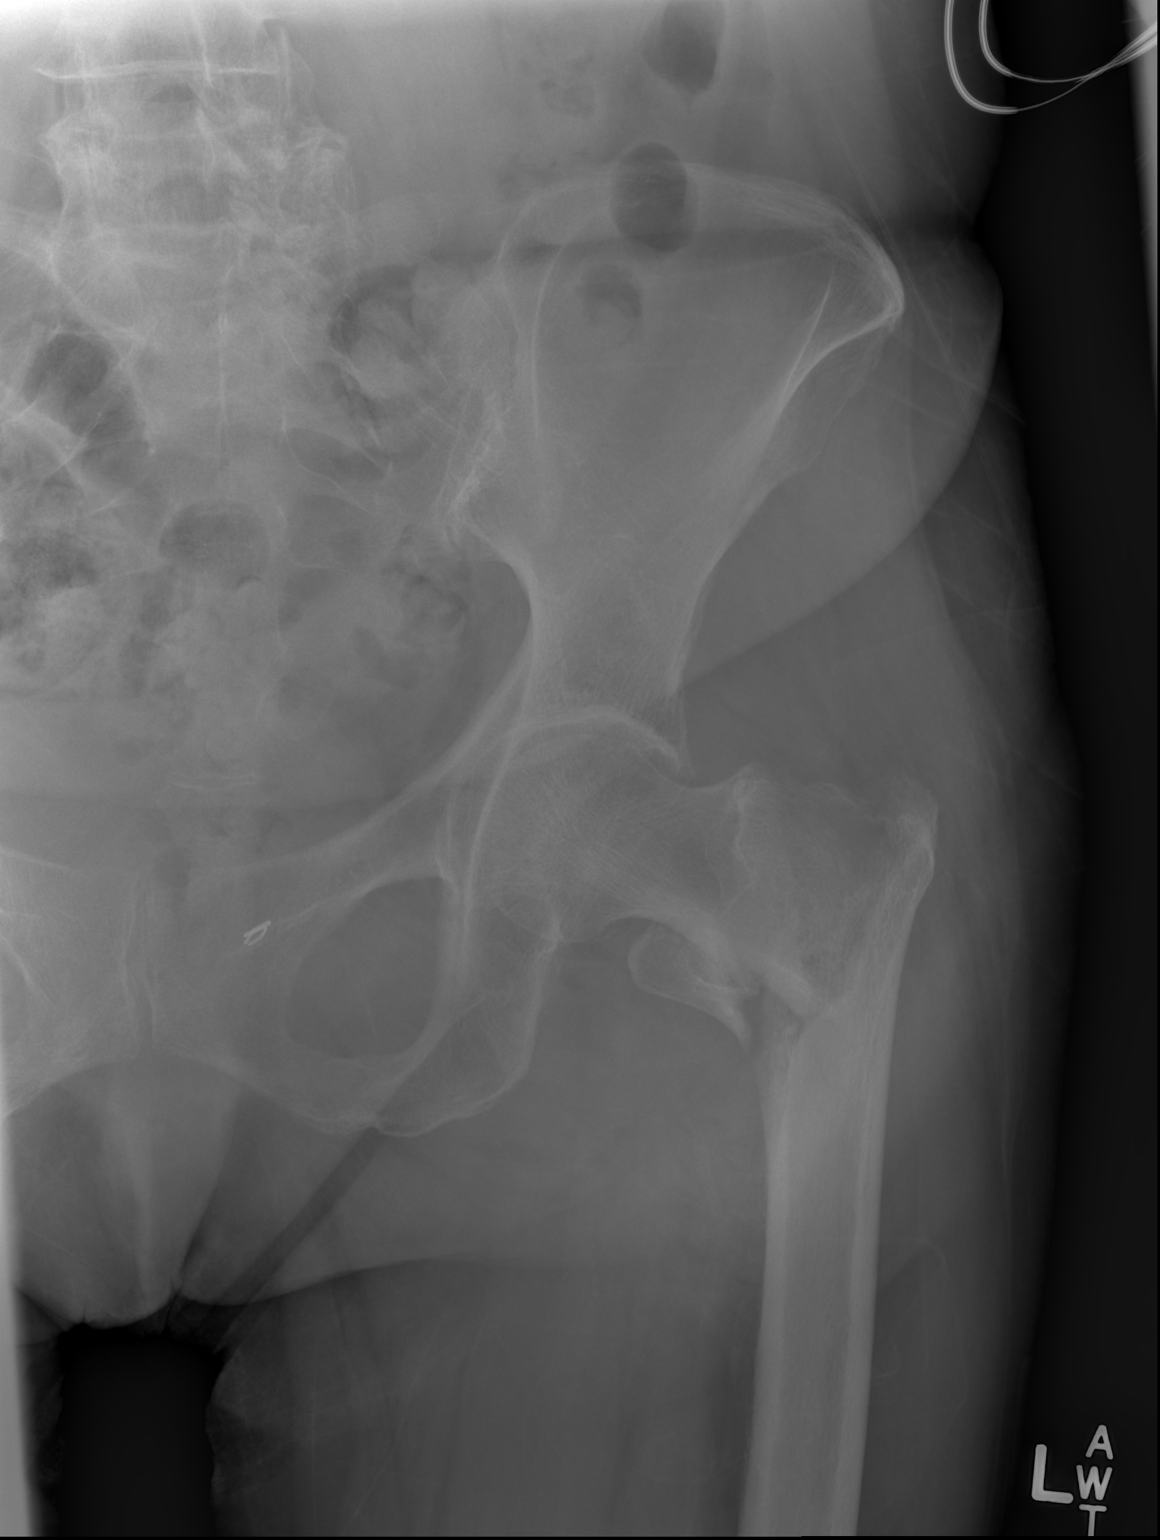

[w hip lat left]
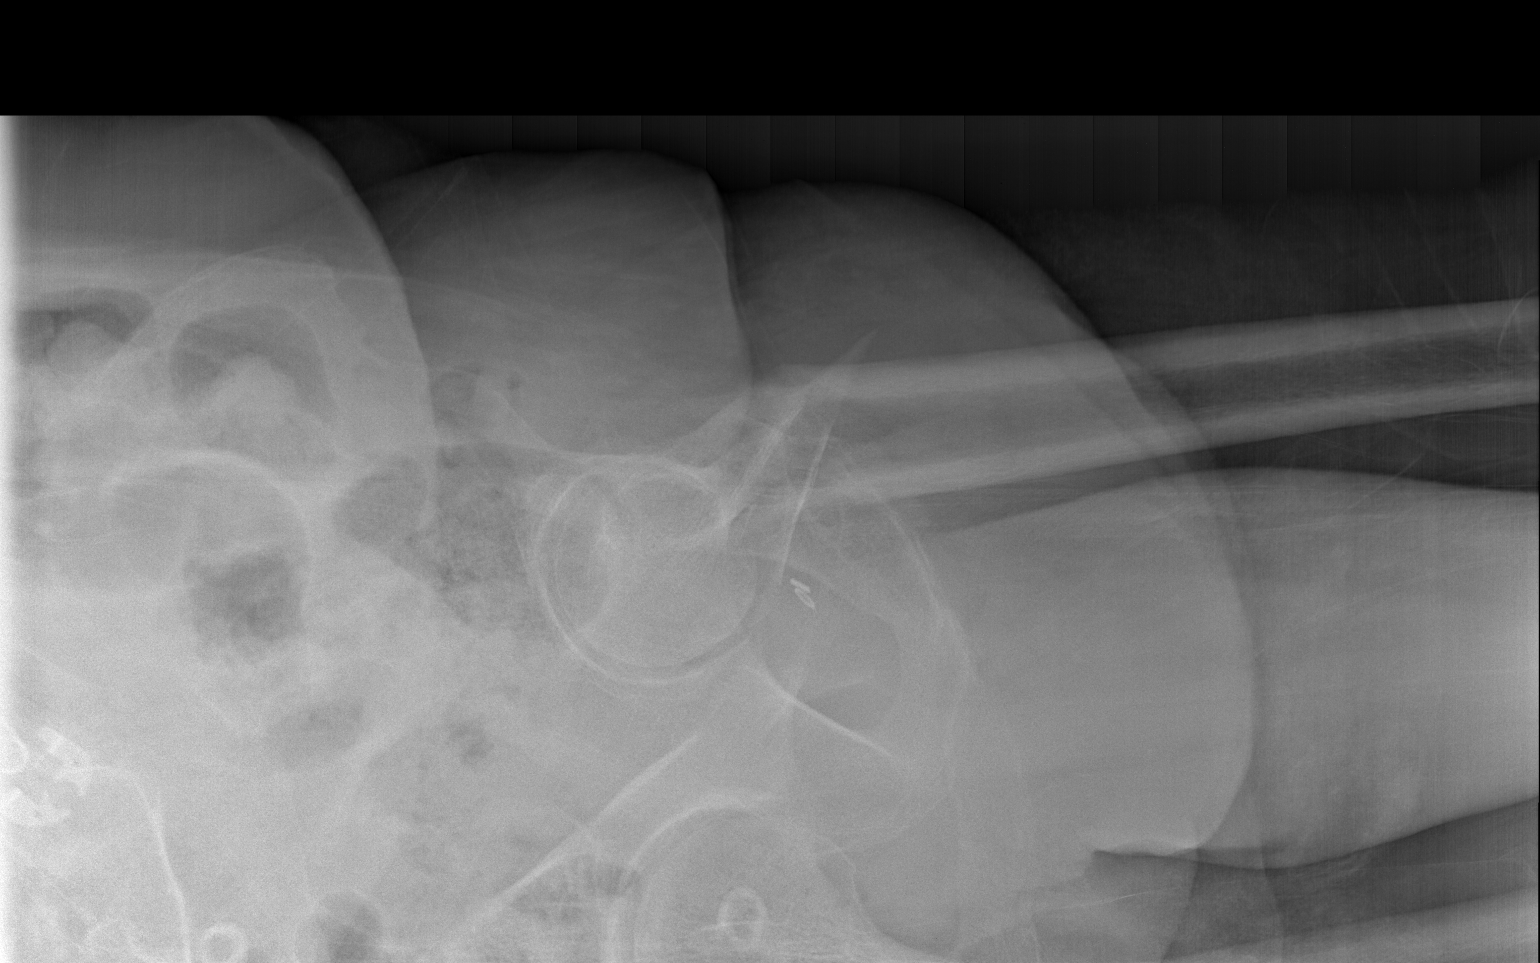

[3 of 3 positions shown; findings below may reference images not displayed]

IMPRESSION: Acute fracture inter trochanteric region of the left
femur with

varus angulation.
FINDINGS: Acute comminuted posttraumatic fracture of the inter trochanteric
region of the left humerus with varus angulation of the fracture
fragments and mild displacement of the lesser trochanteric fragment.
No dislocation of the hip. Mild degenerative changes demonstrated in
the right hip. Pelvis appears intact. SI joints and symphysis pubis
are not displaced.
IMPRESSION: Acute fracture inter trochanteric region of the left humerus with
varus angulation.

## 2017-08-10 IMAGING — RF DG HIP (WITH PELVIS) OPERATIVE*L*
1 series · 3 of 3 positions shown · non-contrast
Comparison: 04/19/2015

CLINICAL DATA: Operative imaging during the ORIF of an
intertrochanteric left proximal femur fracture.

EXAM:
DG C-ARM 1-60 MIN - [facility]PT [facility]; OPERATIVE LEFT HIP WITH PELVIS

[Series 1: run · 3 of 3 slices shown]
[im 1/3]
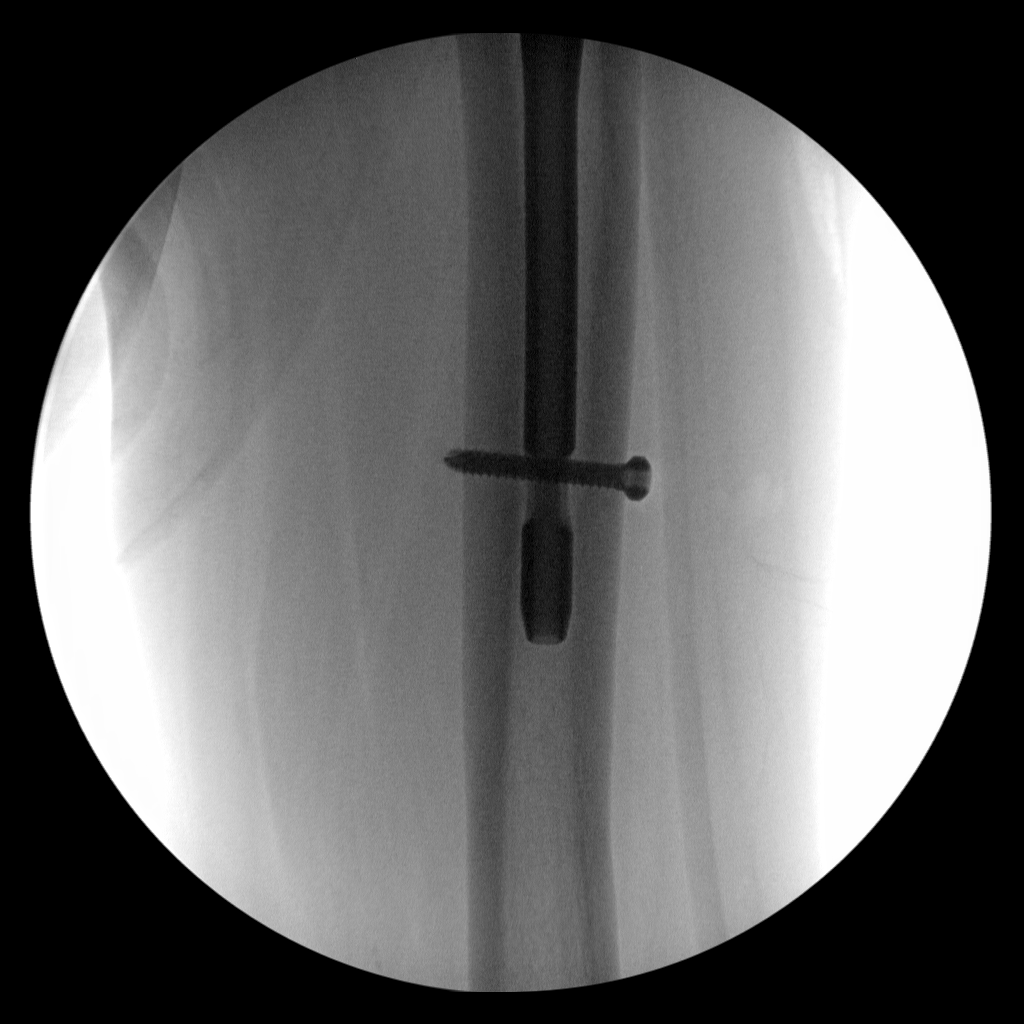
[im 2/3]
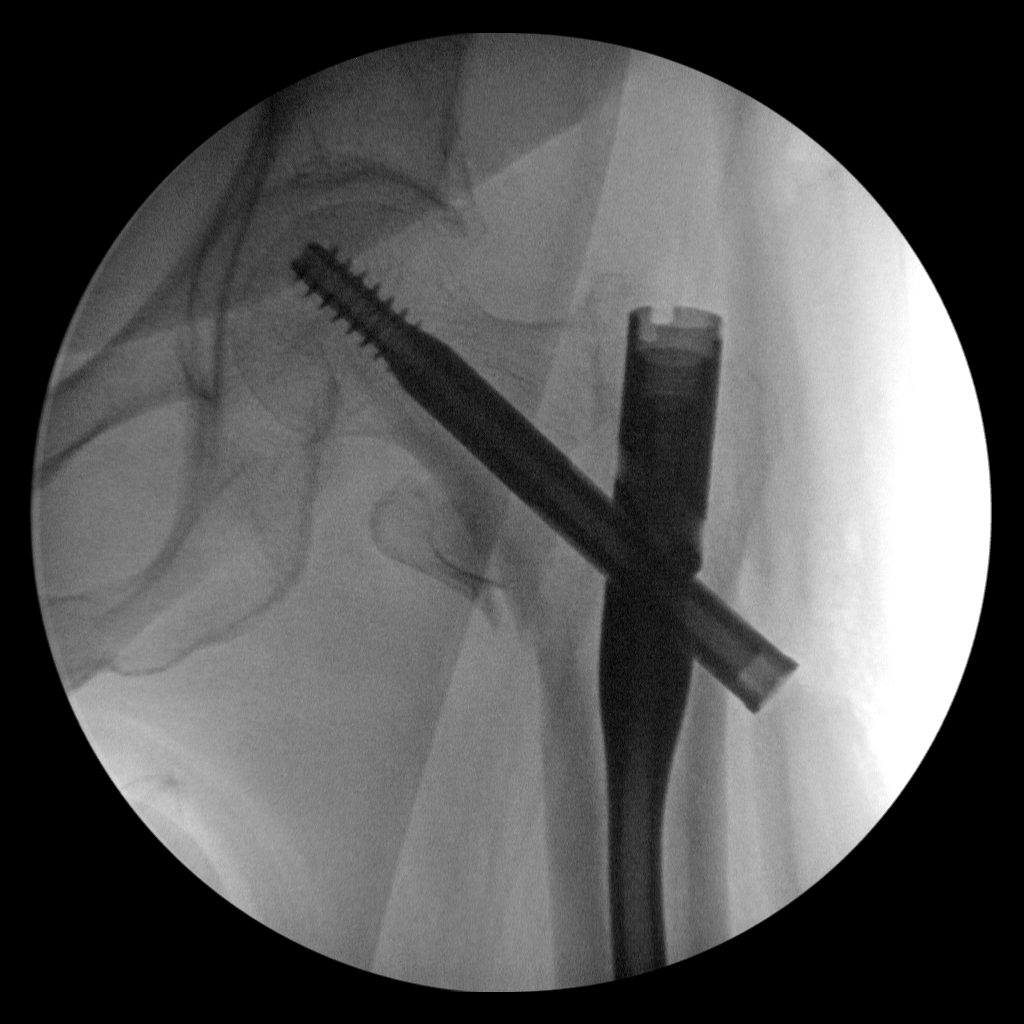
[im 3/3]
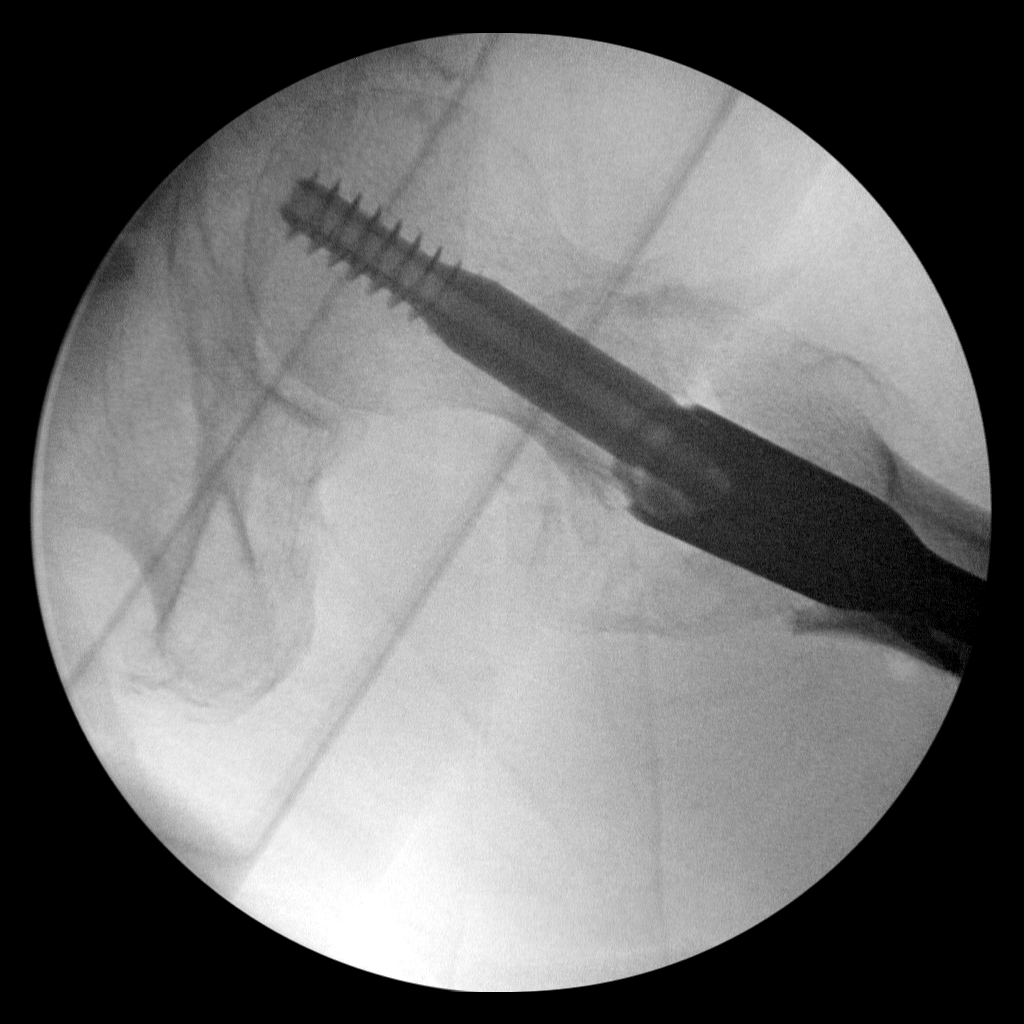

[3 of 3 positions shown; findings below may reference images not displayed]

FLUOROSCOPY TIME:  If the device does not provide the exposure
index:

Fluoroscopy Time:  0 minutes and 54 seconds

Number of Acquired Images:  3
FINDINGS: Three images show placement of an intra medullary rod supporting a
compression screw, reducing the major fracture components into near
anatomic alignment. There is no residual angulation.

Orthopedic hardware is well-seated. There is no acute fracture or
evidence of an operative complication.
IMPRESSION: Well aligned major fracture fragments following ORIF of the
displaced comminuted intertrochanteric left proximal femur fracture.

## 2021-08-25 DEATH — deceased
